# Patient Record
Sex: Male | Born: 1985 | Race: White | Hispanic: No | Smoking: Never smoker
Health system: Southern US, Academic
[De-identification: ages and names within clinical notes are randomized; demographics above are authoritative.]

## PROBLEM LIST (undated history)

## (undated) DIAGNOSIS — E782 Mixed hyperlipidemia: Secondary | ICD-10-CM

## (undated) DIAGNOSIS — F411 Generalized anxiety disorder: Secondary | ICD-10-CM

## (undated) DIAGNOSIS — F209 Schizophrenia, unspecified: Secondary | ICD-10-CM

## (undated) DIAGNOSIS — F319 Bipolar disorder, unspecified: Secondary | ICD-10-CM

## (undated) DIAGNOSIS — R4183 Borderline intellectual functioning: Secondary | ICD-10-CM

## (undated) DIAGNOSIS — K219 Gastro-esophageal reflux disease without esophagitis: Secondary | ICD-10-CM

## (undated) DIAGNOSIS — F259 Schizoaffective disorder, unspecified: Secondary | ICD-10-CM

## (undated) DIAGNOSIS — R569 Unspecified convulsions: Secondary | ICD-10-CM

---
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
## 2021-07-29 ENCOUNTER — Ambulatory Visit (HOSPITAL_COMMUNITY)
Admission: EM | Admit: 2021-07-29 | Discharge: 2021-07-30 | Disposition: A | Discharge status: Other Acute Inpatient Hospital | Payer: No Typology Code available for payment source | Attending: Student | Admitting: Student

## 2021-07-29 DIAGNOSIS — F603 Borderline personality disorder: Secondary | ICD-10-CM | POA: Insufficient documentation

## 2021-07-29 DIAGNOSIS — Z79899 Other long term (current) drug therapy: Secondary | ICD-10-CM | POA: Insufficient documentation

## 2021-07-29 DIAGNOSIS — K5900 Constipation, unspecified: Secondary | ICD-10-CM | POA: Insufficient documentation

## 2021-07-29 DIAGNOSIS — X58XXXA Exposure to other specified factors, initial encounter: Secondary | ICD-10-CM | POA: Insufficient documentation

## 2021-07-29 DIAGNOSIS — F419 Anxiety disorder, unspecified: Secondary | ICD-10-CM | POA: Insufficient documentation

## 2021-07-29 DIAGNOSIS — F323 Major depressive disorder, single episode, severe with psychotic features: Secondary | ICD-10-CM | POA: Insufficient documentation

## 2021-07-29 DIAGNOSIS — R45850 Homicidal ideations: Secondary | ICD-10-CM | POA: Insufficient documentation

## 2021-07-29 DIAGNOSIS — K219 Gastro-esophageal reflux disease without esophagitis: Secondary | ICD-10-CM | POA: Insufficient documentation

## 2021-07-29 DIAGNOSIS — Z560 Unemployment, unspecified: Secondary | ICD-10-CM | POA: Insufficient documentation

## 2021-07-29 DIAGNOSIS — R52 Pain, unspecified: Secondary | ICD-10-CM | POA: Insufficient documentation

## 2021-07-29 DIAGNOSIS — G4700 Insomnia, unspecified: Secondary | ICD-10-CM | POA: Insufficient documentation

## 2021-07-29 DIAGNOSIS — E7800 Pure hypercholesterolemia, unspecified: Secondary | ICD-10-CM | POA: Insufficient documentation

## 2021-07-29 DIAGNOSIS — Z9151 Personal history of suicidal behavior: Secondary | ICD-10-CM | POA: Insufficient documentation

## 2021-07-29 DIAGNOSIS — E559 Vitamin D deficiency, unspecified: Secondary | ICD-10-CM | POA: Insufficient documentation

## 2021-07-29 DIAGNOSIS — R45851 Suicidal ideations: Secondary | ICD-10-CM | POA: Insufficient documentation

## 2021-07-29 DIAGNOSIS — T189XXA Foreign body of alimentary tract, part unspecified, initial encounter: Secondary | ICD-10-CM | POA: Insufficient documentation

## 2021-07-29 DIAGNOSIS — K30 Functional dyspepsia: Secondary | ICD-10-CM | POA: Insufficient documentation

## 2021-07-29 DIAGNOSIS — Z20822 Contact with and (suspected) exposure to covid-19: Secondary | ICD-10-CM | POA: Insufficient documentation

## 2021-07-29 NOTE — Progress Notes (Signed)
°   07/29/21 2251  [facility] Triage Screening (Walk-ins at [facility] only)  How Did You Hear About [place]? Legal System  What Is the Reason for Your Visit/Call Today? Louis Turner is a 36 yo male transported to [facility] by [employer] for evaluation of suicidal ideation, homicidal ideation, and hearing voices telling him to kill others, eat them, then kill himself.  Pt reports that he has been having these thoughts and has plans of cutting his throat, jumping in front of a car.  Pt reports that the symptoms have escalated in the past couple of hours.  Pt denies any current substance use.  How Long Has This Been Causing You Problems? <Week  Have You Recently Had Any Thoughts About Hurting Yourself? Yes  How long ago did you have thoughts about hurting yourself? today--with plans to cut my throat or jump in traffic  Are You Planning to Commit Suicide/Harm Yourself At This time? Yes  Have you Recently Had Thoughts About Hurting Someone Karolee Ohs? Yes  How long ago did you have thoughts of harming others? today--"voices are telling me to kill and eat other people"  Are You Planning To Harm Someone At This Time? Yes  Are you currently experiencing any auditory, visual or other hallucinations? Yes  Please explain the hallucinations you are currently experiencing: auditory: command hallucinations telling patient to kill others, eat them, kill himself  Have You Used Any Alcohol or Drugs in the Past 24 Hours? No  Do you have any current medical co-morbidities that require immediate attention? No  What Do You Feel Would Help You the Most Today? Treatment for Depression or other mood problem  If access to [facility] was not available, would you have sought care in the Emergency Department? Yes  Determination of Need Emergent (2 hours)  Options For Referral Inpatient Hospitalization;BH Urgent Care;Group Home

## 2021-07-29 NOTE — BH Assessment (Signed)
Regarding Kassen Fine:   @ 2245, Received a call from [place] provider Virgel Gess [place]) [phone] (Address: [address], [place], [place]). States that Durrel Ostrom is in route to the [facility], being transported by [employer]. Explains that patient has been living in his AFL, 7 days. Kristopher assumed care of patient, after he was discharged from [place] is concerned that Aditya was not prescribed any "behavior medications" when discharged from [place] (7 days ago). It's unclear if patient has a local provider to manage his medications.   Today, patient called 911 from the [facility], told the dispatcher that he was suicidal with plan, refused to disclose plan when asked, abruptly hung up the phone as the dispatcher tried to ask more questions. Patient then nonchalantly stated, "I'm going to bed". Because patient hung up on the dispatcher, police were dispatched to the home to complete a welfare check, after talking to patient decided that it was best to bring him to the [facility].   The AFL provider states, "I don't think patient is at all suicidal, he is testing me, he is new, still adjusting, possible attention seeking behavior to being in a new environment." The provider has no issues picking patient up and taking him back to the AFL when psych cleared.  States that he sent all of patient's medications to the [facility] with patient/[employer]. The provider welcomes any phone calls for additional questions. Currently implementing a safety plan for patient's return back to the AFL.

## 2021-07-30 ENCOUNTER — Other Ambulatory Visit: Payer: Self-pay

## 2021-07-30 ENCOUNTER — Emergency Department (HOSPITAL_COMMUNITY)
Admission: EM | Admit: 2021-07-30 | Discharge: 2021-07-30 | Payer: Medicaid Other | Attending: Emergency Medicine | Admitting: Emergency Medicine

## 2021-07-30 ENCOUNTER — Emergency Department (HOSPITAL_COMMUNITY): Payer: Medicaid Other

## 2021-07-30 ENCOUNTER — Encounter (HOSPITAL_COMMUNITY): Payer: Self-pay | Admitting: Emergency Medicine

## 2021-07-30 DIAGNOSIS — Z20822 Contact with and (suspected) exposure to covid-19: Secondary | ICD-10-CM | POA: Diagnosis not present

## 2021-07-30 DIAGNOSIS — Z91040 Latex allergy status: Secondary | ICD-10-CM | POA: Insufficient documentation

## 2021-07-30 DIAGNOSIS — R079 Chest pain, unspecified: Secondary | ICD-10-CM | POA: Insufficient documentation

## 2021-07-30 DIAGNOSIS — R45851 Suicidal ideations: Secondary | ICD-10-CM | POA: Diagnosis not present

## 2021-07-30 DIAGNOSIS — T189XXA Foreign body of alimentary tract, part unspecified, initial encounter: Secondary | ICD-10-CM

## 2021-07-30 DIAGNOSIS — Z79899 Other long term (current) drug therapy: Secondary | ICD-10-CM | POA: Insufficient documentation

## 2021-07-30 LAB — CBC
HCT: 45.2 % (ref 39.0–52.0)
Hemoglobin: 14.9 g/dL (ref 13.0–17.0)
MCH: 28.6 pg (ref 26.0–34.0)
MCHC: 33 g/dL (ref 30.0–36.0)
MCV: 86.8 fL (ref 80.0–100.0)
Platelets: 269 10*3/uL (ref 150–400)
RBC: 5.21 MIL/uL (ref 4.22–5.81)
RDW: 13.2 % (ref 11.5–15.5)
WBC: 6.1 10*3/uL (ref 4.0–10.5)
nRBC: 0 % (ref 0.0–0.2)

## 2021-07-30 LAB — COMPREHENSIVE METABOLIC PANEL
ALT: 28 U/L (ref 0–44)
AST: 23 U/L (ref 15–41)
Albumin: 3.6 g/dL (ref 3.5–5.0)
Alkaline Phosphatase: 76 U/L (ref 38–126)
Anion gap: 9 (ref 5–15)
BUN: 13 mg/dL (ref 6–20)
CO2: 26 mmol/L (ref 22–32)
Calcium: 9.3 mg/dL (ref 8.9–10.3)
Chloride: 107 mmol/L (ref 98–111)
Creatinine, Ser: 0.93 mg/dL (ref 0.61–1.24)
GFR, Estimated: >60 mL/min (ref >60)
Glucose, Bld: 132 mg/dL — ABNORMAL HIGH (ref 70–99)
Potassium: 3.3 mmol/L — ABNORMAL LOW (ref 3.5–5.1)
Sodium: 142 mmol/L (ref 135–145)
Total Bilirubin: 0.2 mg/dL — ABNORMAL LOW (ref 0.3–1.2)
Total Protein: 6.4 g/dL — ABNORMAL LOW (ref 6.5–8.1)

## 2021-07-30 LAB — ETHANOL
Alcohol, Ethyl (B): <10 mg/dL (ref <10)
Alcohol, Ethyl (B): <10 mg/dL (ref <10)

## 2021-07-30 LAB — CBC WITH DIFFERENTIAL/PLATELET
Abs Immature Granulocytes: 0.02 10*3/uL (ref 0.00–0.07)
Basophils Absolute: 0 10*3/uL (ref 0.0–0.1)
Basophils Relative: 0 %
Eosinophils Absolute: 0 10*3/uL (ref 0.0–0.5)
Eosinophils Relative: 1 %
HCT: 41.8 % (ref 39.0–52.0)
Hemoglobin: 13.7 g/dL (ref 13.0–17.0)
Immature Granulocytes: 0 %
Lymphocytes Relative: 50 %
Lymphs Abs: 2.9 10*3/uL (ref 0.7–4.0)
MCH: 28.6 pg (ref 26.0–34.0)
MCHC: 32.8 g/dL (ref 30.0–36.0)
MCV: 87.3 fL (ref 80.0–100.0)
Monocytes Absolute: 0.4 10*3/uL (ref 0.1–1.0)
Monocytes Relative: 6 %
Neutro Abs: 2.6 10*3/uL (ref 1.7–7.7)
Neutrophils Relative %: 43 %
Platelets: 228 10*3/uL (ref 150–400)
RBC: 4.79 MIL/uL (ref 4.22–5.81)
RDW: 13.2 % (ref 11.5–15.5)
WBC: 5.9 10*3/uL (ref 4.0–10.5)
nRBC: 0 % (ref 0.0–0.2)

## 2021-07-30 LAB — POCT URINE DRUG SCREEN - MANUAL ENTRY (I-SCREEN)
POC Amphetamine UR: NOT DETECTED
POC Buprenorphine (BUP): NOT DETECTED
POC Cocaine UR: NOT DETECTED
POC Marijuana UR: NOT DETECTED
POC Methadone UR: NOT DETECTED
POC Methamphetamine UR: NOT DETECTED
POC Morphine: NOT DETECTED
POC Oxazepam (BZO): NOT DETECTED
POC Oxycodone UR: NOT DETECTED
POC Secobarbital (BAR): NOT DETECTED

## 2021-07-30 LAB — BASIC METABOLIC PANEL
Anion gap: 10 (ref 5–15)
BUN: 11 mg/dL (ref 6–20)
CO2: 23 mmol/L (ref 22–32)
Calcium: 9.5 mg/dL (ref 8.9–10.3)
Chloride: 106 mmol/L (ref 98–111)
Creatinine, Ser: 0.89 mg/dL (ref 0.61–1.24)
GFR, Estimated: >60 mL/min (ref >60)
Glucose, Bld: 118 mg/dL — ABNORMAL HIGH (ref 70–99)
Potassium: 4.2 mmol/L (ref 3.5–5.1)
Sodium: 139 mmol/L (ref 135–145)

## 2021-07-30 LAB — RESP PANEL BY RT-PCR (FLU A&B, COVID) ARPGX2
Influenza A by PCR: NEGATIVE
Influenza A by PCR: NEGATIVE
Influenza B by PCR: NEGATIVE
Influenza B by PCR: NEGATIVE
SARS Coronavirus 2 by RT PCR: NEGATIVE
SARS Coronavirus 2 by RT PCR: NEGATIVE

## 2021-07-30 LAB — LIPID PANEL
Cholesterol: 144 mg/dL (ref 0–200)
HDL: 33 mg/dL — ABNORMAL LOW (ref >40)
LDL Cholesterol: 38 mg/dL (ref 0–99)
Total CHOL/HDL Ratio: 4.4 RATIO
Triglycerides: 365 mg/dL — ABNORMAL HIGH (ref <150)
VLDL: 73 mg/dL — ABNORMAL HIGH (ref 0–40)

## 2021-07-30 LAB — VALPROIC ACID LEVEL: Valproic Acid Lvl: 72 ug/mL (ref 50.0–100.0)

## 2021-07-30 LAB — HEMOGLOBIN A1C
Hgb A1c MFr Bld: 5.4 % (ref 4.8–5.6)
Mean Plasma Glucose: 108.28 mg/dL

## 2021-07-30 LAB — POC SARS CORONAVIRUS 2 AG: SARSCOV2ONAVIRUS 2 AG: NEGATIVE

## 2021-07-30 LAB — TROPONIN I (HIGH SENSITIVITY): Troponin I (High Sensitivity): 3 ng/L (ref <18)

## 2021-07-30 LAB — TSH: TSH: 2.401 u[IU]/mL (ref 0.350–4.500)

## 2021-07-30 LAB — POC SARS CORONAVIRUS 2 AG -  ED: SARS Coronavirus 2 Ag: NEGATIVE

## 2021-07-30 MED ORDER — VITAMIN D3 50 MCG (2000 UT) PO TABS: 2000.0000 [IU] | ORAL_TABLET | Freq: Every day | ORAL | Status: DC

## 2021-07-30 MED ORDER — POTASSIUM CHLORIDE CRYS ER 20 MEQ PO TBCR
40.0000 meq | EXTENDED_RELEASE_TABLET | Freq: Once | ORAL | Status: AC
  Administered 2021-07-30: 40 meq via ORAL
  Filled 2021-07-30: qty 2

## 2021-07-30 MED ORDER — POTASSIUM CHLORIDE CRYS ER 20 MEQ PO TBCR: 20.0000 meq | EXTENDED_RELEASE_TABLET | Freq: Once | ORAL | Status: DC

## 2021-07-30 MED ORDER — PANTOPRAZOLE SODIUM 40 MG PO TBEC
80.0000 mg | DELAYED_RELEASE_TABLET | Freq: Every day | ORAL | Status: DC
  Administered 2021-07-30: 80 mg via ORAL
  Filled 2021-07-30: qty 2

## 2021-07-30 MED ORDER — TRAZODONE HCL 50 MG PO TABS
50.0000 mg | ORAL_TABLET | Freq: Every day | ORAL | Status: DC
Start: 2021-07-30 — End: 2021-07-30

## 2021-07-30 MED ORDER — ALUM & MAG HYDROXIDE-SIMETH 200-200-20 MG/5ML PO SUSP: 30.0000 mL | ORAL | Status: DC | PRN

## 2021-07-30 MED ORDER — ACETAMINOPHEN 325 MG PO TABS: 650.0000 mg | ORAL_TABLET | Freq: Four times a day (QID) | ORAL | Status: DC | PRN

## 2021-07-30 MED ORDER — DIVALPROEX SODIUM ER 500 MG PO TB24
500.0000 mg | ORAL_TABLET | Freq: Every day | ORAL | Status: DC
Start: 2021-07-30 — End: 2021-07-30
  Administered 2021-07-30: 500 mg via ORAL
  Filled 2021-07-30: qty 1

## 2021-07-30 MED ORDER — ALUM & MAG HYDROXIDE-SIMETH 200-200-20 MG/5ML PO SUSP
30.0000 mL | Freq: Once | ORAL | Status: AC
  Administered 2021-07-30: 30 mL via ORAL
  Filled 2021-07-30: qty 30

## 2021-07-30 MED ORDER — VITAMIN D 25 MCG (1000 UNIT) PO TABS
2000.0000 [IU] | ORAL_TABLET | Freq: Every day | ORAL | Status: DC
  Administered 2021-07-30: 2000 [IU] via ORAL
  Filled 2021-07-30: qty 2

## 2021-07-30 MED ORDER — BENZTROPINE MESYLATE 1 MG PO TABS
1.0000 mg | ORAL_TABLET | Freq: Two times a day (BID) | ORAL | Status: DC
  Administered 2021-07-30: 1 mg via ORAL
  Filled 2021-07-30: qty 1

## 2021-07-30 MED ORDER — CLONAZEPAM 1 MG PO TABS
1.0000 mg | ORAL_TABLET | Freq: Two times a day (BID) | ORAL | Status: DC
  Administered 2021-07-30: 1 mg via ORAL
  Filled 2021-07-30: qty 1

## 2021-07-30 MED ORDER — DIVALPROEX SODIUM ER 500 MG PO TB24: 1000.0000 mg | ORAL_TABLET | Freq: Every day | ORAL | Status: DC

## 2021-07-30 MED ORDER — MAGNESIUM HYDROXIDE 400 MG/5ML PO SUSP: 30.0000 mL | Freq: Every day | ORAL | Status: DC | PRN

## 2021-07-30 MED ORDER — LIDOCAINE VISCOUS HCL 2 % MT SOLN
15.0000 mL | Freq: Once | OROMUCOSAL | Status: AC
  Administered 2021-07-30: 15 mL via ORAL
  Filled 2021-07-30: qty 15

## 2021-07-30 MED ORDER — HYDROXYZINE HCL 25 MG PO TABS
100.0000 mg | ORAL_TABLET | Freq: Every day | ORAL | Status: DC
Start: 2021-07-30 — End: 2021-07-30

## 2021-07-30 MED ORDER — ATORVASTATIN CALCIUM 10 MG PO TABS: 20.0000 mg | ORAL_TABLET | Freq: Every day | ORAL | Status: DC

## 2021-07-30 NOTE — ED Provider Notes (Signed)
Behavioral Health Progress Note  Date and Time: 07/30/2021 6:41 PM Name: Louis Turner MRN:  [id number]  Subjective:   Louis Turner is a 36 year old male with reported past psychiatric history of borderline personality disorder (reported by patient's ALF provider, no past psychiatric diagnoses present in patient's chart at this time) who presents to the [facility] behavioral health urgent care [facility]) from his ALF via [place] enforcement voluntarily for walk-in evaluation.  Patient is currently a resident at an [place] [facility] (Address: [address], [place], [place]) and has a legal guardian Claria Dice ([place]): [phone]). He presented for command auditory hallucinations and suicidal thoughts.  On interview and assessment this morning, the patient has a bizarre appearance with a depressed affect.  He is able to report that he lives in an assisted living facility, being there for the past week.  He reports liking where he lives.  Unfortunately, he reports that he has command auditory hallucinations that make him homicidal and suicidal.  He reports that these take the form of voices of people he knows telling him to "burn down the house, kill yourself, and kill other people".  He reports the medications do not work for him.  He reports a recent hospitalization at [facility], which the patient reports was only marginally helpful.  The patient is fully alert and oriented but he is unable to solve a simple math problem.  Diagnosis:  Final diagnoses:  MDD (major depressive disorder), single episode, severe with psychotic features ([place])  Suicidal ideation  Homicidal ideation    Total Time spent with patient: 20 minutes  Past Psychiatric History: reported past psychiatric history of borderline personality disorder (reported by patient's ALF provider, no past psychiatric diagnoses present in patient's chart at this time).  See HPI for further details regarding patient's past psychiatric  history. Past Medical History: History reviewed. No pertinent past medical history. History reviewed. No pertinent surgical history. Family History: History reviewed. No pertinent family history. Family Psychiatric  History: denies Hx of major mental illness Social History:  Social History   Substance and Sexual Activity  Alcohol Use None     Social History   Substance and Sexual Activity  Drug Use Not on file    Social History   Socioeconomic History   Marital status: Single    Spouse name: Not on file   Number of children: Not on file   Years of education: Not on file   Highest education level: Not on file  Occupational History   Not on file  Tobacco Use   Smoking status: Unknown   Smokeless tobacco: Not on file  Substance and Sexual Activity   Alcohol use: Not on file   Drug use: Not on file   Sexual activity: Not on file  Other Topics Concern   Not on file  Social History Narrative   Not on file   Social Determinants of Health   Financial Resource Strain: Not on file  Food Insecurity: Not on file  Transportation Needs: Not on file  Physical Activity: Not on file  Stress: Not on file  Social Connections: Not on file   SDOH:  SDOH Screenings   Alcohol Screen: Not on file  Depression [id number]): Not on file  Financial Resource Strain: Not on file  Food Insecurity: Not on file  Housing: Not on file  Physical Activity: Not on file  Social Connections: Not on file  Stress: Not on file  Tobacco Use: Not on file  Transportation Needs: Not on file  Additional Social History:    Pain Medications: see MAR Prescriptions: see MAR Over the Counter: see MAR History of alcohol / drug use?: No history of alcohol / drug abuse      Sleep: Fair  Appetite:  Fair  Current Medications:  Current Facility-Administered Medications  Medication Dose Route Frequency Provider Last Rate Last Admin   acetaminophen (TYLENOL) tablet 650 mg  650 mg Oral Q6H PRN Jaclyn Shaggy, PA-C       alum & mag hydroxide-simeth (MAALOX/MYLANTA) 200-200-20 MG/5ML suspension 30 mL  30 mL Oral Q4H PRN Melbourne Abts W, PA-C       atorvastatin (LIPITOR) tablet 20 mg  20 mg Oral QHS Ladona Ridgel, Cody W, PA-C       benztropine (COGENTIN) tablet 1 mg  1 mg Oral BID Melbourne Abts W, PA-C   1 mg at 07/30/21 1105   cholecalciferol (VITAMIN D3) tablet 2,000 Units  2,000 Units Oral Daily Jaclyn Shaggy, PA-C   2,000 Units at 07/30/21 1104   clonazePAM (KLONOPIN) tablet 1 mg  1 mg Oral BID Melbourne Abts W, PA-C   1 mg at 07/30/21 1105   divalproex (DEPAKOTE ER) 24 hr tablet 1,000 mg  1,000 mg Oral QHS Ladona Ridgel, Cody W, PA-C       divalproex (DEPAKOTE ER) 24 hr tablet 500 mg  500 mg Oral Daily Melbourne Abts W, PA-C   500 mg at 07/30/21 1548   hydrOXYzine (ATARAX) tablet 100 mg  100 mg Oral QHS Melbourne Abts W, PA-C       magnesium hydroxide (MILK OF MAGNESIA) suspension 30 mL  30 mL Oral Daily PRN Melbourne Abts W, PA-C       pantoprazole (PROTONIX) EC tablet 80 mg  80 mg Oral Daily Melbourne Abts W, PA-C   80 mg at 07/30/21 1104   traZODone (DESYREL) tablet 50 mg  50 mg Oral QHS Jaclyn Shaggy, PA-C       Current Outpatient Medications  Medication Sig Dispense Refill   Cholecalciferol (VITAMIN D3) 50 MCG (2000 UT) TABS Take 2,000 Int'l Units by mouth daily.     divalproex (DEPAKOTE ER) 500 MG 24 hr tablet Take 1,000 mg by mouth at bedtime.     Melatonin 10 MG TABS Take 10 mg by mouth at bedtime.     atorvastatin (LIPITOR) 20 MG tablet Take 20 mg by mouth at bedtime.     benztropine (COGENTIN) 1 MG tablet Take 1 mg by mouth 2 (two) times daily.     clonazePAM (KLONOPIN) 1 MG tablet Take 1 mg by mouth 2 (two) times daily.     divalproex (DEPAKOTE ER) 500 MG 24 hr tablet Take 500 mg by mouth daily.     hydrOXYzine (ATARAX) 50 MG tablet Take 100 mg by mouth at bedtime.     omeprazole (PRILOSEC) 40 MG capsule Take 40 mg by mouth every morning.     traZODone (DESYREL) 50 MG tablet Take 50 mg by mouth at  bedtime.      Labs  Lab Results:  Admission on 07/29/2021  Component Date Value Ref Range Status   SARS Coronavirus 2 by RT PCR 07/30/2021 NEGATIVE  NEGATIVE Final   Comment: (NOTE) SARS-CoV-2 target nucleic acids are NOT DETECTED.  The SARS-CoV-2 RNA is generally detectable in upper respiratory specimens during the acute phase of infection. The lowest concentration of SARS-CoV-2 viral copies this assay can detect is 138 copies/mL. A negative result does not preclude SARS-Cov-2 infection and should not  be used as the sole basis for treatment or other patient management decisions. A negative result may occur with  improper specimen collection/handling, submission of specimen other than nasopharyngeal swab, presence of viral mutation(s) within the areas targeted by this assay, and inadequate number of viral copies(<138 copies/mL). A negative result must be combined with clinical observations, patient history, and epidemiological information. The expected result is Negative.  Fact Sheet for Patients:  EntrepreneurPulse.com.au  Fact Sheet for Healthcare Providers:  IncredibleEmployment.be  This test is no                          t yet approved or cleared by the [place] FDA and  has been authorized for detection and/or diagnosis of SARS-CoV-2 by FDA under an Emergency Use Authorization (EUA). This EUA will remain  in effect (meaning this test can be used) for the duration of the COVID-19 declaration under Section 564(b)(1) of the Act, 21 U.S.C.section 360bbb-3(b)(1), unless the authorization is terminated  or revoked sooner.       Influenza A by PCR 07/30/2021 NEGATIVE  NEGATIVE Final   Influenza B by PCR 07/30/2021 NEGATIVE  NEGATIVE Final   Comment: (NOTE) The Xpert Xpress SARS-CoV-2/FLU/RSV plus assay is intended as an aid in the diagnosis of influenza from Nasopharyngeal swab specimens and should not be used as a sole basis for  treatment. Nasal washings and aspirates are unacceptable for Xpert Xpress SARS-CoV-2/FLU/RSV testing.  Fact Sheet for Patients: EntrepreneurPulse.com.au  Fact Sheet for Healthcare Providers: IncredibleEmployment.be  This test is not yet approved or cleared by the [place] FDA and has been authorized for detection and/or diagnosis of SARS-CoV-2 by FDA under an Emergency Use Authorization (EUA). This EUA will remain in effect (meaning this test can be used) for the duration of the COVID-19 declaration under Section 564(b)(1) of the Act, 21 U.S.C. section 360bbb-3(b)(1), unless the authorization is terminated or revoked.  Performed at [facility], [place] [address] [place], [place] [address]    SARS Coronavirus 2 Ag 07/30/2021 Negative  Negative Preliminary   WBC 07/30/2021 5.9  4.0 - 10.5 K/uL Final   RBC 07/30/2021 4.79  4.22 - 5.81 MIL/uL Final   Hemoglobin 07/30/2021 13.7  13.0 - 17.0 g/dL Final   HCT 07/30/2021 41.8  39.0 - 52.0 % Final   MCV 07/30/2021 87.3  80.0 - 100.0 fL Final   MCH 07/30/2021 28.6  26.0 - 34.0 pg Final   MCHC 07/30/2021 32.8  30.0 - 36.0 g/dL Final   RDW 07/30/2021 13.2  11.5 - 15.5 % Final   Platelets 07/30/2021 228  150 - 400 K/uL Final   nRBC 07/30/2021 0.0  0.0 - 0.2 % Final   Neutrophils Relative % 07/30/2021 43  % Final   Neutro Abs 07/30/2021 2.6  1.7 - 7.7 K/uL Final   Lymphocytes Relative 07/30/2021 50  % Final   Lymphs Abs 07/30/2021 2.9  0.7 - 4.0 K/uL Final   Monocytes Relative 07/30/2021 6  % Final   Monocytes Absolute 07/30/2021 0.4  0.1 - 1.0 K/uL Final   Eosinophils Relative 07/30/2021 1  % Final   Eosinophils Absolute 07/30/2021 0.0  0.0 - 0.5 K/uL Final   Basophils Relative 07/30/2021 0  % Final   Basophils Absolute 07/30/2021 0.0  0.0 - 0.1 K/uL Final   Immature Granulocytes 07/30/2021 0  % Final   Abs Immature Granulocytes 07/30/2021 0.02  0.00 - 0.07 K/uL Final   Performed  at [facility], [place] [address], [place], [place] [address]   Sodium 07/30/2021 142  135 - 145 mmol/L Final   Potassium 07/30/2021 3.3 (L)  3.5 - 5.1 mmol/L Final   Chloride 07/30/2021 107  98 - 111 mmol/L Final   CO2 07/30/2021 26  22 - 32 mmol/L Final   Glucose, Bld 07/30/2021 132 (H)  70 - 99 mg/dL Final   Glucose reference range applies only to samples taken after fasting for at least 8 hours.   BUN 07/30/2021 13  6 - 20 mg/dL Final   Creatinine, Ser 07/30/2021 0.93  0.61 - 1.24 mg/dL Final   Calcium 07/30/2021 9.3  8.9 - 10.3 mg/dL Final   Total Protein 07/30/2021 6.4 (L)  6.5 - 8.1 g/dL Final   Albumin 07/30/2021 3.6  3.5 - 5.0 g/dL Final   AST 07/30/2021 23  15 - 41 U/L Final   ALT 07/30/2021 28  0 - 44 U/L Final   Alkaline Phosphatase 07/30/2021 76  38 - 126 U/L Final   Total Bilirubin 07/30/2021 0.2 (L)  0.3 - 1.2 mg/dL Final   GFR, Estimated 07/30/2021 >60  >60 mL/min Final   Comment: (NOTE) Calculated using the CKD-EPI Creatinine Equation (2021)    Anion gap 07/30/2021 9  5 - 15 Final   Performed at [facility], [place] [address], [place], [place] [address]   Hgb A1c MFr Bld 07/30/2021 5.4  4.8 - 5.6 % Final   Comment: (NOTE) Pre diabetes:          5.7%-6.4%  Diabetes:              >6.4%  Glycemic control for   <7.0% adults with diabetes    Mean Plasma Glucose 07/30/2021 108.28  mg/dL Final   Performed at [facility], [place] [address], [place], [place] [address]   Alcohol, Ethyl (B) 07/30/2021 <10  <10 mg/dL Final   Comment: (NOTE) Lowest detectable limit for serum alcohol is 10 mg/dL.  For medical purposes only. Performed at [facility], [place] [address] [place], [place] [address]    Cholesterol 07/30/2021 144  0 - 200 mg/dL Final   Triglycerides 07/30/2021 365 (H)  <150 mg/dL Final   HDL 07/30/2021 33 (L)  >40 mg/dL Final   Total CHOL/HDL Ratio 07/30/2021 4.4  RATIO Final   VLDL 07/30/2021 73 (H)  0 - 40 mg/dL Final   LDL Cholesterol 07/30/2021  38  0 - 99 mg/dL Final   Comment:        Total Cholesterol/HDL:CHD Risk Coronary Heart Disease Risk Table                     Men   Women  1/2 Average Risk   3.4   3.3  Average Risk       5.0   4.4  2 X Average Risk   9.6   7.1  3 X Average Risk  23.4   11.0        Use the calculated Patient Ratio above and the CHD Risk Table to determine the patient's CHD Risk.        ATP III CLASSIFICATION (LDL):  <100     mg/dL   Optimal  100-129  mg/dL   Near or Above                    Optimal  130-159  mg/dL   Borderline  160-189  mg/dL   High  >190     mg/dL   Very High Performed at [facility] [address] [place], [place] [address]    TSH 07/30/2021 2.401  0.350 - 4.500 uIU/mL Final   Comment: Performed by a 3rd Generation assay with a functional sensitivity of <=0.01 uIU/mL. Performed at [facility], [place] [address] [place], [place] [address]    POC Amphetamine UR 07/30/2021 None Detected  NONE DETECTED (Cut Off Level 1000 ng/mL) Final   POC Secobarbital (BAR) 07/30/2021 None Detected  NONE DETECTED (Cut Off Level 300 ng/mL) Final   POC Buprenorphine (BUP) 07/30/2021 None Detected  NONE DETECTED (Cut Off Level 10 ng/mL) Final   POC Oxazepam (BZO) 07/30/2021 None Detected  NONE DETECTED (Cut Off Level 300 ng/mL) Final   POC Cocaine UR 07/30/2021 None Detected  NONE DETECTED (Cut Off Level 300 ng/mL) Final   POC Methamphetamine UR 07/30/2021 None Detected  NONE DETECTED (Cut Off Level 1000 ng/mL) Final   POC Morphine 07/30/2021 None Detected  NONE DETECTED (Cut Off Level 300 ng/mL) Final   POC Oxycodone UR 07/30/2021 None Detected  NONE DETECTED (Cut Off Level 100 ng/mL) Final   POC Methadone UR 07/30/2021 None Detected  NONE DETECTED (Cut Off Level 300 ng/mL) Final   POC Marijuana UR 07/30/2021 None Detected  NONE DETECTED (Cut Off Level 50 ng/mL) Final   SARSCOV2ONAVIRUS 2 AG 07/30/2021 NEGATIVE  NEGATIVE Final   Comment: (NOTE) SARS-CoV-2 antigen NOT DETECTED.    Negative results are presumptive.  Negative results do not preclude SARS-CoV-2 infection and should not be used as the sole basis for treatment or other patient management decisions, including infection  control decisions, particularly in the presence of clinical signs and  symptoms consistent with COVID-19, or in those who have been in contact with the virus.  Negative results must be combined with clinical observations, patient history, and epidemiological information. The expected result is Negative.  Fact Sheet for Patients: HandmadeRecipes.com.cy  Fact Sheet for Healthcare Providers: FuneralLife.at  This test is not yet approved or cleared by the [place] FDA and  has been authorized for detection and/or diagnosis of SARS-CoV-2 by FDA under an Emergency Use Authorization (EUA).  This EUA will remain in effect (meaning this test can be used) for the duration of  the COV                          ID-19 declaration under Section 564(b)(1) of the Act, 21 U.S.C. section 360bbb-3(b)(1), unless the authorization is terminated or revoked sooner.     Valproic Acid Lvl 07/30/2021 72  50.0 - 100.0 ug/mL Final   Performed at [facility], [place] [address], [place],  [address]    Blood Alcohol level:  Lab Results  Component Value Date   ETH <10 123XX123    Metabolic Disorder Labs: Lab Results  Component Value Date   HGBA1C 5.4 07/30/2021   MPG 108.28 07/30/2021   No results found for: PROLACTIN Lab Results  Component Value Date   CHOL 144 07/30/2021   TRIG 365 (H) 07/30/2021   HDL 33 (L) 07/30/2021   CHOLHDL 4.4 07/30/2021   VLDL 73 (H) 07/30/2021   LDLCALC 38 07/30/2021    Therapeutic Lab Levels: No results found for: LITHIUM Lab Results  Component Value Date   VALPROATE 72 07/30/2021   No components found for:  CBMZ  Physical Findings  [place] [facility] from 07/29/2021 in [facility]        Musculoskeletal  Strength & Muscle Tone: within normal limits Gait & Station: normal Patient leans: N/A  Psychiatric Specialty Exam  Presentation  General Appearance: Appropriate for Environment; Bizarre  Eye Contact:Fleeting  Speech:Clear and Coherent  Speech Volume:Normal  Handedness:No data recorded  Mood and Affect  Mood:Depressed  Affect:Congruent   Thought Process  Thought Processes:Coherent  Descriptions of Associations:Intact  Orientation:Full (Time, Place and Person)  Thought Content:Logical   Duration of Psychotic Symptoms: Greater than six months   Hallucinations: as described in subjective above Ideas of Reference:None  Suicidal Thoughts:Suicidal Thoughts: Yes, Active SI Active Intent and/or Plan: Without Intent; With Plan; Without Means to Carry Out  Homicidal Thoughts:Homicidal Thoughts: Yes, Active HI Active Intent and/or Plan: Without Intent; With Plan; Without Means to [place]; Recent Fair; Remote Fair  Judgment:Poor  Insight:Poor   Executive Functions  Concentration:Poor  Attention Span:Poor  Recall:Poor  Fund of Knowledge:Poor  Language:Poor   Psychomotor Activity  Psychomotor Activity:Psychomotor Activity: Normal   Assets  Assets:Social Support; Physical Health; Housing   Sleep  Sleep:Sleep: Fair   Nutritional Assessment (For OBS and FBC admissions only) Has the patient had a weight loss or gain of 10 pounds or more in the last 3 months?: No Has the patient had a decrease in food intake/or appetite?: No Does the patient have dental problems?: No Does the patient have eating habits or behaviors that may be indicators of an eating disorder including binging or inducing vomiting?: No Has the patient recently lost weight without trying?: 0 Has the patient been eating poorly because of a decreased appetite?:  0 Malnutrition Screening Tool Score: 0    Physical Exam  Physical Exam Vitals reviewed.  Constitutional:      Appearance: He is not toxic-appearing.  Pulmonary:     Effort: Pulmonary effort is normal.  Neurological:     General: No focal deficit present.     Mental Status: He is alert and oriented to person, place, and time.   Review of Systems  Respiratory:  Negative for shortness of breath.   Cardiovascular:  Negative for chest pain.  Gastrointestinal:  Negative for constipation, diarrhea, nausea and vomiting.  Neurological:  Negative for headaches.  Psychiatric/Behavioral:  Positive for depression and suicidal ideas.   Blood pressure 137/86, pulse 96, temperature 98.4 F (36.9 C), temperature source Oral, resp. rate 18, SpO2 98 %. There is no height or weight on file to calculate BMI.  Treatment Plan Summary: Daily contact with patient to assess and evaluate symptoms and progress in treatment and Medication management  Will continue the following home medications at this time:             -Lipitor 20 mg p.o. daily at bedtime for hypercholesterolemia             -Cogentin 1 mg p.o. twice daily for potential EPS              -Klonopin 1 mg p.o. twice daily for anxiety             -Depakote ER 500 mg p.o. daily/every morning for mood stability             -Depakote ER 1000 mg p.o. daily at bedtime for mood stability   -VPA level of 72  -Patient does not appear to have a home  antipsychotic, medication bottles checked and ALF leader contacted but no AP med found.             -Hydroxyzine 100 mg p.o. daily at bedtime for anxiety             -Trazodone 50 mg p.o. daily at bedtime for insomnia             -Vitamin D3 2000 units p.o. daily for vitamin D deficiency             -Protonix 80 mg p.o. daily for GERD (formulary alternative the patient's home medication of omeprazole 40 mg p.o. daily)   Additional as needed medications ordered:             -Tylenol 650 mg p.o. every 6  hours as needed for mild pain             -Maalox/Mylanta 30 mL p.o. every 4 hours as needed for indigestion             -Milk of Magnesia 30 mL p.o. daily as needed for mild constipation  Medical Management Covid negative CMP: K of 3.3 replaced with 40 meq CBC: unremarkable EtOH: <10 UDS: negative TSH: normal A1C: normal, BMI WNL Lipids: TG of 365, HDL of 33 EKG: NSR, Qtc of 428  Corky Sox, MD 07/30/2021 6:41 PM

## 2021-07-30 NOTE — ED Notes (Signed)
Pt A&O x 4, [facility] transfer, presents with suicidal ideations, plan to cut throat, jump in front of car, kill & eat other people.  Pt sleepy, calm & cooperative.  Comfort measures given.  Monitoring for safety.

## 2021-07-30 NOTE — ED Provider Notes (Signed)
[facility] EMERGENCY DEPARTMENT Provider Note   CSN: [id number] Arrival date & time: 07/30/21  2100     History  Chief Complaint  Patient presents with   Chest Pain    Derk Doubek is a 36 y.o. male history of borderline personality disorder here presenting with thoughts of harming himself.  Patient went to behavioral health urgent care yesterday and had thoughts of harming himself.  He apparently swallowed a piece of plastic spoon and was sent here for further evaluation.  He initially told him that he had some shortness of breath or abdominal pain.  But however he denies that to me right now.  The history is provided by the patient.      Home Medications Prior to Admission medications   Medication Sig Start Date End Date Taking? Authorizing Provider  atorvastatin (LIPITOR) 20 MG tablet Take 20 mg by mouth at bedtime. 07/28/21   [provider]  benztropine (COGENTIN) 1 MG tablet Take 1 mg by mouth 2 (two) times daily. 07/28/21   [provider]  Cholecalciferol (VITAMIN D3) 50 MCG (2000 UT) TABS Take 2,000 Int'l Units by mouth daily.    [provider]  clonazePAM (KLONOPIN) 1 MG tablet Take 1 mg by mouth 2 (two) times daily. 07/28/21   [provider]  divalproex (DEPAKOTE ER) 500 MG 24 hr tablet Take 500 mg by mouth daily. 07/28/21   [provider]  divalproex (DEPAKOTE ER) 500 MG 24 hr tablet Take 1,000 mg by mouth at bedtime.    [provider]  hydrOXYzine (ATARAX) 50 MG tablet Take 100 mg by mouth at bedtime. 07/28/21   [provider]  Melatonin 10 MG TABS Take 10 mg by mouth at bedtime.    [provider]  omeprazole (PRILOSEC) 40 MG capsule Take 40 mg by mouth every morning. 07/28/21   [provider]  traZODone (DESYREL) 50 MG tablet Take 50 mg by mouth at bedtime. 07/28/21   [provider]      Allergies    Latex    Review of Systems   Review of Systems   Cardiovascular:  Positive for chest pain.  All other systems reviewed and are negative.  Physical Exam Updated Vital Signs BP (!) 149/89 (BP Location: Right Arm)    Pulse (!) 110    Temp 97.9 F (36.6 C) (Oral)    Resp 18    SpO2 97%  Physical Exam Vitals and nursing note reviewed.  Constitutional:      Comments: Calm, NAD   HENT:     Head: Normocephalic.  Eyes:     Extraocular Movements: Extraocular movements intact.     Pupils: Pupils are equal, round, and reactive to light.  Cardiovascular:     Rate and Rhythm: Normal rate and regular rhythm.     Heart sounds: Normal heart sounds.  Pulmonary:     Effort: Pulmonary effort is normal.     Breath sounds: Normal breath sounds.  Abdominal:     General: Bowel sounds are normal.     Palpations: Abdomen is soft.  Musculoskeletal:        General: Normal range of motion.     Cervical back: Normal range of motion and neck supple.  Skin:    General: Skin is warm.  Neurological:     General: No focal deficit present.     Mental Status: He is oriented to person, place, and time.    ED Results / Procedures /  Treatments   Labs (all labs ordered are listed, but only abnormal results are displayed) Labs Reviewed  RESP PANEL BY RT-PCR (FLU A&B, COVID) ARPGX2  CBC  BASIC METABOLIC PANEL  ETHANOL  TROPONIN I (HIGH SENSITIVITY)    EKG None  Radiology No results found.  Procedures Procedures    Medications Ordered in ED Medications  alum & mag hydroxide-simeth (MAALOX/MYLANTA) 200-200-20 MG/5ML suspension 30 mL (has no administration in time range)    And  lidocaine (XYLOCAINE) 2 % viscous mouth solution 15 mL (has no administration in time range)    ED Course/ Medical Decision Making/ A&P                           Medical Decision Making Melquisedec Journey is a 36 y.o. male here from behavioral health urgent care after swallowing a piece of plastic spoon.  Patient appears well.  Abdomen is nontender and lungs are clear.   We will get a chest tube.  Abdominal x-ray.  I think even if he swallowed a small piece of plastic spoon, he has low chance of bowel perforation.    10:22 PM X-ray showed no pneumonia or perforation.  Labs unremarkable.  I discussed with behavioral health urgent care.  Patient stable to be transferred back to urgent care.   Amount and/or Complexity of Data Reviewed Labs: ordered. Radiology: ordered.  Risk OTC drugs. Prescription drug management.  Final Clinical Impression(s) / ED Diagnoses Final diagnoses:  None    Rx / DC Orders ED Discharge Orders     None         Charlynne Pander, MD 07/30/21 2231

## 2021-07-30 NOTE — ED Notes (Signed)
Pt sleeping@this time. Breathing even and unlabored. Will continue to monitor for safety 

## 2021-07-30 NOTE — ED Notes (Signed)
Pt was given a hygiene bag and new scrubs. He wanted to take a shower.

## 2021-07-30 NOTE — ED Notes (Signed)
Per [place] note, patient swallowed long end of plastic spoon, through round end in toilet and flushed.

## 2021-07-30 NOTE — ED Provider Triage Note (Addendum)
Emergency Medicine Provider Triage Evaluation Note  Louis Turner , a 36 y.o. male  was evaluated in triage.  Pt complains of chest pain. States that same came on suddenly earlier today after he was agitated. Pain located in the center of his chest and does not radiate. Also endorses some shortness of breath as well. Denies any leg pain or swelling. Presents from [facility], continues to have SI but without plan. Voluntary at [facility]. No hx cardiac problems  Of note, upon chart review it appears that patient swallowed the long end of a plastic spoon earlier today and began having chest pain soon after this. No nausea or vomiting  Review of Systems  Positive: Chest pain, shortness of breath Negative: Fever, chills, nausea, vomiting  Physical Exam  BP (!) 149/89 (BP Location: Right Arm)    Pulse (!) 110    Temp 97.9 F (36.6 C) (Oral)    Resp 18    SpO2 97%  Gen:   Awake, no distress   Resp:  Normal effort  MSK:   Moves extremities without difficulty  Other:    Medical Decision Making  Medically screening exam initiated at 9:15 PM.  Appropriate orders placed.  Louis Turner was informed that the remainder of the evaluation will be completed by another provider, this initial triage assessment does not replace that evaluation, and the importance of remaining in the ED until their evaluation is complete.     Louis Turner 07/30/21 2117    Silva Bandy, PA-C 07/30/21 2127

## 2021-07-30 NOTE — ED Notes (Signed)
Pt is sleeping quietly. Will continue to monitor.

## 2021-07-30 NOTE — ED Notes (Signed)
Pt calm and cooperative. Takes PO medications without issues. Allows blood draw. Given drink and informed on upcoming tests. Reports he wanted to hurt himself earlier, but denies these feelings now.

## 2021-07-30 NOTE — BH Assessment (Signed)
Comprehensive Clinical Assessment (CCA) Note  07/30/2021 Louis Turner [id number]  Disposition: Margorie John, PA, patient meets inpatient criteria. Disposition SW to secure placement.   The patient demonstrates the following risk factors for suicide: Chronic risk factors for suicide include: psychiatric disorder of depression . Acute risk factors for suicide include: N/A. Protective factors for this patient include: positive therapeutic relationship and hope for the future. Considering these factors, the overall suicide risk at this point appears to be high. Patient is not appropriate for outpatient follow up.  [facility] [facility] from 07/29/2021 in [place] is a 36 yo male transported to [facility] by [employer] for evaluation of suicidal ideation, homicidal ideation, and hearing voices telling him to kill others, eat them, then kill himself.  Patient reports that he has been having these thoughts and has plans of cutting his throat, jumping in front of a car.  Patient reports that the symptoms have escalated in the past couple of hours.  Patient denies any current substance use. Patient was lethargic and sleepy. TTS clinician and provider continued to wake patient to answer questions. Patient did not answer all questions due to current mental state. Patient gave permission for collateral contact. See below.   PER COLLATERAL CONTACT NOTE 07/29/21: Regarding Louis Turner:  @ 2245, Received a call from [facility] provider Virgel Gess [place]) [phone] (Address: [address], [place], [place]). States that Louis Turner is in route to the [facility], being transported by [employer]. Explains that patient has been living in his [facility], 7 days. Kristopher assumed care of patient, after he was discharged from [facility] is concerned that Byrne was not prescribed any "behavior medications" when discharged from [facility] (7 days  ago). It's unclear if patient has a local provider to manage his medications.  -Today, patient called 911 from the [facility], told the dispatcher that he was suicidal with plan, refused to disclose plan when asked, abruptly hung up the phone as the dispatcher tried to ask more questions. Patient then nonchalantly stated, "I'm going to bed". Because patient hung up on the dispatcher, police were dispatched to the home to complete a welfare check, after talking to patient decided that it was best to bring him to the [facility] [employer]  -The [facility] provider states, "I don't think patient is at all suicidal, he is testing me, he is new, still adjusting, possible attention seeking behavior to being in a new environment." The provider has no issues picking patient up and taking him back to the AFL when psych cleared.  States that he sent all of patient's medications to the [facility] with patient/[employer]. The provider welcomes any phone calls for additional questions. Currently implementing a safety plan for patient's return back to the [facility].    Chief Complaint:  Chief Complaint  Patient presents with   Suicidal   Homicidal   Hearing voices   Visit Diagnosis: Major depressive disorder Hx of borderline personality disorder per group home staff   CCA Screening, Triage and Referral (STR)  Patient Reported Information How did you hear about [place]? Legal System  What Is the Reason for Your Visit/Call Today? [name] is a 36 yo male transported to [facility] by [employer] for evaluation of suicidal ideation, homicidal ideation, and hearing voices telling him to kill others, eat them, then kill himself.  Pt reports that he has been having these thoughts and has plans of cutting his throat, jumping in front of a  car.  Pt reports that the symptoms have escalated in the past couple of hours.  Pt denies any current substance use.  How Long Has This Been Causing You Problems? <Week  What Do You Feel Would Help You the Most Today? Treatment for Depression or  other mood problem   Have You Recently Had Any Thoughts About Hurting Yourself? Yes  Are You Planning to Commit Suicide/Harm Yourself At This time? Yes   Have you Recently Had Thoughts About Hurting Someone Karolee Ohs? Yes  Are You Planning to Harm Someone at This Time? Yes  Explanation: No data recorded  Have You Used Any Alcohol or Drugs in the Past 24 Hours? No  How Long Ago Did You Use Drugs or Alcohol? No data recorded What Did You Use and How Much? No data recorded  Do You Currently Have a Therapist/Psychiatrist? No data recorded Name of Therapist/Psychiatrist: No data recorded  Have You Been Recently Discharged From Any Office Practice or Programs? No data recorded Explanation of Discharge From Practice/Program: No data recorded    CCA Screening Triage Referral Assessment Type of Contact: No data recorded Telemedicine Service Delivery:   Is this Initial or Reassessment? No data recorded Date Telepsych consult ordered in [facility]:  No data recorded Time Telepsych consult ordered in [facility]:  No data recorded Location of Assessment: No data recorded Provider Location: No data recorded  Collateral Involvement: No data recorded  Does Patient Have a Court Appointed Legal Guardian? No data recorded Name and Contact of Legal Guardian: No data recorded If Minor and Not Living with Parent(s), Who has Custody? No data recorded Is CPS involved or ever been involved? No data recorded Is APS involved or ever been involved? No data recorded  Patient Determined To Be At Risk for Harm To Self or Others Based on Review of Patient Reported Information or Presenting Complaint? No data recorded Method: No data recorded Availability of Means: No data recorded Intent: No data recorded Notification Required: No data recorded Additional Information for Danger to Others Potential: No data recorded Additional Comments for Danger to Others Potential: No data recorded Are There Guns or Other Weapons in  Your Home? No data recorded Types of Guns/Weapons: No data recorded Are These Weapons Safely Secured?                            No data recorded Who Could Verify You Are Able To Have These Secured: No data recorded Do You Have any Outstanding Charges, Pending Court Dates, Parole/Probation? No data recorded Contacted To Inform of Risk of Harm To Self or Others: No data recorded   Does Patient Present under Involuntary Commitment? No data recorded IVC Papers Initial File Date: No data recorded  [place] of Residence: No data recorded  Patient Currently Receiving the Following Services: No data recorded  Determination of Need: Emergent (2 hours)   Options For Referral: Inpatient Hospitalization; BH Urgent Care; Group Home     CCA Biopsychosocial Patient Reported Schizophrenia/Schizoaffective Diagnosis in Past: No data recorded  Strengths: uta   Mental Health Symptoms Depression:   Hopelessness; Sleep (too much or little); Fatigue; Difficulty Concentrating; Change in energy/activity; Worthlessness   Duration of Depressive symptoms:  Duration of Depressive Symptoms: Greater than two weeks   Mania:   None   Anxiety:    Worrying; Tension; Sleep; Restlessness; Fatigue; Difficulty concentrating   Psychosis:   Delusions; Hallucinations   Duration of Psychotic symptoms:  Duration of  Psychotic Symptoms: Less than six months   Trauma:   None   Obsessions:   None   Compulsions:   None   Inattention:   None   Hyperactivity/Impulsivity:   None   Oppositional/Defiant Behaviors:   None   Emotional Irregularity:   None   Other Mood/Personality Symptoms:  No data recorded   Mental Status Exam Appearance and self-care  Stature:   Average   Weight:   Average weight   Clothing:  No data recorded  Grooming:   Normal   Cosmetic use:   None   Posture/gait:   Normal   Motor activity:   Not Remarkable   Sensorium  Attention:   -- (drowsy, falling  asleep)   Concentration:   -- (drowsy, falling asleep)   Orientation:   -- [employer]inc[employer] Badder)   Recall/memory:   -- (uta)   Affect and Mood  Affect:   Flat   Mood:   Depressed   Relating  Eye contact:   Fleeting (falling asleep)   Facial expression:   Sad   Attitude toward examiner:   Cooperative   Thought and Language  Speech flow:  Soft; Slow; Garbled   Thought content:   -- [employer]inc[employer] Badder)   Preoccupation:   -- [employer] B[employer])   Hallucinations:   Auditory; Command (Comment); Visual   Organization:  No data recorded  [employer] of Knowledge:   Average   Intelligence:   Average   Abstraction:  No data recorded  Judgement:   Impaired   Reality Testing:   Distorted   Insight:   Poor   Decision Making:   Impulsive; Confused   Social Functioning  Social Maturity:   Impulsive   Social Judgement:   Heedless   Stress  Stressors:   -- (uta)   Coping Ability:   Overwhelmed; Exhausted   Skill Deficits:   Decision making; Communication; Self-control; Self-care; Responsibility   Supports:   Support needed     Religion: Religion/Spirituality Are You A Religious Person?:  Special educational needs teacher)  Leisure/Recreation: Leisure / Recreation Do You Have Hobbies?: Yes Leisure and Hobbies: walking  Exercise/Diet: Exercise/Diet Do You Exercise?: Yes What Type of Exercise Do You Do?: Run/Walk How Many Times a Week Do You Exercise?: 4-5 times a week Do You Follow a Special Diet?: No Do You Have Any Trouble Sleeping?: Yes Explanation of Sleeping Difficulties: "not enough sleep"   CCA Employment/Education Employment/Work Situation: Employment / Work Situation Employment Situation: On disability Why is Patient on Disability: uta Patient's Job has Been Impacted by Current Illness:  (uta) Has Patient ever Been in the [name]?: No  Education: Education Is Patient Currently Attending School?: No Last Grade Completed:  [employer]inc[employer] Badder) Did You Attend College?:  (uta) Did You  Have An Individualized Education Program (IIEP):  (uta)   CCA Family/Childhood History Family and Relationship History: Family history Marital status: Single Does patient have children?: No  Childhood History:  Childhood History Did patient suffer any verbal/emotional/physical/sexual abuse as a child?:  (uta) Did patient suffer from severe childhood neglect?:  (uta) Has patient ever been sexually abused/assaulted/raped as an adolescent or adult?:  (uta) Was the patient ever a victim of a crime or a disaster?:  (uta) Witnessed domestic violence?:  (uta) Has patient been affected by domestic violence as an adult?:  Special educational needs teacher)  Child/Adolescent Assessment:     CCA Substance Use Alcohol/Drug Use: Alcohol / Drug Use Pain Medications: see MAR Prescriptions: see MAR Over the Counter: see MAR History of alcohol / drug  use?: No history of alcohol / drug abuse                         ASAM's:  Six Dimensions of Multidimensional Assessment  Dimension 1:  Acute Intoxication and/or Withdrawal Potential:      Dimension 2:  Biomedical Conditions and Complications:      Dimension 3:  Emotional, Behavioral, or Cognitive Conditions and Complications:     Dimension 4:  Readiness to Change:     Dimension 5:  Relapse, Continued use, or Continued Problem Potential:     Dimension 6:  Recovery/Living Environment:     ASAM Severity Score:    ASAM Recommended Level of Treatment:     Substance use Disorder (SUD)    Recommendations for Services/Supports/Treatments: Recommendations for Services/Supports/Treatments Recommendations For Services/Supports/Treatments: Individual Therapy, Inpatient Hospitalization, Medication Management  Discharge Disposition:    DSM5 Diagnoses: There are no problems to display for this patient.    Referrals to Alternative Service(s): Referred to Alternative Service(s):   Place:   Date:   Time:    Referred to Alternative Service(s):   Place:   Date:    Time:    Referred to Alternative Service(s):   Place:   Date:   Time:    Referred to Alternative Service(s):   Place:   Date:   Time:     Venora Maples, [facility]

## 2021-07-30 NOTE — ED Notes (Addendum)
Pt came on unit behavior self controlled given meal tray of which he ate 20% and left the rest at bedside/. Pt currently sleeping without distress or signs of night tares covers head with bed sheet but respiration detected WNL

## 2021-07-30 NOTE — ED Notes (Signed)
Pt assessed by RN and physician due to discomfort in abdomen/chest area of 5/10 on pain scale. Pt purposefully swallowed long end of plastic spoon and flushed round end down toilet in bathroom out of sight. Pt stated he "does things like that from time to time due to his condition." Pt assessed lying in bed during assessment.

## 2021-07-30 NOTE — ED Notes (Signed)
Report given to Jessica [email or web address]

## 2021-07-30 NOTE — Progress Notes (Signed)
Patient has been faxed out per the request of Dr. Bronwen Betters. Patient meets [facility] inpatient criteria per Melbourne Abts, PA-C. Patient has been faxed out to the following facilities:    [facility]  [address] [name] [address] [phone]  [facility]  [address] [name] [address] [phone]  [facility]  [address], [name] [address] [phone]  [facility]  [address] [name] [address] [phone]  [facility]  [address] [name] [address] [phone]  [facility]  [address] [place], [name] [address] [phone]  [facility]  [address] [place], [name] [address] [phone]  [facility]  [address] [name] [address] [phone]  [facility]  [address] [name] [address] [phone]  [facility]  [address] [name] [address] [phone]  [facility]  [address] [name] [address] [phone]   Damita Dunnings, MSW, LCSW-A  2:03 PM 07/30/2021

## 2021-07-30 NOTE — Progress Notes (Signed)
Patient received morning medications accept for the Depakote due to waiting on lab to post results before given. Patient is appropriate, calm in demeanor. RN informed patient  the provider will come and assess and make a plan with him. Patient voiced understanding.

## 2021-07-30 NOTE — ED Notes (Signed)
P was given roast beef sub and juice for lunch

## 2021-07-30 NOTE — ED Triage Notes (Signed)
Patient from [facility], complaining of chest pain.  Patient does have some shortness of breath.  He states that it started a couple of hours ago.  He is feeling palpitations.  Patient continues with SI, patient does have a non specific plan to hurt himself.

## 2021-07-30 NOTE — Discharge Instructions (Signed)
Please avoid ingesting any more plastic spoons.  I expect that the end piece may come out in your stool   See further care at behavioral health urgent care  Return to ER if you have severe abdominal pain, trouble breathing, thoughts of harming yourself or others

## 2021-07-30 NOTE — ED Notes (Signed)
Pt was given muffin and rice krispy treat.

## 2021-07-30 NOTE — Progress Notes (Signed)
Called [facility]. Lab will use samples for Depakote level. Informed Provider.

## 2021-07-30 NOTE — ED Provider Notes (Signed)
Patient reports that he ingested "long end of a plastic spoon" from his lunch tray approximately 3 hours ago. He reports that he is having epigastric pain as well as nausea. He rates his pain 7/10; pain doesn't radiate. He denies bloody stools, abdominal swelling, vomiting, fever or chills. He says he has a history of intentional ingestion of non-nutritive objects. Patient will be sent to [facility] for medical clearance. He may return to [facility] pending medical clearance. Report called to EDP at [facility] Dr. Donato Heinz.

## 2021-07-30 NOTE — ED Notes (Signed)
Report given to [facility] with [facility]. Patient assisted in walking to safe transport vehicle without incident.

## 2021-07-30 NOTE — ED Notes (Signed)
Pt was given a snack and milk. °

## 2021-07-30 NOTE — ED Provider Notes (Addendum)
Behavioral Health Admission H&P [facility])  Date: 07/30/21 Patient Name: Louis Turner MRN: [id number] Chief Complaint:  Chief Complaint  Patient presents with   Suicidal   Homicidal   Hearing voices      Diagnoses:  Final diagnoses:  MDD (major depressive disorder), single episode, severe with psychotic features (HCC)  Suicidal ideation  Homicidal ideation    HPI: Louis Turner is a 36 year old male with reported past psychiatric history of borderline personality disorder (reported by patient's AFL provider, no past psychiatric diagnoses present in patient's chart at this time) who presents to the [facility] behavioral health urgent care [facility]) from his AFL via law enforcement voluntarily for walk-in evaluation.  Patient is currently a resident at an AFL (Address: [address] [place], [name]) and has a legal guardian Zettie Cooley [facility]): [phone]).   Patient states that he was brought to the [facility] because "I'm having thoughts of killing myself".  Patient endorses active SI on exam with intent and plan to run in front of a car.  Patient endorses history of previous suicide attempts approximately 2 months ago via trying to hang himself.  Patient denies history of self-injurious behavior via intentionally cutting or burning himself.  Patient also endorses active HI directed towards some of his roommates at the AFL.  Furthermore, patient endorses homicidal intent and plan towards his roommates at the AFL and states that his homicidal plan is to "shoot them and eat them".  Patient endorses experiencing command auditory hallucinations and visual hallucinations every other day for the past 14 days.  Patient states that his command auditory hallucinations consist of a single male voice (patient states that he does not know who the voices belong to) telling him to kill himself.  Patient reports that his visual hallucinations consist of seeing this male that tells him  to kill himself noted above, but does not provide further details regarding his AVH.  He reports that he last experienced AVH earlier this morning on 07/30/2021.  Patient denies paranoia.  Patient describes his sleep as poor, stating that he has not been sleeping enough.  Patient does not provide details regarding number of hours of sleep per night.  Patient endorses anhedonia.  He denies feelings of guilt or worthlessness, but does endorse feelings of hopelessness.  He denies energy changes, concentration changes, appetite changes, or weight changes.  Patient reports that he has only been living at his current AFL for about 7 days and states that prior to this, he had been psychiatrically hospitalized at [facility] for about 2 months.  Patient states that he was hospitalized at [facility] due to attempted suicide about 2 months ago by trying to hang himself.  Per chart review, the only previous psychiatric hospitalization that this provider is able to view is what appears to be a pediatric behavioral health admission at [facility] from 12/08/2001 to 12/12/2001, but there are no specific details documented regarding this hospitalization.  Patient reports that he is prescribed multiple psychotropic medications and states that he takes them as prescribed.  However, patient states that he does not know the names or dosages of his medications.  Patient reports that he has already received his evening dosages of his home medications.  Patient denies having an outpatient psychiatrist or therapist at this time.  Patient denies alcohol, tobacco/nicotine, or illicit substance use.  He denies access to firearms.  He reports he is currently unemployed at this time.  On exam, patient is sitting upright, fairly groomed, in no acute distress.  Eye contact is fair and fleeting.  Speech is clear and coherent with normal rate and volume.  Mood is depressed and  hopeless with mood congruent affect.  Thought processes coherent and goal directed.  Patient is alert and oriented x4, cooperative, answers all questions appropriately during the evaluation.  No indication the patient is responding to internal/external stimuli.  No delusional thought content noted.  With patient's consent, this provider obtained collateral information by speaking with patient's AFL provider Nuala Alpha Haines: [phone]).  Mr. Maurine Minister states that the patient has only been under his care for the past 7 days since he was discharged from [facility] about 1 week ago.  Mr. Maurine Minister also confirms that the patient was hospitalized at [facility] for about 2 months prior to him beginning his stay at his current AFL.  Mr. Maurine Minister states that prior to the patient being hospitalized at [facility], the patient had been living in a group home and was then living with his mother, but Mr. Maurine Minister states that he is not entirely sure why the patient ended up being hospitalized at [facility].  Mr. Maurine Minister states that last night on 07/29/2021, the patient asked him if he could speak to a therapist because he was suicidal with a plan to kill himself, which led to police being contacted and picking the patient up and taking him to the behavior health urgent care for further evaluation.  Mr. Maurine Minister states that the patient refused to disclose what her suicidal plan was at that time.  Mr. Maurine Minister also states that the patient stated yesterday that he wanted to go back to [facility].  Mr. Maurine Minister denies access to firearms or not/sharps at the AFL.  Mr. Maurine Minister denies any history of the patient making any homicidal statements or physically harming anyone in any way since the patient has been living at the AFL over the past week.  Mr. Maurine Minister states that patient's current home psychotropic medication consists of Cogentin 1 mg p.o. twice daily, Klonopin 1 mg p.o. twice daily, Depakote DR 500 mg p.o. daily/every morning,  Depakote ER 1000 mg p.o. daily at bedtime, hydroxyzine 100 mg p.o. daily at bedtime, trazodone 50 mg p.o. daily at bedtime, and melatonin 10 mg p.o. daily at bedtime.  Mr. Maurine Minister states that the patient takes all of his medications as prescribed and states that he already took his evening dosages of his medications around 8:00 PM last night on 07/29/2021.  Mr. Maurine Minister states that the patient was discharged from [facility] with prescriptions for these above psychotropic medications, and states that patient had these prescriptions refilled a few days ago (per chart review, this provider is able to confirm that these above prescriptions were filled on 07/28/2021 PDMP review also shows that patient received 14-day supply of 27 Klonopin 1 mg tablets on 07/28/2021).  Remainder of patient's additional home medications were also reviewed by this provider with Mr. Maurine Minister as well (see a full list of patient's home medications at the bottom of this note).  Mr. Maurine Minister states that the patient does not have a psychiatrist or therapist at this time.  Mr. Maurine Minister states that he is not sure that the patient is currently being prescribed the appropriate psychotropic medications.  PHQ 2-9:   Flowsheet Row ED from 07/29/2021 in [facility]  C-SSRS RISK CATEGORY High Risk        Total Time spent with patient: 30 minutes  Musculoskeletal  Strength & Muscle Tone: within normal limits Gait & Station: normal Patient leans: N/A  Psychiatric Specialty Exam  Presentation General Appearance: Appropriate for Environment; Fairly Groomed  Eye Contact:Fair; Contractor and Coherent; Normal Rate  Speech Volume:Normal  Handedness:No data recorded  Mood and Affect  Mood:Depressed; Hopeless  Affect:Congruent   Thought Process  Thought Processes:Coherent; Goal Directed  Descriptions of Associations:Intact  Orientation:Full (Time, Place and Person)  Thought  Content:Logical; WDL    Hallucinations:Hallucinations: Auditory; Visual; Command Description of Command Hallucinations: See HPI for details. Description of Auditory Hallucinations: See HPI for details. Description of Visual Hallucinations: See HPI for details.  Ideas of Reference:None  Suicidal Thoughts:Suicidal Thoughts: Yes, Active SI Active Intent and/or Plan: With Intent; With Plan; With Means to Carry Out; With Access to Means  Homicidal Thoughts:Homicidal Thoughts: Yes, Active HI Active Intent and/or Plan: With Intent; With Plan; With Means to Carry Out; Without Access to Means   Sensorium  Memory:Immediate Fair; Recent Fair; Remote Fair  Judgment:Poor  Insight:Poor; Lacking   Executive Functions  Concentration:Fair  Attention Span:Fair  Recall:Fair  Fund of Knowledge:Fair  Language:Fair   Psychomotor Activity  Psychomotor Activity:Psychomotor Activity: Normal   Assets  Assets:Communication Skills; Desire for Improvement; Housing; Leisure Time; Physical Health; Social Support; Resilience   Sleep  Sleep:Sleep: Poor   Nutritional Assessment (For OBS and FBC admissions only) Has the patient had a weight loss or gain of 10 pounds or more in the last 3 months?: No Has the patient had a decrease in food intake/or appetite?: No Does the patient have dental problems?: No Does the patient have eating habits or behaviors that may be indicators of an eating disorder including binging or inducing vomiting?: No Has the patient recently lost weight without trying?: 0 Has the patient been eating poorly because of a decreased appetite?: 0 Malnutrition Screening Tool Score: 0    Physical Exam Vitals reviewed.  Constitutional:      General: He is not in acute distress.    Appearance: He is not ill-appearing, toxic-appearing or diaphoretic.  HENT:     Head: Normocephalic and atraumatic.     Right Ear: External ear normal.     Left Ear: External ear normal.      Nose: Nose normal.  Eyes:     General:        Right eye: No discharge.        Left eye: No discharge.     Conjunctiva/sclera: Conjunctivae normal.     Comments: Tattoos noted on patient's bilateral eyelids.  Cardiovascular:     Rate and Rhythm: Tachycardia present.  Pulmonary:     Effort: Pulmonary effort is normal. No respiratory distress.  Musculoskeletal:        General: Normal range of motion.     Cervical back: Normal range of motion.  Neurological:     General: No focal deficit present.     Mental Status: He is alert and oriented to person, place, and time.     Comments: No tremor noted.   Psychiatric:        Attention and Perception: He perceives auditory and visual hallucinations.        Speech: Speech normal.        Behavior: Behavior normal. Behavior is not agitated, slowed, aggressive, withdrawn, hyperactive or combative. Behavior is cooperative.        Thought Content: Thought content is not paranoid or delusional. Thought content includes homicidal and suicidal ideation. Thought content includes homicidal  and suicidal plan.     Comments: Mood is depressed and hopeless with mood congruent affect.     Review of Systems  Constitutional:  Negative for chills, diaphoresis, fever, malaise/fatigue and weight loss.  HENT:  Negative for congestion.   Respiratory:  Negative for cough and shortness of breath.   Cardiovascular:  Negative for chest pain and palpitations.  Gastrointestinal:  Negative for abdominal pain, constipation, diarrhea, nausea and vomiting.  Musculoskeletal:  Negative for joint pain and myalgias.  Neurological:  Negative for dizziness, seizures and headaches.  Psychiatric/Behavioral:  Positive for depression, hallucinations and suicidal ideas. Negative for memory loss and substance abuse. The patient has insomnia.   All other systems reviewed and are negative.  Vitals: Blood pressure (!) 134/93, pulse (!) 113, temperature 98 F (36.7 C), temperature source  Oral, resp. rate 20, SpO2 97 %. There is no height or weight on file to calculate BMI.  Past Psychiatric History: reported past psychiatric history of borderline personality disorder (reported by patient's AFL provider, no past psychiatric diagnoses present in patient's chart at this time).  See HPI for further details regarding patient's past psychiatric history.  Is the patient at risk to self? Yes  Has the patient been a risk to self in the past 6 months? Yes .    Has the patient been a risk to self within the distant past? Yes   Is the patient a risk to others? Yes   Has the patient been a risk to others in the past 6 months? No   Has the patient been a risk to others within the distant past? No   Past Medical History: History reviewed. No pertinent past medical history. History reviewed. No pertinent surgical history.  Family History: History reviewed. No pertinent family history.  Social History:  Social History   Socioeconomic History   Marital status: Single    Spouse name: Not on file   Number of children: Not on file   Years of education: Not on file   Highest education level: Not on file  Occupational History   Not on file  Tobacco Use   Smoking status: Unknown   Smokeless tobacco: Not on file  Substance and Sexual Activity   Alcohol use: Not on file   Drug use: Not on file   Sexual activity: Not on file  Other Topics Concern   Not on file  Social History Narrative   Not on file   Social Determinants of Health   Financial Resource Strain: Not on file  Food Insecurity: Not on file  Transportation Needs: Not on file  Physical Activity: Not on file  Stress: Not on file  Social Connections: Not on file  Intimate Partner Violence: Not on file    SDOH:  SDOH Screenings   Alcohol Screen: Not on file  Depression (PHQ2-9): Not on file  Financial Resource Strain: Not on file  Food Insecurity: Not on file  Housing: Not on file  Physical Activity: Not on file   Social Connections: Not on file  Stress: Not on file  Tobacco Use: Not on file  Transportation Needs: Not on file    Last Labs:  Admission on 07/29/2021  Component Date Value Ref Range Status   SARS Coronavirus 2 by RT PCR 07/30/2021 NEGATIVE  NEGATIVE Final   Comment: (NOTE) SARS-CoV-2 target nucleic acids are NOT DETECTED.  The SARS-CoV-2 RNA is generally detectable in upper respiratory specimens during the acute phase of infection. The lowest concentration of SARS-CoV-2  viral copies this assay can detect is 138 copies/mL. A negative result does not preclude SARS-Cov-2 infection and should not be used as the sole basis for treatment or other patient management decisions. A negative result may occur with  improper specimen collection/handling, submission of specimen other than nasopharyngeal swab, presence of viral mutation(s) within the areas targeted by this assay, and inadequate number of viral copies(<138 copies/mL). A negative result must be combined with clinical observations, patient history, and epidemiological information. The expected result is Negative.  Fact Sheet for Patients:  BloggerCourse.com  Fact Sheet for Healthcare Providers:  SeriousBroker.it  This test is no                          t yet approved or cleared by the [place] FDA and  has been authorized for detection and/or diagnosis of SARS-CoV-2 by FDA under an Emergency Use Authorization (EUA). This EUA will remain  in effect (meaning this test can be used) for the duration of the COVID-19 declaration under Section 564(b)(1) of the Act, 21 U.S.C.section 360bbb-3(b)(1), unless the authorization is terminated  or revoked sooner.       Influenza A by PCR 07/30/2021 NEGATIVE  NEGATIVE Final   Influenza B by PCR 07/30/2021 NEGATIVE  NEGATIVE Final   Comment: (NOTE) The Xpert Xpress SARS-CoV-2/FLU/RSV plus assay is intended as an aid in the  diagnosis of influenza from Nasopharyngeal swab specimens and should not be used as a sole basis for treatment. Nasal washings and aspirates are unacceptable for Xpert Xpress SARS-CoV-2/FLU/RSV testing.  Fact Sheet for Patients: BloggerCourse.com  Fact Sheet for Healthcare Providers: SeriousBroker.it  This test is not yet approved or cleared by the [place] FDA and has been authorized for detection and/or diagnosis of SARS-CoV-2 by FDA under an Emergency Use Authorization (EUA). This EUA will remain in effect (meaning this test can be used) for the duration of the COVID-19 declaration under Section 564(b)(1) of the Act, 21 U.S.C. section 360bbb-3(b)(1), unless the authorization is terminated or revoked.  Performed at [facility], [address] [place], [name] [address]    SARS Coronavirus 2 Ag 07/30/2021 Negative  Negative Preliminary   WBC 07/30/2021 5.9  4.0 - 10.5 K/uL Final   RBC 07/30/2021 4.79  4.22 - 5.81 MIL/uL Final   Hemoglobin 07/30/2021 13.7  13.0 - 17.0 g/dL Final   HCT 61/95/0932 41.8  39.0 - 52.0 % Final   MCV 07/30/2021 87.3  80.0 - 100.0 fL Final   MCH 07/30/2021 28.6  26.0 - 34.0 pg Final   MCHC 07/30/2021 32.8  30.0 - 36.0 g/dL Final   RDW 67/05/4579 13.2  11.5 - 15.5 % Final   Platelets 07/30/2021 228  150 - 400 K/uL Final   nRBC 07/30/2021 0.0  0.0 - 0.2 % Final   Neutrophils Relative % 07/30/2021 43  % Final   Neutro Abs 07/30/2021 2.6  1.7 - 7.7 K/uL Final   Lymphocytes Relative 07/30/2021 50  % Final   Lymphs Abs 07/30/2021 2.9  0.7 - 4.0 K/uL Final   Monocytes Relative 07/30/2021 6  % Final   Monocytes Absolute 07/30/2021 0.4  0.1 - 1.0 K/uL Final   Eosinophils Relative 07/30/2021 1  % Final   Eosinophils Absolute 07/30/2021 0.0  0.0 - 0.5 K/uL Final   Basophils Relative 07/30/2021 0  % Final   Basophils Absolute 07/30/2021 0.0  0.0 - 0.1 K/uL Final   Immature Granulocytes 07/30/2021  0   % Final   Abs Immature Granulocytes 07/30/2021 0.02  0.00 - 0.07 K/uL Final   Performed at [facility], [address] [place], [place] [address]   Sodium 07/30/2021 142  135 - 145 mmol/L Final   Potassium 07/30/2021 3.3 (L)  3.5 - 5.1 mmol/L Final   Chloride 07/30/2021 107  98 - 111 mmol/L Final   CO2 07/30/2021 26  22 - 32 mmol/L Final   Glucose, Bld 07/30/2021 132 (H)  70 - 99 mg/dL Final   Glucose reference range applies only to samples taken after fasting for at least 8 hours.   BUN 07/30/2021 13  6 - 20 mg/dL Final   Creatinine, Ser 07/30/2021 0.93  0.61 - 1.24 mg/dL Final   Calcium 13/08/657802/17/2023 9.3  8.9 - 10.3 mg/dL Final   Total Protein 46/96/295202/17/2023 6.4 (L)  6.5 - 8.1 g/dL Final   Albumin 84/13/244002/17/2023 3.6  3.5 - 5.0 g/dL Final   AST 10/27/253602/17/2023 23  15 - 41 U/L Final   ALT 07/30/2021 28  0 - 44 U/L Final   Alkaline Phosphatase 07/30/2021 76  38 - 126 U/L Final   Total Bilirubin 07/30/2021 0.2 (L)  0.3 - 1.2 mg/dL Final   GFR, Estimated 07/30/2021 >60  >60 mL/min Final   Comment: (NOTE) Calculated using the CKD-EPI Creatinine Equation (2021)    Anion gap 07/30/2021 9  5 - 15 Final   Performed at [facility], [address] [place], [place] [address]   Hgb A1c MFr Bld 07/30/2021 5.4  4.8 - 5.6 % Final   Comment: (NOTE) Pre diabetes:          5.7%-6.4%  Diabetes:              >6.4%  Glycemic control for   <7.0% adults with diabetes    Mean Plasma Glucose 07/30/2021 108.28  mg/dL Final   Performed at [facility], [address] [place], [place] [address]   Alcohol, Ethyl (B) 07/30/2021 <10  <10 mg/dL Final   Comment: (NOTE) Lowest detectable limit for serum alcohol is 10 mg/dL.  For medical purposes only. Performed at [facility], [address] [place], [place] [address]    Cholesterol 07/30/2021 144  0 - 200 mg/dL Final   Triglycerides 75/64/332902/17/2023 365 (H)  <150 mg/dL Final   HDL 51/88/416602/17/2023 33 (L)  >40 mg/dL Final   Total CHOL/HDL Ratio  07/30/2021 4.4  RATIO Final   VLDL 07/30/2021 73 (H)  0 - 40 mg/dL Final   LDL Cholesterol 07/30/2021 38  0 - 99 mg/dL Final   Comment:        Total Cholesterol/HDL:CHD Risk Coronary Heart Disease Risk Table                     Men   Women  1/2 Average Risk   3.4   3.3  Average Risk       5.0   4.4  2 X Average Risk   9.6   7.1  3 X Average Risk  23.4   11.0        Use the calculated Patient Ratio above and the CHD Risk Table to determine the patient's CHD Risk.        ATP III CLASSIFICATION (LDL):  <100     mg/dL   Optimal  [id number]  mg/dL   Near or Above  Optimal  130-159  mg/dL   Borderline  [id number]  mg/dL   High  >086>190     mg/dL   Very High Performed at [facility], [address] [place], [place] [address]    TSH 07/30/2021 2.401  0.350 - 4.500 uIU/mL Final   Comment: Performed by a 3rd Generation assay with a functional sensitivity of <=0.01 uIU/mL. Performed at [facility], [address] [place], [place] [address]    POC Amphetamine UR 07/30/2021 None Detected  NONE DETECTED (Cut Off Level 1000 ng/mL) Final   POC Secobarbital (BAR) 07/30/2021 None Detected  NONE DETECTED (Cut Off Level 300 ng/mL) Final   POC Buprenorphine (BUP) 07/30/2021 None Detected  NONE DETECTED (Cut Off Level 10 ng/mL) Final   POC Oxazepam (BZO) 07/30/2021 None Detected  NONE DETECTED (Cut Off Level 300 ng/mL) Final   POC Cocaine UR 07/30/2021 None Detected  NONE DETECTED (Cut Off Level 300 ng/mL) Final   POC Methamphetamine UR 07/30/2021 None Detected  NONE DETECTED (Cut Off Level 1000 ng/mL) Final   POC Morphine 07/30/2021 None Detected  NONE DETECTED (Cut Off Level 300 ng/mL) Final   POC Oxycodone UR 07/30/2021 None Detected  NONE DETECTED (Cut Off Level 100 ng/mL) Final   POC Methadone UR 07/30/2021 None Detected  NONE DETECTED (Cut Off Level 300 ng/mL) Final   POC Marijuana UR 07/30/2021 None Detected  NONE DETECTED (Cut Off Level 50 ng/mL) Final    SARSCOV2ONAVIRUS 2 AG 07/30/2021 NEGATIVE  NEGATIVE Final   Comment: (NOTE) SARS-CoV-2 antigen NOT DETECTED.   Negative results are presumptive.  Negative results do not preclude SARS-CoV-2 infection and should not be used as the sole basis for treatment or other patient management decisions, including infection  control decisions, particularly in the presence of clinical signs and  symptoms consistent with COVID-19, or in those who have been in contact with the virus.  Negative results must be combined with clinical observations, patient history, and epidemiological information. The expected result is Negative.  Fact Sheet for Patients: https://www.jennings-kim.com/https://www.fda.gov/media/141569/download  Fact Sheet for Healthcare Providers: https://alexander-rogers.biz/https://www.fda.gov/media/141568/download  This test is not yet approved or cleared by the [place] FDA and  has been authorized for detection and/or diagnosis of SARS-CoV-2 by FDA under an Emergency Use Authorization (EUA).  This EUA will remain in effect (meaning this test can be used) for the duration of  the COV                          ID-19 declaration under Section 564(b)(1) of the Act, 21 U.S.C. section 360bbb-3(b)(1), unless the authorization is terminated or revoked sooner.      Allergies: Latex  PTA Medications: (Not in a hospital admission)   Medical Decision Making  Patient is a 36 year old male with past psychiatric and medical history as stated above who [facility] [employer] from AFL for SI with plan, HI with plan, and AVH (see HPI for details).  Based on patient's current presentation and collateral information obtained from patient's AFL provider, the patient's psychiatric condition appears to be severely decompensated to the point that the patient appears to be a danger to himself and others at this time.  Thus, patient meets inpatient psychiatric treatment criteria at this time.    Recommendations   Based on my evaluation the patient does not appear to have an emergency medical condition.  Recommend inpatient psychiatric treatment for the patient.  Patient is agreeable to inpatient psychiatric treatment.  Patient will be admitted to [facility] continuous assessment for further crisis stabilization and treatment while waiting for placement for inpatient psychiatric treatment.  Patient is agreeable to this plan.  Social work to assist with placement for inpatient psychiatric treatment.  With patient's consent, patient's [facility] provider (Kristopher Maurine Minister: [phone]) was updated on patient's above treatment plan/disposition.  Mr. Lyndel Pleasure understanding of this plan/disposition and states that he will notify patient's legal guardian of the treatment plan/disposition Zettie Cooley [facility]): [phone]).   Labs/tests ordered and reviewed:  -PCR Flu A&B, COVID: Negative  -UDS: Negative  -CBC with differential: Within normal limits  -CMP: Mild hypokalemia noted with serum potassium slightly reduced at 3.3 mmol/L.  Serum glucose elevated at 132 mg/dL.  Total protein slightly reduced at 6.4 g/dL-essentially normal.  Total bilirubin slightly reduced at 0.2 mg/dL-essentially normal.  Based on patient's presentation, these lab values do not appear to be indicative of an emergent medical condition at this time.  CMP otherwise unremarkable.  -Ethanol: Less than 10 mg/dL/within normal limits  -TSH, lipid panel, and hemoglobin A1c ordered to have on file for potential future initiation of antipsychotic medication.  Hemoglobin A1c within normal limits at 5.4%.  Lipid panel shows triglycerides elevated at 365 mg/dL, HDL reduced at 33 mg/dL, and VLDL elevated at 73 mg/dL.  Patient may follow up with outpatient PCP regarding these lipid panel values upon future discharge.  Lipid panel otherwise unremarkable.  -TSH: Within normal limits at 2.401 uIU/mL  -EKG ordered to check patient's QT/QTC to have on  file for potential future initiation of antipsychotic medication.  Patient's EKG shows normal sinus rhythm with automatic computer read of "Nonspecific T wave abnormality".  Patient denies any chest pain, shortness of breath, or any additional physical symptoms at this time.  Patient's EKG reviewed by this provider with Dr. Eudelia Bunch [facility] ED), who confirmed that patient's EKG shows no acute/concerning findings or signs of ischemia.  EKG also shows QT/QTC of 376/428 ms.  -Due to patient reportedly taking his bedtime dose of Depakote around 8:00 PM on 07/29/2021, valproic acid level ordered to be drawn later this morning at 0800 on 07/30/2021 prior to patient receiving his morning Depakote dosage.  Patient's Depakote dosing may need to be adjusted accordingly depending on patient's a.m. Depakote level results.  Will continue the following home medications at this time:  -Lipitor 20 mg p.o. daily at bedtime for hypercholesterolemia  -Cogentin 1 mg p.o. twice daily for potential EPS symptoms caused by medications  -Klonopin 1 mg p.o. twice daily for anxiety  -Depakote ER 500 mg p.o. daily/every morning for mood stability  -Depakote ER 1000 mg p.o. daily at bedtime for mood stability  -Hydroxyzine 100 mg p.o. daily at bedtime for anxiety  -Trazodone 50 mg p.o. daily at bedtime for insomnia  -Vitamin D3 2000 units p.o. daily for vitamin D deficiency  -Protonix 80 mg p.o. daily for GERD (formulary alternative the patient's home medication of omeprazole 40 mg p.o. daily)  Additional as needed medications ordered:  -Tylenol 650 mg p.o. every 6 hours as needed for mild pain  -Maalox/Mylanta 30 mL p.o. every 4 hours as needed for indigestion  -Milk of Magnesia 30 mL p.o. daily as needed for mild constipation  Jaclyn Shaggy, PA-C 07/30/21  5:39 AM

## 2021-07-31 ENCOUNTER — Ambulatory Visit (HOSPITAL_COMMUNITY)
Admission: EM | Admit: 2021-07-31 | Discharge: 2021-07-31 | Disposition: A | Discharge status: Other Acute Inpatient Hospital | Payer: No Typology Code available for payment source | Attending: Urology | Admitting: Urology

## 2021-07-31 DIAGNOSIS — F333 Major depressive disorder, recurrent, severe with psychotic symptoms: Secondary | ICD-10-CM | POA: Insufficient documentation

## 2021-07-31 DIAGNOSIS — R45851 Suicidal ideations: Secondary | ICD-10-CM | POA: Insufficient documentation

## 2021-07-31 DIAGNOSIS — F603 Borderline personality disorder: Secondary | ICD-10-CM | POA: Insufficient documentation

## 2021-07-31 MED ORDER — PANTOPRAZOLE SODIUM 40 MG PO TBEC
80.0000 mg | DELAYED_RELEASE_TABLET | Freq: Every day | ORAL | Status: DC
  Administered 2021-07-31: 80 mg via ORAL
  Filled 2021-07-31: qty 2

## 2021-07-31 MED ORDER — ATORVASTATIN CALCIUM 10 MG PO TABS
20.0000 mg | ORAL_TABLET | Freq: Every day | ORAL | Status: DC
  Administered 2021-07-31: 20 mg via ORAL
  Filled 2021-07-31: qty 2

## 2021-07-31 MED ORDER — LORAZEPAM 1 MG PO TABS
1.0000 mg | ORAL_TABLET | Freq: Four times a day (QID) | ORAL | Status: DC | PRN
  Administered 2021-07-31 (×2): 1 mg via ORAL
  Filled 2021-07-31 (×2): qty 1

## 2021-07-31 MED ORDER — HYDROXYZINE HCL 25 MG PO TABS
100.0000 mg | ORAL_TABLET | Freq: Every day | ORAL | Status: DC
  Administered 2021-07-31: 100 mg via ORAL
  Filled 2021-07-31: qty 4

## 2021-07-31 MED ORDER — CLONAZEPAM 1 MG PO TABS
1.0000 mg | ORAL_TABLET | Freq: Two times a day (BID) | ORAL | Status: DC
  Administered 2021-07-31 (×2): 1 mg via ORAL
  Filled 2021-07-31 (×2): qty 1

## 2021-07-31 MED ORDER — DIVALPROEX SODIUM ER 500 MG PO TB24
1000.0000 mg | ORAL_TABLET | Freq: Every day | ORAL | Status: DC
  Administered 2021-07-31: 1000 mg via ORAL
  Filled 2021-07-31: qty 2

## 2021-07-31 MED ORDER — DIPHENHYDRAMINE HCL 50 MG/ML IJ SOLN
25.0000 mg | Freq: Once | INTRAMUSCULAR | Status: AC
  Administered 2021-07-31: 25 mg via INTRAVENOUS
  Filled 2021-07-31: qty 1

## 2021-07-31 MED ORDER — LORAZEPAM 1 MG PO TABS: 2.0000 mg | ORAL_TABLET | Freq: Once | ORAL | Status: AC | PRN

## 2021-07-31 MED ORDER — LOPERAMIDE HCL 2 MG PO CAPS
2.0000 mg | ORAL_CAPSULE | ORAL | Status: DC | PRN
  Administered 2021-07-31: 2 mg via ORAL
  Filled 2021-07-31: qty 1

## 2021-07-31 MED ORDER — BENZTROPINE MESYLATE 1 MG PO TABS
1.0000 mg | ORAL_TABLET | Freq: Two times a day (BID) | ORAL | Status: DC
  Administered 2021-07-31 (×2): 1 mg via ORAL
  Filled 2021-07-31 (×2): qty 1

## 2021-07-31 MED ORDER — TRAZODONE HCL 50 MG PO TABS
50.0000 mg | ORAL_TABLET | Freq: Every day | ORAL | Status: DC
  Administered 2021-07-31: 50 mg via ORAL
  Filled 2021-07-31: qty 1

## 2021-07-31 MED ORDER — DIVALPROEX SODIUM ER 500 MG PO TB24
500.0000 mg | ORAL_TABLET | Freq: Every day | ORAL | Status: DC
  Filled 2021-07-31: qty 1

## 2021-07-31 MED ORDER — LORAZEPAM 2 MG/ML IJ SOLN
2.0000 mg | Freq: Once | INTRAMUSCULAR | Status: AC | PRN
  Administered 2021-07-31: 2 mg via INTRAMUSCULAR
  Filled 2021-07-31: qty 1

## 2021-07-31 MED ORDER — ZIPRASIDONE MESYLATE 20 MG IM SOLR
20.0000 mg | Freq: Two times a day (BID) | INTRAMUSCULAR | Status: DC | PRN
  Administered 2021-07-31: 20 mg via INTRAMUSCULAR
  Filled 2021-07-31: qty 20

## 2021-07-31 MED ORDER — QUETIAPINE FUMARATE 300 MG PO TABS
300.0000 mg | ORAL_TABLET | Freq: Every day | ORAL | Status: DC
  Administered 2021-07-31: 300 mg via ORAL
  Filled 2021-07-31: qty 1

## 2021-07-31 MED ORDER — LORAZEPAM 2 MG/ML IJ SOLN: 1.0000 mg | Freq: Four times a day (QID) | INTRAMUSCULAR | Status: DC | PRN

## 2021-07-31 NOTE — Progress Notes (Signed)
CSW followed-up with Suzanne from [facility] in reference to a referral sent for this patient. It was reported that this patient is pending review.  ° °Rafia Shedden, MSW, LCSW-A, LCAS-A °Phone: [phone] °Disposition/TOC °

## 2021-07-31 NOTE — ED Notes (Signed)
Pt assessed in [facility] continuous assessment after arriving from [facility] ED via safe transport due to purposeful ingestion of partial plastic spoon. Pt extremely agitated and making threats to staff. Multiple RN's and security in immediate area attempting to deescalate. Pt upset guardian would not consent to him leaving AMA, thus requiring stay at [facility]. Pt stated he would hurt himself if kept here and bit hand to cause self harm during agitation. Pt taken to assessment room 136 to continue deescalation and administer medications. Pt stated eh wanted to "murder all the people from [facility]." Deescalation successful and pt agreed to take medications. Pt L hand observed and no injury noted along with pt stating he was not in any pain. Pt taken back to continuous assessment, given snack as requested, and resting. Will continue to monitor for safety.

## 2021-07-31 NOTE — ED Notes (Signed)
Pt waiting to be d/c to [place] due to rectal bleeding and pain. Pt calm at this time and cooperative. Will continue to monitor for safey

## 2021-07-31 NOTE — ED Notes (Signed)
Pt in adult side of continuous assessment sleeping with no acute signs of distress noted. Respirations even and unlabored. Will continue to monitor for safety.

## 2021-07-31 NOTE — ED Provider Notes (Signed)
Behavioral Health Admission H&P [facility] & OBS)  Date: 07/31/21 Patient Name: Louis Turner MRN: [id number] Chief Complaint: No chief complaint on file.     Diagnoses:  Final diagnoses:  None    HPI: Louis Turner is a 36 year old male with psychiatric history of Depression and borderline personality disorder. [facility] received from [facility] post medical clearance for intentional ingestion of a plastic spoon.  Patient was seen face to face upon his arrival back to [facility].  On evaluation, patient is alert and oriented x4.  Patient is dressed in scrubs, he is able to maintain fair eye contact. He is speaking in a clear voice. Patient's mood is irritable with congruent affect, his thought process is linear.   Patient is agitated and restless, patient is requesting to be discharged. Patient states that if he remains at [facility] he will engage in self harming behavior such as "biting fingers off." Informed patient that permission from legal guardian is needed before patient may leave AMA. Patient become more agitated when informed that legal guardian is unwilling to sign him out AMA. Multiple attempts to deescalate patient were unsuccessful. Medication order for agitation. Patient denies suicidal, homicidal ideation, and hallucination.       PHQ 2-9:   [place] ED from 07/31/2021 in [facility] ED from 07/30/2021 in [place] ED from 07/29/2021 in [place] CATEGORY High Risk High Risk High Risk        Total Time spent with patient: 20 minutes  Musculoskeletal  Strength & Muscle Tone: within normal limits Gait & Station: normal Patient leans: Right  Psychiatric Specialty Exam  Presentation General Appearance: Casual  Eye Contact:Good  Speech:Clear and Coherent  Speech Volume:Normal  Handedness:Right   Mood and Affect  Mood:Irritable;  Angry  Affect:Congruent   Thought Process  Thought Processes:Coherent  Descriptions of Associations:Intact  Orientation:Full (Time, Place and Person)  Thought Content:WDL    Hallucinations:Hallucinations: None Description of Command Hallucinations: See H&P Description of Auditory Hallucinations: See H&P Description of Visual Hallucinations: See H&P  Ideas of Reference:None  Suicidal Thoughts:Suicidal Thoughts: No SI Active Intent and/or Plan: Without Intent; With Plan; Without Means to Carry Out  Homicidal Thoughts:Homicidal Thoughts: No HI Active Intent and/or Plan: Without Intent; With Plan; Without Means to [place]; Remote Fair; Recent Fair  Judgment:Poor  Insight:Poor   Executive Functions  Concentration:Poor  Attention Span:Poor  [place]   Psychomotor Activity  Psychomotor Activity:Psychomotor Activity: Normal   Assets  Assets:Communication Skills; Housing   Sleep  Sleep:Sleep: Fair   Nutritional Assessment (For OBS and FBC admissions only) Has the patient had a weight loss or gain of 10 pounds or more in the last 3 months?: No Has the patient had a decrease in food intake/or appetite?: No Does the patient have dental problems?: No Does the patient have eating habits or behaviors that may be indicators of an eating disorder including binging or inducing vomiting?: No Has the patient recently lost weight without trying?: 0 Has the patient been eating poorly because of a decreased appetite?: 0 Malnutrition Screening Tool Score: 0    Physical Exam Vitals and nursing note reviewed.  Constitutional:      General: He is not in acute distress.    Appearance: He is well-developed.  HENT:     Head: Normocephalic and atraumatic.  Eyes:     General:  Right eye: No discharge.        Left eye: No discharge.     Conjunctiva/sclera: Conjunctivae normal.   Cardiovascular:     Rate and Rhythm: Normal rate and regular rhythm.     Heart sounds: No murmur heard.    Comments: Refused  Pulmonary:     Effort: Pulmonary effort is normal. No respiratory distress.     Breath sounds: Normal breath sounds.  Abdominal:     Palpations: Abdomen is soft.     Tenderness: There is no abdominal tenderness.     Comments: Refused   Musculoskeletal:        General: No swelling. Normal range of motion.     Cervical back: Normal range of motion and neck supple.  Skin:    General: Skin is warm and dry.     Capillary Refill: Capillary refill takes less than 2 seconds.  Neurological:     Mental Status: He is alert and oriented to person, place, and time.  Psychiatric:        Mood and Affect: Affect is angry.        Speech: Speech normal.        Behavior: Behavior is agitated and aggressive.        Thought Content: Thought content normal.        Cognition and Memory: Cognition normal.   Review of Systems  Constitutional: Negative.   HENT: Negative.    Eyes: Negative.   Respiratory: Negative.    Cardiovascular: Negative.   Gastrointestinal: Negative.   Genitourinary: Negative.   Musculoskeletal: Negative.   Skin: Negative.   Neurological: Negative.   Endo/Heme/Allergies: Negative.   Psychiatric/Behavioral:  Positive for depression. The patient is nervous/anxious.    There were no vitals taken for this visit. There is no height or weight on file to calculate BMI.  Past Psychiatric History:    Is the patient at risk to self? Yes  Has the patient been a risk to self in the past 6 months? Yes .    Has the patient been a risk to self within the distant past? No   Is the patient a risk to others? No   Has the patient been a risk to others in the past 6 months? No   Has the patient been a risk to others within the distant past? No   Past Medical History: No past medical history on file. No past surgical history on file.  Family History: No family  history on file.  Social History:  Social History   Socioeconomic History   Marital status: Single    Spouse name: Not on file   Number of children: Not on file   Years of education: Not on file   Highest education level: Not on file  Occupational History   Not on file  Tobacco Use   Smoking status: Unknown   Smokeless tobacco: Not on file  Substance and Sexual Activity   Alcohol use: Not on file   Drug use: Not on file   Sexual activity: Not on file  Other Topics Concern   Not on file  Social History Narrative   Not on file   Social Determinants of Health   Financial Resource Strain: Not on file  Food Insecurity: Not on file  Transportation Needs: Not on file  Physical Activity: Not on file  Stress: Not on file  Social Connections: Not on file  Intimate Partner Violence: Not on file    SDOH:  SDOH Screenings   Alcohol Screen: Not on file  Depression (PHQ2-9): Not on file  Financial Resource Strain: Not on file  Food Insecurity: Not on file  Housing: Not on file  Physical Activity: Not on file  Social Connections: Not on file  Stress: Not on file  Tobacco Use: Not on file  Transportation Needs: Not on file    Last Labs:  Admission on 07/30/2021, Discharged on 07/30/2021  Component Date Value Ref Range Status   Sodium 07/30/2021 139  135 - 145 mmol/L Final   Potassium 07/30/2021 4.2  3.5 - 5.1 mmol/L Final   DELTA CHECK NOTED   Chloride 07/30/2021 106  98 - 111 mmol/L Final   CO2 07/30/2021 23  22 - 32 mmol/L Final   Glucose, Bld 07/30/2021 118 (H)  70 - 99 mg/dL Final   Glucose reference range applies only to samples taken after fasting for at least 8 hours.   BUN 07/30/2021 11  6 - 20 mg/dL Final   Creatinine, Ser 07/30/2021 0.89  0.61 - 1.24 mg/dL Final   Calcium 07/30/2021 9.5  8.9 - 10.3 mg/dL Final   GFR, Estimated 07/30/2021 >60  >60 mL/min Final   Comment: (NOTE) Calculated using the CKD-EPI Creatinine Equation (2021)    Anion gap 07/30/2021  10  5 - 15 Final   Performed at [facility], [place] [address] [place], [place] [address]   WBC 07/30/2021 6.1  4.0 - 10.5 K/uL Final   RBC 07/30/2021 5.21  4.22 - 5.81 MIL/uL Final   Hemoglobin 07/30/2021 14.9  13.0 - 17.0 g/dL Final   HCT 07/30/2021 45.2  39.0 - 52.0 % Final   MCV 07/30/2021 86.8  80.0 - 100.0 fL Final   MCH 07/30/2021 28.6  26.0 - 34.0 pg Final   MCHC 07/30/2021 33.0  30.0 - 36.0 g/dL Final   RDW 07/30/2021 13.2  11.5 - 15.5 % Final   Platelets 07/30/2021 269  150 - 400 K/uL Final   nRBC 07/30/2021 0.0  0.0 - 0.2 % Final   Performed at [facility], [place] [address] [place], [place] [address]   Troponin I (High Sensitivity) 07/30/2021 3  <18 ng/L Final   Comment: (NOTE) Elevated high sensitivity troponin I (hsTnI) values and significant  changes across serial measurements may suggest ACS but many other  chronic and acute conditions are known to elevate hsTnI results.  Refer to the Links section for chest pain algorithms and additional  guidance. Performed at [facility], [place] [address], [place], [place] [address]    Alcohol, Ethyl (B) 07/30/2021 <10  <10 mg/dL Final   Comment: (NOTE) Lowest detectable limit for serum alcohol is 10 mg/dL.  For medical purposes only. Performed at [facility], [place] [address] [place],  [address]    SARS Coronavirus 2 by RT PCR 07/30/2021 NEGATIVE  NEGATIVE Final   Comment: (NOTE) SARS-CoV-2 target nucleic acids are NOT DETECTED.  The SARS-CoV-2 RNA is generally detectable in upper respiratory specimens during the acute phase of infection. The lowest concentration of SARS-CoV-2 viral copies this assay can detect is 138 copies/mL. A negative result does not preclude SARS-Cov-2 infection and should not be used as the sole basis for treatment or other patient management decisions. A negative result may occur with  improper specimen collection/handling, submission of specimen other than  nasopharyngeal swab, presence of viral mutation(s) within the areas targeted by this assay, and inadequate number of viral copies(<138  copies/mL). A negative result must be combined with clinical observations, patient history, and epidemiological information. The expected result is Negative.  Fact Sheet for Patients:  EntrepreneurPulse.com.au  Fact Sheet for Healthcare Providers:  IncredibleEmployment.be  This test is no                          t yet approved or cleared by the [place] FDA and  has been authorized for detection and/or diagnosis of SARS-CoV-2 by FDA under an Emergency Use Authorization (EUA). This EUA will remain  in effect (meaning this test can be used) for the duration of the COVID-19 declaration under Section 564(b)(1) of the Act, 21 U.S.C.section 360bbb-3(b)(1), unless the authorization is terminated  or revoked sooner.       Influenza A by PCR 07/30/2021 NEGATIVE  NEGATIVE Final   Influenza B by PCR 07/30/2021 NEGATIVE  NEGATIVE Final   Comment: (NOTE) The Xpert Xpress SARS-CoV-2/FLU/RSV plus assay is intended as an aid in the diagnosis of influenza from Nasopharyngeal swab specimens and should not be used as a sole basis for treatment. Nasal washings and aspirates are unacceptable for Xpert Xpress SARS-CoV-2/FLU/RSV testing.  Fact Sheet for Patients: EntrepreneurPulse.com.au  Fact Sheet for Healthcare Providers: IncredibleEmployment.be  This test is not yet approved or cleared by the [place] FDA and has been authorized for detection and/or diagnosis of SARS-CoV-2 by FDA under an Emergency Use Authorization (EUA). This EUA will remain in effect (meaning this test can be used) for the duration of the COVID-19 declaration under Section 564(b)(1) of the Act, 21 U.S.C. section 360bbb-3(b)(1), unless the authorization is terminated or revoked.  Performed at [facility], [place] [address], [place], [place] [address]   Admission on 07/29/2021, Discharged on 07/30/2021  Component Date Value Ref Range Status   SARS Coronavirus 2 by RT PCR 07/30/2021 NEGATIVE  NEGATIVE Final   Comment: (NOTE) SARS-CoV-2 target nucleic acids are NOT DETECTED.  The SARS-CoV-2 RNA is generally detectable in upper respiratory specimens during the acute phase of infection. The lowest concentration of SARS-CoV-2 viral copies this assay can detect is 138 copies/mL. A negative result does not preclude SARS-Cov-2 infection and should not be used as the sole basis for treatment or other patient management decisions. A negative result may occur with  improper specimen collection/handling, submission of specimen other than nasopharyngeal swab, presence of viral mutation(s) within the areas targeted by this assay, and inadequate number of viral copies(<138 copies/mL). A negative result must be combined with clinical observations, patient history, and epidemiological information. The expected result is Negative.  Fact Sheet for Patients:  EntrepreneurPulse.com.au  Fact Sheet for Healthcare Providers:  IncredibleEmployment.be  This test is no                          t yet approved or cleared by the [place] FDA and  has been authorized for detection and/or diagnosis of SARS-CoV-2 by FDA under an Emergency Use Authorization (EUA). This EUA will remain  in effect (meaning this test can be used) for the duration of the COVID-19 declaration under Section 564(b)(1) of the Act, 21 U.S.C.section 360bbb-3(b)(1), unless the authorization is terminated  or revoked sooner.       Influenza A by PCR 07/30/2021 NEGATIVE  NEGATIVE Final   Influenza B by PCR 07/30/2021 NEGATIVE  NEGATIVE Final   Comment: (NOTE) The Xpert Xpress SARS-CoV-2/FLU/RSV plus assay is intended as an  aid in the diagnosis of influenza from Nasopharyngeal swab specimens  and should not be used as a sole basis for treatment. Nasal washings and aspirates are unacceptable for Xpert Xpress SARS-CoV-2/FLU/RSV testing.  Fact Sheet for Patients: https://www.fda.gov/media/152166/download  Fact Sheet for Healthcare Providers: SeriousBroker.ithttps://www.fda.gov/media/152162/download  This test is not yet approved or cleared by the [place] FDA and has been autBloggerCourse.comhorized for detection and/or diagnosis of SARS-CoV-2 by FDA under an Emergency Use Authorization (EUA). This EUA will remain in effect (meaning this test can be used) for the duration of the COVID-19 declaration under Section 564(b)(1) of the Act, 21 U.S.C. section 360bbb-3(b)(1), unless the authorization is terminated or revoked.  Performed at [facility], [address] [place], [place] [address]    SARS Coronavirus 2 Ag 07/30/2021 Negative  Negative Preliminary   WBC 07/30/2021 5.9  4.0 - 10.5 K/uL Final   RBC 07/30/2021 4.79  4.22 - 5.81 MIL/uL Final   Hemoglobin 07/30/2021 13.7  13.0 - 17.0 g/dL Final   HCT 60/45/409802/17/2023 41.8  39.0 - 52.0 % Final   MCV 07/30/2021 87.3  80.0 - 100.0 fL Final   MCH 07/30/2021 28.6  26.0 - 34.0 pg Final   MCHC 07/30/2021 32.8  30.0 - 36.0 g/dL Final   RDW 11/91/478202/17/2023 13.2  11.5 - 15.5 % Final   Platelets 07/30/2021 228  150 - 400 K/uL Final   nRBC 07/30/2021 0.0  0.0 - 0.2 % Final   Neutrophils Relative % 07/30/2021 43  % Final   Neutro Abs 07/30/2021 2.6  1.7 - 7.7 K/uL Final   Lymphocytes Relative 07/30/2021 50  % Final   Lymphs Abs 07/30/2021 2.9  0.7 - 4.0 K/uL Final   Monocytes Relative 07/30/2021 6  % Final   Monocytes Absolute 07/30/2021 0.4  0.1 - 1.0 K/uL Final   Eosinophils Relative 07/30/2021 1  % Final   Eosinophils Absolute 07/30/2021 0.0  0.0 - 0.5 K/uL Final   Basophils Relative 07/30/2021 0  % Final   Basophils Absolute 07/30/2021 0.0  0.0 - 0.1 K/uL Final   Immature Granulocytes 07/30/2021 0  % Final   Abs Immature Granulocytes 07/30/2021 0.02   0.00 - 0.07 K/uL Final   Performed at [facility], [address] [place], [place] [address]   Sodium 07/30/2021 142  135 - 145 mmol/L Final   Potassium 07/30/2021 3.3 (L)  3.5 - 5.1 mmol/L Final   Chloride 07/30/2021 107  98 - 111 mmol/L Final   CO2 07/30/2021 26  22 - 32 mmol/L Final   Glucose, Bld 07/30/2021 132 (H)  70 - 99 mg/dL Final   Glucose reference range applies only to samples taken after fasting for at least 8 hours.   BUN 07/30/2021 13  6 - 20 mg/dL Final   Creatinine, Ser 07/30/2021 0.93  0.61 - 1.24 mg/dL Final   Calcium 30/86/578402/17/2023 9.3  8.9 - 10.3 mg/dL Final   Total Protein 69/62/952802/17/2023 6.4 (L)  6.5 - 8.1 g/dL Final   Albumin 41/32/440102/17/2023 3.6  3.5 - 5.0 g/dL Final   AST 02/72/536602/17/2023 23  15 - 41 U/L Final   ALT 07/30/2021 28  0 - 44 U/L Final   Alkaline Phosphatase 07/30/2021 76  38 - 126 U/L Final   Total Bilirubin 07/30/2021 0.2 (L)  0.3 - 1.2 mg/dL Final   GFR, Estimated 07/30/2021 >60  >60 mL/min Final   Comment: (NOTE) Calculated using the CKD-EPI Creatinine Equation (2021)    Anion gap  07/30/2021 9  5 - 15 Final   Performed at [facility], [place] [address] [place], [place] [address]   Hgb A1c MFr Bld 07/30/2021 5.4  4.8 - 5.6 % Final   Comment: (NOTE) Pre diabetes:          5.7%-6.4%  Diabetes:              >6.4%  Glycemic control for   <7.0% adults with diabetes    Mean Plasma Glucose 07/30/2021 108.28  mg/dL Final   Performed at [facility], [place] [address] [place], [place] [address]   Alcohol, Ethyl (B) 07/30/2021 <10  <10 mg/dL Final   Comment: (NOTE) Lowest detectable limit for serum alcohol is 10 mg/dL.  For medical purposes only. Performed at [facility], [place] [address], [place], [place] [address]    Cholesterol 07/30/2021 144  0 - 200 mg/dL Final   Triglycerides 07/30/2021 365 (H)  <150 mg/dL Final   HDL 07/30/2021 33 (L)  >40 mg/dL Final   Total CHOL/HDL Ratio 07/30/2021 4.4  RATIO Final   VLDL 07/30/2021 73 (H)  0 -  40 mg/dL Final   LDL Cholesterol 07/30/2021 38  0 - 99 mg/dL Final   Comment:        Total Cholesterol/HDL:CHD Risk Coronary Heart Disease Risk Table                     Men   Women  1/2 Average Risk   3.4   3.3  Average Risk       5.0   4.4  2 X Average Risk   9.6   7.1  3 X Average Risk  23.4   11.0        Use the calculated Patient Ratio above and the CHD Risk Table to determine the patient's CHD Risk.        ATP III CLASSIFICATION (LDL):  <100     mg/dL   Optimal  100-129  mg/dL   Near or Above                    Optimal  130-159  mg/dL   Borderline  160-189  mg/dL   High  >190     mg/dL   Very High Performed at [place] [address], [place], [place] [address]    TSH 07/30/2021 2.401  0.350 - 4.500 uIU/mL Final   Comment: Performed by a 3rd Generation assay with a functional sensitivity of <=0.01 uIU/mL. Performed at [facility], [place] [address] [place], [place] [address]    POC Amphetamine UR 07/30/2021 None Detected  NONE DETECTED (Cut Off Level 1000 ng/mL) Final   POC Secobarbital (BAR) 07/30/2021 None Detected  NONE DETECTED (Cut Off Level 300 ng/mL) Final   POC Buprenorphine (BUP) 07/30/2021 None Detected  NONE DETECTED (Cut Off Level 10 ng/mL) Final   POC Oxazepam (BZO) 07/30/2021 None Detected  NONE DETECTED (Cut Off Level 300 ng/mL) Final   POC Cocaine UR 07/30/2021 None Detected  NONE DETECTED (Cut Off Level 300 ng/mL) Final   POC Methamphetamine UR 07/30/2021 None Detected  NONE DETECTED (Cut Off Level 1000 ng/mL) Final   POC Morphine 07/30/2021 None Detected  NONE DETECTED (Cut Off Level 300 ng/mL) Final   POC Oxycodone UR 07/30/2021 None Detected  NONE DETECTED (Cut Off Level 100 ng/mL) Final   POC Methadone UR 07/30/2021 None Detected  NONE DETECTED (Cut  Off Level 300 ng/mL) Final   POC Marijuana UR 07/30/2021 None Detected  NONE DETECTED (Cut Off Level 50 ng/mL) Final   SARSCOV2ONAVIRUS 2 AG 07/30/2021 NEGATIVE  NEGATIVE Final   Comment:  (NOTE) SARS-CoV-2 antigen NOT DETECTED.   Negative results are presumptive.  Negative results do not preclude SARS-CoV-2 infection and should not be used as the sole basis for treatment or other patient management decisions, including infection  control decisions, particularly in the presence of clinical signs and  symptoms consistent with COVID-19, or in those who have been in contact with the virus.  Negative results must be combined with clinical observations, patient history, and epidemiological information. The expected result is Negative.  Fact Sheet for Patients: HandmadeRecipes.com.cy  Fact Sheet for Healthcare Providers: FuneralLife.at  This test is not yet approved or cleared by the [place] FDA and  has been authorized for detection and/or diagnosis of SARS-CoV-2 by FDA under an Emergency Use Authorization (EUA).  This EUA will remain in effect (meaning this test can be used) for the duration of  the COV                          ID-19 declaration under Section 564(b)(1) of the Act, 21 U.S.C. section 360bbb-3(b)(1), unless the authorization is terminated or revoked sooner.     Valproic Acid Lvl 07/30/2021 72  50.0 - 100.0 ug/mL Final   Performed at [facility], [place] [address] [place], [place] [address]    Allergies: Latex  PTA Medications: (Not in a hospital admission)   Medical Decision Making  Patient continues to meet inpatient criteria; he is readmitted to [facility] for continuous assessment while awaiting inpatient psychiatric placement.     Recommendations  Based on my evaluation the patient does not appear to have an emergency medical condition.  Ophelia Shoulder, NP 07/31/21  1:53 AM

## 2021-07-31 NOTE — ED Notes (Signed)
Pt refused his scheduled Depakote ER 500 mg. MD made aware.

## 2021-07-31 NOTE — ED Notes (Signed)
Pt pending IVC.

## 2021-07-31 NOTE — ED Notes (Signed)
Patient refused to take 6pm vitals.

## 2021-07-31 NOTE — Progress Notes (Signed)
Inpatient Behavioral Health Placement  Pt meets inpatient criteria per Corky Sox, MD and Serafina Mitchell, MD. There are no appropriate beds at [facility] per [place], RN. Referral was sent to the following facilities;   Destination Service Provider Address Phone Fax  [address] [place] [address] [phone]  [facility]  [place], [place] [address] [place]  [facility]  [address] [place] [address] [place]  [facility]  [address], [place] [address] [phone]  [facility]  [place], [place] [address] [phone] [facility]  [address] [place], [place] [address] [phone] [facility]  [address] [place] [address] [phone] [facility]  [address] [place] [address] [phone]  [facility]  [address], [place] [address] [phone] [place], [place] [employer] [address] [id number] [facility]  [address] [place] [address] [phone] [place]  [address] [place] [address] [phone] [facility]  [place], [place] [address] [phone]  [facility]  [address] [place], [place] [address] [place]  [address] [place] [address] [phone]  [facility]  [address], [place] [address] [phone]    Situation ongoing,   CSW will follow up.   Benjaman Kindler, MSW, LCSWA 07/31/2021  @ 12:11 AM

## 2021-07-31 NOTE — ED Notes (Signed)
Pt c/o of diarrhea and requests medication. Imodium 2 mg, PRN given.

## 2021-07-31 NOTE — ED Notes (Signed)
Pt found with metal washer in his hand, he willingly turned it over.

## 2021-07-31 NOTE — ED Notes (Signed)
Pt asleep with even and unlabored respirations. No distress or discomfort noted. Pt remains safe on the unit. Will continue to monitor. 

## 2021-07-31 NOTE — ED Notes (Signed)
[facility] Transport requested to [facility], pt is IVC.

## 2021-07-31 NOTE — ED Notes (Signed)
Pt requesting his meds. Staff explained that Pt does not have any prescribed meds. Provider informed per the other nurse. Safety maintained and will continue to monitor.

## 2021-07-31 NOTE — ED Provider Notes (Signed)
Behavioral Health Progress Note  Date and Time: 07/31/2021 12:58 PM Name: Louis Turner MRN:  [id number]  Subjective:   Louis Turner is a 36 year old male with reported past psychiatric history of borderline personality disorder (reported by patient's ALF provider, no past psychiatric diagnoses present in patient's chart at this time) who presents to the [facility] behavioral health urgent care [facility]) from his ALF via law enforcement voluntarily for walk-in evaluation.  Patient is currently a resident at an AFL (Address: [address] [place], [name]) and has a legal guardian Zettie Cooley [facility]): [phone]). He presented for command auditory hallucinations and suicidal thoughts.  Earlier this morning, the patient reportedly swallowed a part of a spoon.  He was transported to the emergency department for medical evaluation.  After negative abdominal x-ray and chest x-ray, he was medically cleared and transported back to the behavioral health urgent care.  On interview and assessment this morning, the patient has a bizarre appearance with a depressed affect.  He reports some mild abdominal pain, which she states that he is experienced for many days (previous to the ingestion of the part of a spoon).  Review of systems is otherwise negative as below. He reports suicidal thoughts with no particular plan.  He continues to report command auditory hallucinations telling him to kill himself and others.  He denies any intention of harming other people.  He reports that his appetite and sleep have been adequate.  Legal guardian Zettie Cooley [facility]): [phone]) unable to be reached. Called crisis line at [phone] and spoke with Rueben Bash, who reports the patient was recently discharged from [facility].  He states that he feels the patient could potentially be trying to sabotage the new living arrangement that he has.  He reports that he does not have the  patient's medication list and encourages [place] to contact the owner of the group home where he is living. Group ALF called and updated on plan for inpatient placement, reports that the patient's meds are as previously listed in [facility] Rosanne Ashing Moeringer called back and he states consent is given to start an antipsychotic medication for the patient and for this medication to be increased upwards as we see fit.  He is updated on placement attempts.   Called and spoke with ALF leader again. Yevonne Pax: [phone]).  He reports that the patient is welcome back to the ALF as soon as possible even with his command auditory hallucinations and current psychiatric state (which is poor).  Discussed with him the recent ingestion of a spoon.  After hearing about this, the ALF leader is amenable to at least another 24 hours of admission to determine if the patient is stable for discharge.    Diagnosis:  Final diagnoses:  None    Total Time spent with patient: 20 minutes  Past Psychiatric History: reported past psychiatric history of borderline personality disorder (reported by patient's ALF provider, no past psychiatric diagnoses present in patient's chart at this time).  See HPI for further details regarding patient's past psychiatric history. Past Medical History: No past medical history on file. No past surgical history on file. Family History: No family history on file. Family Psychiatric  History: denies Hx of major mental illness Social History:  Social History   Substance and Sexual Activity  Alcohol Use None     Social History   Substance and Sexual Activity  Drug Use Not on file    Social History   Socioeconomic  History   Marital status: Single    Spouse name: Not on file   Number of children: Not on file   Years of education: Not on file   Highest education level: Not on file  Occupational History   Not on file  Tobacco Use   Smoking status: Unknown   Smokeless  tobacco: Not on file  Substance and Sexual Activity   Alcohol use: Not on file   Drug use: Not on file   Sexual activity: Not on file  Other Topics Concern   Not on file  Social History Narrative   Not on file   Social Determinants of Health   Financial Resource Strain: Not on file  Food Insecurity: Not on file  Transportation Needs: Not on file  Physical Activity: Not on file  Stress: Not on file  Social Connections: Not on file   SDOH:  SDOH Screenings   Alcohol Screen: Not on file  Depression (PHQ2-9): Not on file  Financial Resource Strain: Not on file  Food Insecurity: Not on file  Housing: Not on file  Physical Activity: Not on file  Social Connections: Not on file  Stress: Not on file  Tobacco Use: Not on file  Transportation Needs: Not on file   Additional Social History:           Sleep: Fair  Appetite:  Fair  Current Medications:  Current Facility-Administered Medications  Medication Dose Route Frequency Provider Last Rate Last Admin   atorvastatin (LIPITOR) tablet 20 mg  20 mg Oral QHS Carlyn Reichert, MD       benztropine (COGENTIN) tablet 1 mg  1 mg Oral BID Carlyn Reichert, MD   1 mg at 07/31/21 1147   clonazePAM (KLONOPIN) tablet 1 mg  1 mg Oral BID Carlyn Reichert, MD   1 mg at 07/31/21 1146   divalproex (DEPAKOTE ER) 24 hr tablet 1,000 mg  1,000 mg Oral QHS Carlyn Reichert, MD       divalproex (DEPAKOTE ER) 24 hr tablet 500 mg  500 mg Oral Daily Carlyn Reichert, MD       hydrOXYzine (ATARAX) tablet 100 mg  100 mg Oral QHS Carlyn Reichert, MD       LORazepam (ATIVAN) tablet 1 mg  1 mg Oral Q6H PRN Carlyn Reichert, MD       Or   LORazepam (ATIVAN) injection 1 mg  1 mg Intramuscular Q6H PRN Carlyn Reichert, MD       pantoprazole (PROTONIX) EC tablet 80 mg  80 mg Oral Daily Carlyn Reichert, MD   80 mg at 07/31/21 1147   traZODone (DESYREL) tablet 50 mg  50 mg Oral QHS Carlyn Reichert, MD       ziprasidone (GEODON) injection 20 mg  20 mg  Intramuscular Q12H PRN Ajibola, Ene A, NP   20 mg at 07/31/21 0100   Current Outpatient Medications  Medication Sig Dispense Refill   atorvastatin (LIPITOR) 20 MG tablet Take 20 mg by mouth at bedtime.     benztropine (COGENTIN) 1 MG tablet Take 1 mg by mouth 2 (two) times daily.     clonazePAM (KLONOPIN) 1 MG tablet Take 1 mg by mouth 2 (two) times daily.     divalproex (DEPAKOTE ER) 500 MG 24 hr tablet Take 500 mg by mouth daily.     hydrOXYzine (ATARAX) 50 MG tablet Take 100 mg by mouth at bedtime.     omeprazole (PRILOSEC) 40 MG capsule Take 40 mg by mouth every morning.  QUEtiapine (SEROQUEL) 300 MG tablet Take 1 tablet by mouth at bedtime.     traZODone (DESYREL) 50 MG tablet Take 50 mg by mouth at bedtime.      Labs  Lab Results:  Admission on 07/30/2021, Discharged on 07/30/2021  Component Date Value Ref Range Status   Sodium 07/30/2021 139  135 - 145 mmol/L Final   Potassium 07/30/2021 4.2  3.5 - 5.1 mmol/L Final   DELTA CHECK NOTED   Chloride 07/30/2021 106  98 - 111 mmol/L Final   CO2 07/30/2021 23  22 - 32 mmol/L Final   Glucose, Bld 07/30/2021 118 (H)  70 - 99 mg/dL Final   Glucose reference range applies only to samples taken after fasting for at least 8 hours.   BUN 07/30/2021 11  6 - 20 mg/dL Final   Creatinine, Ser 07/30/2021 0.89  0.61 - 1.24 mg/dL Final   Calcium 16/03/9603 9.5  8.9 - 10.3 mg/dL Final   GFR, Estimated 07/30/2021 >60  >60 mL/min Final   Comment: (NOTE) Calculated using the CKD-EPI Creatinine Equation (2021)    Anion gap 07/30/2021 10  5 - 15 Final   Performed at [facility], [address], [place], [name] [address]   WBC 07/30/2021 6.1  4.0 - 10.5 K/uL Final   RBC 07/30/2021 5.21  4.22 - 5.81 MIL/uL Final   Hemoglobin 07/30/2021 14.9  13.0 - 17.0 g/dL Final   HCT 11/91/4782 45.2  39.0 - 52.0 % Final   MCV 07/30/2021 86.8  80.0 - 100.0 fL Final   MCH 07/30/2021 28.6  26.0 - 34.0 pg Final   MCHC 07/30/2021 33.0  30.0 - 36.0 g/dL  Final   RDW 95/62/1308 13.2  11.5 - 15.5 % Final   Platelets 07/30/2021 269  150 - 400 K/uL Final   nRBC 07/30/2021 0.0  0.0 - 0.2 % Final   Performed at [facility], [address] [place], [name] [address]   Troponin I (High Sensitivity) 07/30/2021 3  <18 ng/L Final   Comment: (NOTE) Elevated high sensitivity troponin I (hsTnI) values and significant  changes across serial measurements may suggest ACS but many other  chronic and acute conditions are known to elevate hsTnI results.  Refer to the Links section for chest pain algorithms and additional  guidance. Performed at [facility], [address] [place], [name] [address]    Alcohol, Ethyl (B) 07/30/2021 <10  <10 mg/dL Final   Comment: (NOTE) Lowest detectable limit for serum alcohol is 10 mg/dL.  For medical purposes only. Performed at [facility], [address], [place], [name] [address]    SARS Coronavirus 2 by RT PCR 07/30/2021 NEGATIVE  NEGATIVE Final   Comment: (NOTE) SARS-CoV-2 target nucleic acids are NOT DETECTED.  The SARS-CoV-2 RNA is generally detectable in upper respiratory specimens during the acute phase of infection. The lowest concentration of SARS-CoV-2 viral copies this assay can detect is 138 copies/mL. A negative result does not preclude SARS-Cov-2 infection and should not be used as the sole basis for treatment or other patient management decisions. A negative result may occur with  improper specimen collection/handling, submission of specimen other than nasopharyngeal swab, presence of viral mutation(s) within the areas targeted by this assay, and inadequate number of viral copies(<138 copies/mL). A negative result must be combined with clinical observations, patient history, and epidemiological information. The expected result is Negative.  Fact Sheet for Patients:  BloggerCourse.com  Fact Sheet for  Healthcare Providers:   SeriousBroker.it  This test is no                          t yet approved or cleared by the [place] and  has been authorized for detection and/or diagnosis of SARS-CoV-2 by FDA under an Emergency Use Authorization (EUA). This EUA will remain  in effect (meaning this test can be used) for the duration of the COVID-19 declaration under Section 564(b)(1) of the Act, 21 U.S.C.section 360bbb-3(b)(1), unless the authorization is terminated  or revoked sooner.       Influenza A by PCR 07/30/2021 NEGATIVE  NEGATIVE Final   Influenza B by PCR 07/30/2021 NEGATIVE  NEGATIVE Final   Comment: (NOTE) The Xpert Xpress SARS-CoV-2/FLU/RSV plus assay is intended as an aid in the diagnosis of influenza from Nasopharyngeal swab specimens and should not be used as a sole basis for treatment. Nasal washings and aspirates are unacceptable for Xpert Xpress SARS-CoV-2/FLU/RSV testing.  Fact Sheet for Patients: BloggerCourse.com  Fact Sheet for Healthcare Providers: SeriousBroker.it  This test is not yet approved or cleared by the [place] FDA and has been authorized for detection and/or diagnosis of SARS-CoV-2 by FDA under an Emergency Use Authorization (EUA). This EUA will remain in effect (meaning this test can be used) for the duration of the COVID-19 declaration under Section 564(b)(1) of the Act, 21 U.S.C. section 360bbb-3(b)(1), unless the authorization is terminated or revoked.  Performed at [facility], [address], [place], [name] [address]   Admission on 07/29/2021, Discharged on 07/30/2021  Component Date Value Ref Range Status   SARS Coronavirus 2 by RT PCR 07/30/2021 NEGATIVE  NEGATIVE Final   Comment: (NOTE) SARS-CoV-2 target nucleic acids are NOT DETECTED.  The SARS-CoV-2 RNA is generally detectable in upper respiratory specimens during the acute phase of infection. The  lowest concentration of SARS-CoV-2 viral copies this assay can detect is 138 copies/mL. A negative result does not preclude SARS-Cov-2 infection and should not be used as the sole basis for treatment or other patient management decisions. A negative result may occur with  improper specimen collection/handling, submission of specimen other than nasopharyngeal swab, presence of viral mutation(s) within the areas targeted by this assay, and inadequate number of viral copies(<138 copies/mL). A negative result must be combined with clinical observations, patient history, and epidemiological information. The expected result is Negative.  Fact Sheet for Patients:  BloggerCourse.com  Fact Sheet for Healthcare Providers:  SeriousBroker.it  This test is no                          t yet approved or cleared by the [place] FDA and  has been authorized for detection and/or diagnosis of SARS-CoV-2 by FDA under an Emergency Use Authorization (EUA). This EUA will remain  in effect (meaning this test can be used) for the duration of the COVID-19 declaration under Section 564(b)(1) of the Act, 21 U.S.C.section 360bbb-3(b)(1), unless the authorization is terminated  or revoked sooner.       Influenza A by PCR 07/30/2021 NEGATIVE  NEGATIVE Final   Influenza B by PCR 07/30/2021 NEGATIVE  NEGATIVE Final   Comment: (NOTE) The Xpert Xpress SARS-CoV-2/FLU/RSV plus assay is intended as an aid in the diagnosis of influenza from Nasopharyngeal swab specimens and should not be used as a sole basis for treatment. Nasal washings and aspirates are unacceptable for Xpert Xpress  SARS-CoV-2/FLU/RSV testing.  Fact Sheet for Patients: BloggerCourse.comhttps://www.fda.gov/media/152166/download  Fact Sheet for Healthcare Providers: SeriousBroker.ithttps://www.fda.gov/media/152162/download  This test is not yet approved or cleared by the [place] FDA and has been authorized for  detection and/or diagnosis of SARS-CoV-2 by FDA under an Emergency Use Authorization (EUA). This EUA will remain in effect (meaning this test can be used) for the duration of the COVID-19 declaration under Section 564(b)(1) of the Act, 21 U.S.C. section 360bbb-3(b)(1), unless the authorization is terminated or revoked.  Performed at [facility], [address] [place], [place] [address]    SARS Coronavirus 2 Ag 07/30/2021 Negative  Negative Preliminary   WBC 07/30/2021 5.9  4.0 - 10.5 K/uL Final   RBC 07/30/2021 4.79  4.22 - 5.81 MIL/uL Final   Hemoglobin 07/30/2021 13.7  13.0 - 17.0 g/dL Final   HCT 60/45/409802/17/2023 41.8  39.0 - 52.0 % Final   MCV 07/30/2021 87.3  80.0 - 100.0 fL Final   MCH 07/30/2021 28.6  26.0 - 34.0 pg Final   MCHC 07/30/2021 32.8  30.0 - 36.0 g/dL Final   RDW 11/91/478202/17/2023 13.2  11.5 - 15.5 % Final   Platelets 07/30/2021 228  150 - 400 K/uL Final   nRBC 07/30/2021 0.0  0.0 - 0.2 % Final   Neutrophils Relative % 07/30/2021 43  % Final   Neutro Abs 07/30/2021 2.6  1.7 - 7.7 K/uL Final   Lymphocytes Relative 07/30/2021 50  % Final   Lymphs Abs 07/30/2021 2.9  0.7 - 4.0 K/uL Final   Monocytes Relative 07/30/2021 6  % Final   Monocytes Absolute 07/30/2021 0.4  0.1 - 1.0 K/uL Final   Eosinophils Relative 07/30/2021 1  % Final   Eosinophils Absolute 07/30/2021 0.0  0.0 - 0.5 K/uL Final   Basophils Relative 07/30/2021 0  % Final   Basophils Absolute 07/30/2021 0.0  0.0 - 0.1 K/uL Final   Immature Granulocytes 07/30/2021 0  % Final   Abs Immature Granulocytes 07/30/2021 0.02  0.00 - 0.07 K/uL Final   Performed at [facility], [address] [place], [place] [address]   Sodium 07/30/2021 142  135 - 145 mmol/L Final   Potassium 07/30/2021 3.3 (L)  3.5 - 5.1 mmol/L Final   Chloride 07/30/2021 107  98 - 111 mmol/L Final   CO2 07/30/2021 26  22 - 32 mmol/L Final   Glucose, Bld 07/30/2021 132 (H)  70 - 99 mg/dL Final   Glucose reference range applies only to  samples taken after fasting for at least 8 hours.   BUN 07/30/2021 13  6 - 20 mg/dL Final   Creatinine, Ser 07/30/2021 0.93  0.61 - 1.24 mg/dL Final   Calcium 30/86/578402/17/2023 9.3  8.9 - 10.3 mg/dL Final   Total Protein 69/62/952802/17/2023 6.4 (L)  6.5 - 8.1 g/dL Final   Albumin 41/32/440102/17/2023 3.6  3.5 - 5.0 g/dL Final   AST 02/72/536602/17/2023 23  15 - 41 U/L Final   ALT 07/30/2021 28  0 - 44 U/L Final   Alkaline Phosphatase 07/30/2021 76  38 - 126 U/L Final   Total Bilirubin 07/30/2021 0.2 (L)  0.3 - 1.2 mg/dL Final   GFR, Estimated 07/30/2021 >60  >60 mL/min Final   Comment: (NOTE) Calculated using the CKD-EPI Creatinine Equation (2021)    Anion gap 07/30/2021 9  5 - 15 Final   Performed at [facility], [address] [place], [place] [address]   Hgb A1c MFr Bld 07/30/2021 5.4  4.8 - 5.6 % Final   Comment: (NOTE) Pre diabetes:          5.7%-6.4%  Diabetes:              >6.4%  Glycemic control for   <7.0% adults with diabetes    Mean Plasma Glucose 07/30/2021 108.28  mg/dL Final   Performed at [facility], [address], [place], [name] [address]   Alcohol, Ethyl (B) 07/30/2021 <10  <10 mg/dL Final   Comment: (NOTE) Lowest detectable limit for serum alcohol is 10 mg/dL.  For medical purposes only. Performed at [facility], [address], [place], [name] [address]    Cholesterol 07/30/2021 144  0 - 200 mg/dL Final   Triglycerides 09/81/1914 365 (H)  <150 mg/dL Final   HDL 78/29/5621 33 (L)  >40 mg/dL Final   Total CHOL/HDL Ratio 07/30/2021 4.4  RATIO Final   VLDL 07/30/2021 73 (H)  0 - 40 mg/dL Final   LDL Cholesterol 07/30/2021 38  0 - 99 mg/dL Final   Comment:        Total Cholesterol/HDL:CHD Risk Coronary Heart Disease Risk Table                     Men   Women  1/2 Average Risk   3.4   3.3  Average Risk       5.0   4.4  2 X Average Risk   9.6   7.1  3 X Average Risk  23.4   11.0        Use the calculated Patient Ratio above and the CHD Risk Table to determine  the patient's CHD Risk.        ATP III CLASSIFICATION (LDL):  <100     mg/dL   Optimal  308-657  mg/dL   Near or Above                    Optimal  130-159  mg/dL   Borderline  846-962  mg/dL   High  >952     mg/dL   Very High Performed at [facility], [address] [place], [name] [address]    TSH 07/30/2021 2.401  0.350 - 4.500 uIU/mL Final   Comment: Performed by a 3rd Generation assay with a functional sensitivity of <=0.01 uIU/mL. Performed at [facility], [address] [place], [name] [address]    POC Amphetamine UR 07/30/2021 None Detected  NONE DETECTED (Cut Off Level 1000 ng/mL) Final   POC Secobarbital (BAR) 07/30/2021 None Detected  NONE DETECTED (Cut Off Level 300 ng/mL) Final   POC Buprenorphine (BUP) 07/30/2021 None Detected  NONE DETECTED (Cut Off Level 10 ng/mL) Final   POC Oxazepam (BZO) 07/30/2021 None Detected  NONE DETECTED (Cut Off Level 300 ng/mL) Final   POC Cocaine UR 07/30/2021 None Detected  NONE DETECTED (Cut Off Level 300 ng/mL) Final   POC Methamphetamine UR 07/30/2021 None Detected  NONE DETECTED (Cut Off Level 1000 ng/mL) Final   POC Morphine 07/30/2021 None Detected  NONE DETECTED (Cut Off Level 300 ng/mL) Final   POC Oxycodone UR 07/30/2021 None Detected  NONE DETECTED (Cut Off Level 100 ng/mL) Final   POC Methadone UR 07/30/2021 None Detected  NONE DETECTED (Cut Off Level 300 ng/mL) Final   POC Marijuana UR 07/30/2021 None Detected  NONE DETECTED (Cut Off Level 50 ng/mL) Final   SARSCOV2ONAVIRUS 2 AG 07/30/2021 NEGATIVE  NEGATIVE  Final   Comment: (NOTE) SARS-CoV-2 antigen NOT DETECTED.   Negative results are presumptive.  Negative results do not preclude SARS-CoV-2 infection and should not be used as the sole basis for treatment or other patient management decisions, including infection  control decisions, particularly in the presence of clinical signs and  symptoms consistent with COVID-19, or in those who have been in contact  with the virus.  Negative results must be combined with clinical observations, patient history, and epidemiological information. The expected result is Negative.  Fact Sheet for Patients: https://www.jennings-kim.com/  Fact Sheet for Healthcare Providers: https://alexander-rogers.biz/  This test is not yet approved or cleared by the [place] FDA and  has been authorized for detection and/or diagnosis of SARS-CoV-2 by FDA under an Emergency Use Authorization (EUA).  This EUA will remain in effect (meaning this test can be used) for the duration of  the COV                          ID-19 declaration under Section 564(b)(1) of the Act, 21 U.S.C. section 360bbb-3(b)(1), unless the authorization is terminated or revoked sooner.     Valproic Acid Lvl 07/30/2021 72  50.0 - 100.0 ug/mL Final   Performed at [facility], [address] [place], [name] [address]    Blood Alcohol level:  Lab Results  Component Value Date   ETH <10 07/30/2021   ETH <10 07/30/2021    Metabolic Disorder Labs: Lab Results  Component Value Date   HGBA1C 5.4 07/30/2021   MPG 108.28 07/30/2021   No results found for: PROLACTIN Lab Results  Component Value Date   CHOL 144 07/30/2021   TRIG 365 (H) 07/30/2021   HDL 33 (L) 07/30/2021   CHOLHDL 4.4 07/30/2021   VLDL 73 (H) 07/30/2021   LDLCALC 38 07/30/2021    Therapeutic Lab Levels: No results found for: LITHIUM Lab Results  Component Value Date   VALPROATE 72 07/30/2021   No components found for:  CBMZ  Physical Findings   Flowsheet Row ED from 07/31/2021 in [facility] ED from 07/30/2021 in [facility] ED from 07/29/2021 in [facility]  C-SSRS RISK CATEGORY High Risk High Risk High Risk        Musculoskeletal  Strength & Muscle Tone: within normal limits Gait & Station: normal Patient leans:  N/A  Psychiatric Specialty Exam  Presentation  General Appearance: Bizarre  Eye Contact:Good  Speech:Clear and Coherent  Speech Volume:Normal  Handedness:Right  Mood and Affect  Mood:Anxious; Angry  Affect:Congruent   Thought Process  Thought Processes:Coherent  Descriptions of Associations:Intact  Orientation:Full (Time, Place and Person)  Thought Content:Logical   Duration of Psychotic Symptoms: Greater than six months   Hallucinations: as described in subjective above Ideas of Reference:None  Suicidal Thoughts: yes, without plan or intention Homicidal Thoughts: no  Sensorium  Memory:Immediate Fair; Recent Fair; Remote Fair  Judgment:Poor  Insight:Poor   Executive Functions  Concentration:Poor  Attention Span:Poor  Recall:Poor  Fund of Knowledge:Poor  Language:Poor   Psychomotor Activity  Psychomotor Activity:Psychomotor Activity: Normal   Assets  Assets:Housing; Communication Skills   Sleep  Sleep:Sleep: Fair   Nutritional Assessment (For OBS and FBC admissions only) Has the patient had a weight loss or gain of 10 pounds or more in the last 3 months?: No Has the patient had a decrease in food intake/or appetite?: No Does the patient have dental  problems?: No Does the patient have eating habits or behaviors that may be indicators of an eating disorder including binging or inducing vomiting?: No Has the patient recently lost weight without trying?: 0 Has the patient been eating poorly because of a decreased appetite?: 0 Malnutrition Screening Tool Score: 0    Physical Exam  Physical Exam Vitals reviewed.  Constitutional:      Appearance: He is not toxic-appearing.  Pulmonary:     Effort: Pulmonary effort is normal.  Neurological:     General: No focal deficit present.     Mental Status: He is alert and oriented to person, place, and time.   Review of Systems  Respiratory:  Negative for shortness of breath.   Cardiovascular:   Negative for chest pain.  Gastrointestinal:  Negative for constipation, diarrhea, nausea and vomiting.  Neurological:  Negative for headaches.  Psychiatric/Behavioral:  Positive for depression and suicidal ideas.   Positive for mild ABD pain Blood pressure (!) 118/91, pulse 80, temperature 97.7 F (36.5 C), temperature source Oral, resp. rate 18, SpO2 98 %. There is no height or weight on file to calculate BMI.  Treatment Plan Summary: Daily contact with patient to assess and evaluate symptoms and progress in treatment and Medication management  Will continue the following home medications at this time:             -Lipitor 20 mg p.o. daily at bedtime for hypercholesterolemia             -Cogentin 1 mg p.o. twice daily for potential EPS              -Klonopin 1 mg p.o. twice daily for anxiety             -Depakote ER 500 mg p.o. daily/every morning for mood stability             -Depakote ER 1000 mg p.o. daily at bedtime for mood stability   -VPA level of 72  -Start home Seroquel at 300 mg QHS             -Hydroxyzine 100 mg p.o. daily at bedtime for anxiety             -Trazodone 50 mg p.o. daily at bedtime for insomnia             -Vitamin D3 2000 units p.o. daily for vitamin D deficiency             -Protonix 80 mg p.o. daily for GERD (formulary alternative the patient's home medication of omeprazole 40 mg p.o. daily)   Additional as needed medications ordered:             -Tylenol 650 mg p.o. every 6 hours as needed for mild pain             -Maalox/Mylanta 30 mL p.o. every 4 hours as needed for indigestion             -Milk of Magnesia 30 mL p.o. daily as needed for mild constipation  Medical Management Covid negative CMP: K of 3.3 replaced with 40 meq CBC: unremarkable EtOH: <10 UDS: negative TSH: normal A1C: normal, BMI WNL Lipids: TG of 365, HDL of 33 EKG: NSR, Qtc of 428  Dispo: potential discharge to ALF if patient is truly at baseline and they are amenable to safety  plan. Must check with legal guardian and get approval.    Carlyn ReichertNick Tanairi Cypert, MD 07/31/2021 12:58 PM

## 2021-07-31 NOTE — ED Provider Notes (Signed)
Called and discussed case with Dr. Renaye Rakers, EDP on psych.  Louis Turner ingested a piece of a plastic spoon yesterday requiring transport to the emergency department.  An abdominal film and chest x-ray were both negative.  He was transported back to the behavioral health urgent care.  Unfortunately, today the patient reported bloody stools later in the afternoon and also reported that he has been eating paperclips.  The patient reports some abdominal pain.  He denies other physical symptoms, namely shortness of breath, lightheadedness, chest pain.   The need for a 1:1 sitter was discussed given the patient's command auditory hallucinations and history of suicide attempts.   Carlyn Reichert, MD PGY-1

## 2021-07-31 NOTE — ED Notes (Signed)
Report given to Charge Nurse Pattricia Boss, [facility].  [facility] Transport requested. Pt is IVC.

## 2021-07-31 NOTE — ED Notes (Signed)
Pt stated to this nurse when admin meds to another Pt, "I am ready to leave. I am voluntary". Informed the provider via secure chat. Pt became agitated when he was told he did not have any prescribed meds to be admin this morning. Explained to the Pt that this nurse would look to see if anything has been ordered to admin. Safety maintained and will continue to monitor.

## 2021-08-01 ENCOUNTER — Other Ambulatory Visit: Payer: Self-pay

## 2021-08-01 ENCOUNTER — Ambulatory Visit (HOSPITAL_COMMUNITY)
Admission: EM | Admit: 2021-08-01 | Discharge: 2021-08-03 | Disposition: A | Discharge status: Home / Self Care | Payer: No Typology Code available for payment source | Attending: Nurse Practitioner | Admitting: Nurse Practitioner

## 2021-08-01 ENCOUNTER — Emergency Department (HOSPITAL_COMMUNITY): Payer: Medicaid Other

## 2021-08-01 ENCOUNTER — Emergency Department (HOSPITAL_COMMUNITY)
Admission: EM | Admit: 2021-08-01 | Discharge: 2021-08-01 | Disposition: A | Discharge status: Other Acute Inpatient Hospital | Payer: Medicaid Other | Attending: Emergency Medicine | Admitting: Emergency Medicine

## 2021-08-01 ENCOUNTER — Encounter (HOSPITAL_COMMUNITY): Payer: Self-pay

## 2021-08-01 DIAGNOSIS — Z79899 Other long term (current) drug therapy: Secondary | ICD-10-CM | POA: Insufficient documentation

## 2021-08-01 DIAGNOSIS — T185XXA Foreign body in anus and rectum, initial encounter: Secondary | ICD-10-CM | POA: Diagnosis present

## 2021-08-01 DIAGNOSIS — R451 Restlessness and agitation: Secondary | ICD-10-CM | POA: Insufficient documentation

## 2021-08-01 DIAGNOSIS — R109 Unspecified abdominal pain: Secondary | ICD-10-CM | POA: Insufficient documentation

## 2021-08-01 DIAGNOSIS — F603 Borderline personality disorder: Secondary | ICD-10-CM | POA: Insufficient documentation

## 2021-08-01 DIAGNOSIS — K921 Melena: Secondary | ICD-10-CM | POA: Insufficient documentation

## 2021-08-01 DIAGNOSIS — E559 Vitamin D deficiency, unspecified: Secondary | ICD-10-CM | POA: Insufficient documentation

## 2021-08-01 DIAGNOSIS — F419 Anxiety disorder, unspecified: Secondary | ICD-10-CM | POA: Insufficient documentation

## 2021-08-01 DIAGNOSIS — X58XXXA Exposure to other specified factors, initial encounter: Secondary | ICD-10-CM | POA: Insufficient documentation

## 2021-08-01 DIAGNOSIS — R441 Visual hallucinations: Secondary | ICD-10-CM | POA: Insufficient documentation

## 2021-08-01 DIAGNOSIS — R454 Irritability and anger: Secondary | ICD-10-CM | POA: Insufficient documentation

## 2021-08-01 DIAGNOSIS — G4700 Insomnia, unspecified: Secondary | ICD-10-CM | POA: Insufficient documentation

## 2021-08-01 DIAGNOSIS — E7800 Pure hypercholesterolemia, unspecified: Secondary | ICD-10-CM | POA: Insufficient documentation

## 2021-08-01 DIAGNOSIS — R45850 Homicidal ideations: Secondary | ICD-10-CM | POA: Insufficient documentation

## 2021-08-01 DIAGNOSIS — K219 Gastro-esophageal reflux disease without esophagitis: Secondary | ICD-10-CM | POA: Insufficient documentation

## 2021-08-01 DIAGNOSIS — R45851 Suicidal ideations: Secondary | ICD-10-CM | POA: Insufficient documentation

## 2021-08-01 DIAGNOSIS — Z91040 Latex allergy status: Secondary | ICD-10-CM | POA: Diagnosis not present

## 2021-08-01 DIAGNOSIS — R440 Auditory hallucinations: Secondary | ICD-10-CM | POA: Insufficient documentation

## 2021-08-01 DIAGNOSIS — Z20822 Contact with and (suspected) exposure to covid-19: Secondary | ICD-10-CM | POA: Insufficient documentation

## 2021-08-01 DIAGNOSIS — F32A Depression, unspecified: Secondary | ICD-10-CM | POA: Insufficient documentation

## 2021-08-01 DIAGNOSIS — Z560 Unemployment, unspecified: Secondary | ICD-10-CM | POA: Insufficient documentation

## 2021-08-01 MED ORDER — ATORVASTATIN CALCIUM 10 MG PO TABS
20.0000 mg | ORAL_TABLET | Freq: Every day | ORAL | Status: DC
  Administered 2021-08-02: 20 mg via ORAL
  Filled 2021-08-01 (×2): qty 2

## 2021-08-01 MED ORDER — LORAZEPAM 2 MG/ML IJ SOLN
2.0000 mg | Freq: Four times a day (QID) | INTRAMUSCULAR | Status: DC | PRN
  Administered 2021-08-02: 2 mg via INTRAMUSCULAR
  Filled 2021-08-01: qty 1

## 2021-08-01 MED ORDER — PANTOPRAZOLE SODIUM 40 MG PO TBEC
80.0000 mg | DELAYED_RELEASE_TABLET | Freq: Every day | ORAL | Status: DC
  Filled 2021-08-01 (×2): qty 2

## 2021-08-01 MED ORDER — HYDROXYZINE HCL 25 MG PO TABS
100.0000 mg | ORAL_TABLET | Freq: Every day | ORAL | Status: DC
  Administered 2021-08-02: 100 mg via ORAL
  Filled 2021-08-01 (×2): qty 4

## 2021-08-01 MED ORDER — ZIPRASIDONE MESYLATE 20 MG IM SOLR
INTRAMUSCULAR | Status: AC
  Administered 2021-08-01: 20 mg via INTRAMUSCULAR
  Filled 2021-08-01: qty 20

## 2021-08-01 MED ORDER — ACETAMINOPHEN 325 MG PO TABS
650.0000 mg | ORAL_TABLET | Freq: Four times a day (QID) | ORAL | Status: DC | PRN
Start: 2021-08-01 — End: 2021-08-03
  Administered 2021-08-02: 650 mg via ORAL
  Filled 2021-08-01: qty 2

## 2021-08-01 MED ORDER — MAGNESIUM HYDROXIDE 400 MG/5ML PO SUSP: 30.0000 mL | Freq: Every day | ORAL | Status: DC | PRN

## 2021-08-01 MED ORDER — DIVALPROEX SODIUM ER 500 MG PO TB24
500.0000 mg | ORAL_TABLET | Freq: Every day | ORAL | Status: DC
  Filled 2021-08-01 (×2): qty 1

## 2021-08-01 MED ORDER — ZIPRASIDONE MESYLATE 20 MG IM SOLR
20.0000 mg | Freq: Two times a day (BID) | INTRAMUSCULAR | Status: DC | PRN
Start: 2021-08-01 — End: 2021-08-03
  Administered 2021-08-02: 20 mg via INTRAMUSCULAR
  Filled 2021-08-01: qty 20

## 2021-08-01 MED ORDER — ALUM & MAG HYDROXIDE-SIMETH 200-200-20 MG/5ML PO SUSP: 30.0000 mL | ORAL | Status: DC | PRN

## 2021-08-01 MED ORDER — QUETIAPINE FUMARATE 300 MG PO TABS
300.0000 mg | ORAL_TABLET | Freq: Every day | ORAL | Status: DC
  Administered 2021-08-02: 300 mg via ORAL
  Filled 2021-08-01 (×2): qty 1

## 2021-08-01 MED ORDER — LORAZEPAM 2 MG/ML IJ SOLN
INTRAMUSCULAR | Status: AC
  Administered 2021-08-01: 2 mg via INTRAMUSCULAR
  Filled 2021-08-01: qty 1

## 2021-08-01 MED ORDER — TRAZODONE HCL 50 MG PO TABS
50.0000 mg | ORAL_TABLET | Freq: Every evening | ORAL | Status: DC | PRN
  Filled 2021-08-01: qty 1

## 2021-08-01 MED ORDER — BENZTROPINE MESYLATE 1 MG PO TABS
1.0000 mg | ORAL_TABLET | Freq: Two times a day (BID) | ORAL | Status: DC
  Administered 2021-08-02: 1 mg via ORAL
  Filled 2021-08-01 (×4): qty 1

## 2021-08-01 MED ORDER — CLONAZEPAM 1 MG PO TABS
1.0000 mg | ORAL_TABLET | Freq: Two times a day (BID) | ORAL | Status: DC
  Administered 2021-08-02 (×2): 1 mg via ORAL
  Filled 2021-08-01 (×6): qty 1

## 2021-08-01 MED ORDER — HYDROXYZINE HCL 25 MG PO TABS: 25.0000 mg | ORAL_TABLET | Freq: Three times a day (TID) | ORAL | Status: DC | PRN

## 2021-08-01 NOTE — Discharge Instructions (Signed)
Come back to ER if you have any worsening blood in stool, abdominal pain or other new concerns.

## 2021-08-01 NOTE — ED Provider Notes (Signed)
[facility] EMERGENCY DEPARTMENT Provider Note   CSN: [id number] Arrival date & time: 08/01/21  0011     History  Chief Complaint  Patient presents with   Foreign Body in Rectum    Louis Turner is a 36 y.o. male.  Presenting to the emergency department with concern for paperclip in rectum.  History obtained from patient, EMS report, review of chart, review of behavioral health urgent care notes.  There was a note in chart that states patient swallowed a paperclip however after obtaining history patient states that he never swallowed a paperclip but did place a paperclip in his rectum.  He did endorse swallowing a plastic spoon.  Seen by Dr. Silverio Lay on 2/17.  Plain film of abdomen negative and sent back to behavioral health urgent care.  Patient states that he noted small streak of blood in stool earlier today.  He denies any ongoing abdominal pain, no nausea or vomiting.  Has had multiple bowel movements.  Did not closely inspected stool for foreign body.  HPI     Home Medications Prior to Admission medications   Medication Sig Start Date End Date Taking? Authorizing Provider  atorvastatin (LIPITOR) 20 MG tablet Take 20 mg by mouth at bedtime. 07/28/21   [provider]  benztropine (COGENTIN) 1 MG tablet Take 1 mg by mouth 2 (two) times daily. 07/28/21   [provider]  clonazePAM (KLONOPIN) 1 MG tablet Take 1 mg by mouth 2 (two) times daily. 07/28/21   [provider]  divalproex (DEPAKOTE ER) 500 MG 24 hr tablet Take 500 mg by mouth daily. 07/28/21   [provider]  hydrOXYzine (ATARAX) 50 MG tablet Take 100 mg by mouth at bedtime. 07/28/21   [provider]  omeprazole (PRILOSEC) 40 MG capsule Take 40 mg by mouth every morning. 07/28/21   [provider]  QUEtiapine (SEROQUEL) 300 MG tablet Take 1 tablet by mouth at bedtime. 07/29/21   [provider]  traZODone (DESYREL) 50 MG tablet Take 50 mg by mouth  at bedtime. 07/28/21   [provider]      Allergies    Latex    Review of Systems   Review of Systems  Gastrointestinal:  Positive for blood in stool.  All other systems reviewed and are negative.  Physical Exam Updated Vital Signs BP 108/68 (BP Location: Right Arm)    Pulse 82    Temp 98.3 F (36.8 C) (Oral)    Resp 18    SpO2 96%  Physical Exam Vitals and nursing note reviewed. Exam conducted with a chaperone present.  Constitutional:      General: He is not in acute distress.    Appearance: He is well-developed.  HENT:     Head: Normocephalic and atraumatic.  Eyes:     Conjunctiva/sclera: Conjunctivae normal.  Cardiovascular:     Rate and Rhythm: Normal rate.     Pulses: Normal pulses.  Pulmonary:     Effort: Pulmonary effort is normal. No respiratory distress.  Abdominal:     Palpations: Abdomen is soft.     Tenderness: There is no abdominal tenderness.  Genitourinary:    Comments: RN chaperone Rectum externally appears normal, on digital rectal exam, stool is brown, there is no blood, no fissures, no palpable foreign body Musculoskeletal:        General: No swelling.     Cervical back: Neck supple.  Skin:    General: Skin is warm and dry.  Capillary Refill: Capillary refill takes less than 2 seconds.  Neurological:     Mental Status: He is alert.  Psychiatric:        Mood and Affect: Mood normal.    ED Results / Procedures / Treatments   Labs (all labs ordered are listed, but only abnormal results are displayed) Labs Reviewed - No data to display  EKG None  Radiology DG Chest [facility] 1 View  Result Date: 07/30/2021 CLINICAL DATA:  Swallowed foreign body. EXAM: PORTABLE CHEST 1 VIEW COMPARISON:  None. FINDINGS: The heart size and mediastinal contours are within normal limits. Both lungs are clear. The visualized skeletal structures are unremarkable. IMPRESSION: No active disease. Electronically Signed   By: Aram Candela M.D.   On:  07/30/2021 22:04   DG Abd 2 Views  Result Date: 08/01/2021 CLINICAL DATA:  Possible metallic foreign body within the rectum EXAM: ABDOMEN - 2 VIEW COMPARISON:  Film from 2 days previous FINDINGS: Scattered large and small bowel gas is noted. No obstructive changes are noted. No patellar foreign body is seen. No bony abnormality is noted. IMPRESSION: No evidence of metallic foreign body. Electronically Signed   By: Alcide Clever M.D.   On: 08/01/2021 03:14   DG Abd Portable 1 View  Result Date: 07/30/2021 CLINICAL DATA:  Swallowed foreign body. EXAM: PORTABLE ABDOMEN - 1 VIEW COMPARISON:  None. FINDINGS: The bowel gas pattern is normal. No radio-opaque calculi or other significant radiographic abnormality are seen. No radiopaque foreign bodies are identified. IMPRESSION: Negative. Electronically Signed   By: Aram Candela M.D.   On: 07/30/2021 22:04    Procedures Procedures    Medications Ordered in ED Medications - No data to display  ED Course/ Medical Decision Making/ A&P                           Medical Decision Making Amount and/or Complexity of Data Reviewed Radiology: ordered.   36 year old male presented to ER with concern for possible foreign body in rectum.  Confirmed history with patient, claimed that he had placed a paperclip in rectum and later noted small blood in stool.  Clarified that this was only small streak of blood.  On rectal exam today stool brown, no blood noted, no foreign body noted.  Plain films obtained, no evidence for foreign body.  If this was in fact a paperclip I would strongly suspect this would be visualized on x-ray.  His abdomen is soft and nontender.  He has had no additional episodes of any blood in stool.  Reviewed laboratory work from 2/17, no anemia or electrolyte derangement.  Given patient has no ongoing symptoms at present, normal vital signs and reassuring exam, do not feel he needs repeat laboratory work completed at this time.  Feel he is  stable to go back to [facility] for further psychiatric care. Discussed with Roselyn Bering NP at [facility]. She will accept patient back.         Final Clinical Impression(s) / ED Diagnoses Final diagnoses:  Foreign body anus/rectum    Rx / DC Orders ED Discharge Orders     None         Milagros Loll, MD 08/01/21 [phone]

## 2021-08-01 NOTE — ED Notes (Signed)
Pt refused all of his ordered morning meds. Informed provider via secure chat. Safety maintained and will continue to monitor.
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
## 2021-08-01 NOTE — ED Notes (Signed)
Patient up to nurses station asking to speak with doctor. Patient also asking for [place] phone number.

## 2021-08-01 NOTE — ED Triage Notes (Signed)
BIB [facility] from [facility]. Per EMS pt has paperclip in his rectum according to [facility] staff with bloody stools. Pt also had a screw washer however they were not able to locate the screw.

## 2021-08-01 NOTE — ED Notes (Signed)
Pt irritable and agitated on the unit. Pt was offered medication and agreed to take them IM. Ativan 2 mg, PRN and Geodon 20 mg, PRN given.

## 2021-08-01 NOTE — ED Notes (Signed)
Pt awake & resting at present, no distress noted.  Respirations even & unlabored.  Monitoring for safety.

## 2021-08-01 NOTE — ED Notes (Signed)
Pt stated to this nurse, "Tell the doctor that I feel like hurting myself". Will inform provider via secure chat. Pt stated this while resting in the bed after this nurse completed admin meds to the Pt beside him.

## 2021-08-01 NOTE — ED Notes (Signed)
Pt refused all night time meds. 

## 2021-08-01 NOTE — ED Notes (Signed)
Pt under IVC, returns from [facility] after complaining of abd. Pain and rectal bleeding after sticking a paperclip up his rectum. [facility] reports rectal exam and x-rays obtained. REsults negative as per ED.  Pt sleeping at present, no distress noted.  Monitoring for safety.
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
## 2021-08-01 NOTE — ED Notes (Signed)
Pt sitting up in bed at present.  No distress noted.  Monitoring for safety.

## 2021-08-01 NOTE — ED Notes (Signed)
Pt becoming agitated and kicking on door on the unit.

## 2021-08-01 NOTE — ED Notes (Signed)
Pt resting on pull out bed in observation area. No s&s of distress observed. Safety maintained and will continue to monitor.  °

## 2021-08-01 NOTE — ED Provider Notes (Signed)
Behavioral Health Progress Note  Date and Time: 08/01/2021 4:16 PM Name: Louis Turner MRN:  [id number]  Subjective: Louis Turner is a 36 y.o. male is under involuntary commitment due to self-harm.  It was reported that patient was expressing thoughts of self harming earlier today.  Patient received agitation protocol at 1530-patient is sedated unable to be assessed at this time.  Patient observed resting quietly. staff to continue to monitor for safety. Support, encouragement and reassurance was provided.   Diagnosis:  Final diagnoses:  None    Total Time spent with patient: 15 minutes  Past Psychiatric History:  Past Medical History: No past medical history on file. No past surgical history on file. Family History: No family history on file. Family Psychiatric  History:  Social History:  Social History   Substance and Sexual Activity  Alcohol Use None     Social History   Substance and Sexual Activity  Drug Use Not on file    Social History   Socioeconomic History   Marital status: Single    Spouse name: Not on file   Number of children: Not on file   Years of education: Not on file   Highest education level: Not on file  Occupational History   Not on file  Tobacco Use   Smoking status: Unknown   Smokeless tobacco: Not on file  Substance and Sexual Activity   Alcohol use: Not on file   Drug use: Not on file   Sexual activity: Not on file  Other Topics Concern   Not on file  Social History Narrative   Not on file   Social Determinants of Health   Financial Resource Strain: Not on file  Food Insecurity: Not on file  Transportation Needs: Not on file  Physical Activity: Not on file  Stress: Not on file  Social Connections: Not on file   SDOH:  SDOH Screenings   Alcohol Screen: Not on file  Depression (PHQ2-9): Not on file  Financial Resource Strain: Not on file  Food Insecurity: Not on file  Housing: Not on file  Physical Activity: Not on file   Social Connections: Not on file  Stress: Not on file  Tobacco Use: Not on file  Transportation Needs: Not on file   Additional Social History:                         Sleep: Good  Appetite:  Good  Current Medications:  Current Facility-Administered Medications  Medication Dose Route Frequency Provider Last Rate Last Admin   acetaminophen (TYLENOL) tablet 650 mg  650 mg Oral Q6H PRN Bobbitt, Shalon E, NP       alum & mag hydroxide-simeth (MAALOX/MYLANTA) 200-200-20 MG/5ML suspension 30 mL  30 mL Oral Q4H PRN Bobbitt, Shalon E, NP       atorvastatin (LIPITOR) tablet 20 mg  20 mg Oral QHS Bobbitt, Shalon E, NP       benztropine (COGENTIN) tablet 1 mg  1 mg Oral BID Bobbitt, Shalon E, NP       clonazePAM (KLONOPIN) tablet 1 mg  1 mg Oral BID Bobbitt, Shalon E, NP       divalproex (DEPAKOTE ER) 24 hr tablet 500 mg  500 mg Oral Daily Bobbitt, Shalon E, NP       hydrOXYzine (ATARAX) tablet 100 mg  100 mg Oral QHS Bobbitt, Shalon E, NP       hydrOXYzine (ATARAX) tablet 25 mg  25 mg Oral  TID PRN Bobbitt, Shalon E, NP       LORazepam (ATIVAN) injection 2 mg  2 mg Intramuscular Q6H PRN White, Patrice L, NP   2 mg at 08/01/21 1328   magnesium hydroxide (MILK OF MAGNESIA) suspension 30 mL  30 mL Oral Daily PRN Bobbitt, Shalon E, NP       pantoprazole (PROTONIX) EC tablet 80 mg  80 mg Oral Daily Bobbitt, Shalon E, NP       QUEtiapine (SEROQUEL) tablet 300 mg  300 mg Oral QHS Bobbitt, Shalon E, NP       traZODone (DESYREL) tablet 50 mg  50 mg Oral QHS PRN Bobbitt, Shalon E, NP       ziprasidone (GEODON) injection 20 mg  20 mg Intramuscular Q12H PRN White, Patrice L, NP   20 mg at 08/01/21 1328   Current Outpatient Medications  Medication Sig Dispense Refill   atorvastatin (LIPITOR) 20 MG tablet Take 20 mg by mouth at bedtime.     benztropine (COGENTIN) 1 MG tablet Take 1 mg by mouth 2 (two) times daily.     clonazePAM (KLONOPIN) 1 MG tablet Take 1 mg by mouth 2 (two) times daily.      divalproex (DEPAKOTE ER) 500 MG 24 hr tablet Take 500 mg by mouth daily.     hydrOXYzine (ATARAX) 50 MG tablet Take 100 mg by mouth at bedtime.     omeprazole (PRILOSEC) 40 MG capsule Take 40 mg by mouth every morning.     QUEtiapine (SEROQUEL) 300 MG tablet Take 1 tablet by mouth at bedtime.     traZODone (DESYREL) 50 MG tablet Take 50 mg by mouth at bedtime.      Labs  Lab Results:  Admission on 07/30/2021, Discharged on 07/30/2021  Component Date Value Ref Range Status   Sodium 07/30/2021 139  135 - 145 mmol/L Final   Potassium 07/30/2021 4.2  3.5 - 5.1 mmol/L Final   DELTA CHECK NOTED   Chloride 07/30/2021 106  98 - 111 mmol/L Final   CO2 07/30/2021 23  22 - 32 mmol/L Final   Glucose, Bld 07/30/2021 118 (H)  70 - 99 mg/dL Final   Glucose reference range applies only to samples taken after fasting for at least 8 hours.   BUN 07/30/2021 11  6 - 20 mg/dL Final   Creatinine, Ser 07/30/2021 0.89  0.61 - 1.24 mg/dL Final   Calcium 07/30/2021 9.5  8.9 - 10.3 mg/dL Final   GFR, Estimated 07/30/2021 >60  >60 mL/min Final   Comment: (NOTE) Calculated using the CKD-EPI Creatinine Equation (2021)    Anion gap 07/30/2021 10  5 - 15 Final   Performed at [facility], [place] [address] [place], [place] [address]   WBC 07/30/2021 6.1  4.0 - 10.5 K/uL Final   RBC 07/30/2021 5.21  4.22 - 5.81 MIL/uL Final   Hemoglobin 07/30/2021 14.9  13.0 - 17.0 g/dL Final   HCT 07/30/2021 45.2  39.0 - 52.0 % Final   MCV 07/30/2021 86.8  80.0 - 100.0 fL Final   MCH 07/30/2021 28.6  26.0 - 34.0 pg Final   MCHC 07/30/2021 33.0  30.0 - 36.0 g/dL Final   RDW 07/30/2021 13.2  11.5 - 15.5 % Final   Platelets 07/30/2021 269  150 - 400 K/uL Final   nRBC 07/30/2021 0.0  0.0 - 0.2 % Final   Performed at [facility], [place] [address] [place], [place] [address]   Troponin I (  High Sensitivity) 07/30/2021 3  <18 ng/L Final   Comment: (NOTE) Elevated high sensitivity troponin I (hsTnI) values and significant   changes across serial measurements may suggest ACS but many other  chronic and acute conditions are known to elevate hsTnI results.  Refer to the Links section for chest pain algorithms and additional  guidance. Performed at [facility], [place] [address] [place], [place] [address]    Alcohol, Ethyl (B) 07/30/2021 <10  <10 mg/dL Final   Comment: (NOTE) Lowest detectable limit for serum alcohol is 10 mg/dL.  For medical purposes only. Performed at [facility], [place] [address] [place], [place] [address]    SARS Coronavirus 2 by RT PCR 07/30/2021 NEGATIVE  NEGATIVE Final   Comment: (NOTE) SARS-CoV-2 target nucleic acids are NOT DETECTED.  The SARS-CoV-2 RNA is generally detectable in upper respiratory specimens during the acute phase of infection. The lowest concentration of SARS-CoV-2 viral copies this assay can detect is 138 copies/mL. A negative result does not preclude SARS-Cov-2 infection and should not be used as the sole basis for treatment or other patient management decisions. A negative result may occur with  improper specimen collection/handling, submission of specimen other than nasopharyngeal swab, presence of viral mutation(s) within the areas targeted by this assay, and inadequate number of viral copies(<138 copies/mL). A negative result must be combined with clinical observations, patient history, and epidemiological information. The expected result is Negative.  Fact Sheet for Patients:  EntrepreneurPulse.com.au  Fact Sheet for Healthcare Providers:  IncredibleEmployment.be  This test is no                          t yet approved or cleared by the [place] FDA and  has been authorized for detection and/or diagnosis of SARS-CoV-2 by FDA under an Emergency Use Authorization (EUA). This EUA will remain  in effect (meaning this test can be used) for the duration of the COVID-19 declaration under Section  564(b)(1) of the Act, 21 U.S.C.section 360bbb-3(b)(1), unless the authorization is terminated  or revoked sooner.       Influenza A by PCR 07/30/2021 NEGATIVE  NEGATIVE Final   Influenza B by PCR 07/30/2021 NEGATIVE  NEGATIVE Final   Comment: (NOTE) The Xpert Xpress SARS-CoV-2/FLU/RSV plus assay is intended as an aid in the diagnosis of influenza from Nasopharyngeal swab specimens and should not be used as a sole basis for treatment. Nasal washings and aspirates are unacceptable for Xpert Xpress SARS-CoV-2/FLU/RSV testing.  Fact Sheet for Patients: EntrepreneurPulse.com.au  Fact Sheet for Healthcare Providers: IncredibleEmployment.be  This test is not yet approved or cleared by the [place] FDA and has been authorized for detection and/or diagnosis of SARS-CoV-2 by FDA under an Emergency Use Authorization (EUA). This EUA will remain in effect (meaning this test can be used) for the duration of the COVID-19 declaration under Section 564(b)(1) of the Act, 21 U.S.C. section 360bbb-3(b)(1), unless the authorization is terminated or revoked.  Performed at [facility], [place] [address], [place], [place] [address]   Admission on 07/29/2021, Discharged on 07/30/2021  Component Date Value Ref Range Status   SARS Coronavirus 2 by RT PCR 07/30/2021 NEGATIVE  NEGATIVE Final   Comment: (NOTE) SARS-CoV-2 target nucleic acids are NOT DETECTED.  The SARS-CoV-2 RNA is generally detectable in upper respiratory specimens during the acute phase of infection. The lowest concentration of SARS-CoV-2 viral copies this assay can detect is 138 copies/mL. A negative  result does not preclude SARS-Cov-2 infection and should not be used as the sole basis for treatment or other patient management decisions. A negative result may occur with  improper specimen collection/handling, submission of specimen other than nasopharyngeal swab, presence of viral  mutation(s) within the areas targeted by this assay, and inadequate number of viral copies(<138 copies/mL). A negative result must be combined with clinical observations, patient history, and epidemiological information. The expected result is Negative.  Fact Sheet for Patients:  EntrepreneurPulse.com.au  Fact Sheet for Healthcare Providers:  IncredibleEmployment.be  This test is no                          t yet approved or cleared by the [place] FDA and  has been authorized for detection and/or diagnosis of SARS-CoV-2 by FDA under an Emergency Use Authorization (EUA). This EUA will remain  in effect (meaning this test can be used) for the duration of the COVID-19 declaration under Section 564(b)(1) of the Act, 21 U.S.C.section 360bbb-3(b)(1), unless the authorization is terminated  or revoked sooner.       Influenza A by PCR 07/30/2021 NEGATIVE  NEGATIVE Final   Influenza B by PCR 07/30/2021 NEGATIVE  NEGATIVE Final   Comment: (NOTE) The Xpert Xpress SARS-CoV-2/FLU/RSV plus assay is intended as an aid in the diagnosis of influenza from Nasopharyngeal swab specimens and should not be used as a sole basis for treatment. Nasal washings and aspirates are unacceptable for Xpert Xpress SARS-CoV-2/FLU/RSV testing.  Fact Sheet for Patients: EntrepreneurPulse.com.au  Fact Sheet for Healthcare Providers: IncredibleEmployment.be  This test is not yet approved or cleared by the [place] FDA and has been authorized for detection and/or diagnosis of SARS-CoV-2 by FDA under an Emergency Use Authorization (EUA). This EUA will remain in effect (meaning this test can be used) for the duration of the COVID-19 declaration under Section 564(b)(1) of the Act, 21 U.S.C. section 360bbb-3(b)(1), unless the authorization is terminated or revoked.  Performed at [facility], [place] [address] [place] [address]    SARS Coronavirus 2 Ag 07/30/2021 Negative  Negative Preliminary   WBC 07/30/2021 5.9  4.0 - 10.5 K/uL Final   RBC 07/30/2021 4.79  4.22 - 5.81 MIL/uL Final   Hemoglobin 07/30/2021 13.7  13.0 - 17.0 g/dL Final   HCT 07/30/2021 41.8  39.0 - 52.0 % Final   MCV 07/30/2021 87.3  80.0 - 100.0 fL Final   MCH 07/30/2021 28.6  26.0 - 34.0 pg Final   MCHC 07/30/2021 32.8  30.0 - 36.0 g/dL Final   RDW 07/30/2021 13.2  11.5 - 15.5 % Final   Platelets 07/30/2021 228  150 - 400 K/uL Final   nRBC 07/30/2021 0.0  0.0 - 0.2 % Final   Neutrophils Relative % 07/30/2021 43  % Final   Neutro Abs 07/30/2021 2.6  1.7 - 7.7 K/uL Final   Lymphocytes Relative 07/30/2021 50  % Final   Lymphs Abs 07/30/2021 2.9  0.7 - 4.0 K/uL Final   Monocytes Relative 07/30/2021 6  % Final   Monocytes Absolute 07/30/2021 0.4  0.1 - 1.0 K/uL Final   Eosinophils Relative 07/30/2021 1  % Final   Eosinophils Absolute 07/30/2021 0.0  0.0 - 0.5 K/uL Final   Basophils Relative 07/30/2021 0  % Final   Basophils Absolute 07/30/2021 0.0  0.0 - 0.1 K/uL Final   Immature Granulocytes 07/30/2021 0  % Final   Abs Immature Granulocytes 07/30/2021  0.02  0.00 - 0.07 K/uL Final   Performed at [facility], [place] [address], [place], [place] [address]   Sodium 07/30/2021 142  135 - 145 mmol/L Final   Potassium 07/30/2021 3.3 (L)  3.5 - 5.1 mmol/L Final   Chloride 07/30/2021 107  98 - 111 mmol/L Final   CO2 07/30/2021 26  22 - 32 mmol/L Final   Glucose, Bld 07/30/2021 132 (H)  70 - 99 mg/dL Final   Glucose reference range applies only to samples taken after fasting for at least 8 hours.   BUN 07/30/2021 13  6 - 20 mg/dL Final   Creatinine, Ser 07/30/2021 0.93  0.61 - 1.24 mg/dL Final   Calcium 07/30/2021 9.3  8.9 - 10.3 mg/dL Final   Total Protein 07/30/2021 6.4 (L)  6.5 - 8.1 g/dL Final   Albumin 07/30/2021 3.6  3.5 - 5.0 g/dL Final   AST 07/30/2021 23  15 - 41 U/L Final   ALT 07/30/2021 28  0 - 44 U/L Final   Alkaline  Phosphatase 07/30/2021 76  38 - 126 U/L Final   Total Bilirubin 07/30/2021 0.2 (L)  0.3 - 1.2 mg/dL Final   GFR, Estimated 07/30/2021 >60  >60 mL/min Final   Comment: (NOTE) Calculated using the CKD-EPI Creatinine Equation (2021)    Anion gap 07/30/2021 9  5 - 15 Final   Performed at [facility], [place] [address], [place], [place] [address]   Hgb A1c MFr Bld 07/30/2021 5.4  4.8 - 5.6 % Final   Comment: (NOTE) Pre diabetes:          5.7%-6.4%  Diabetes:              >6.4%  Glycemic control for   <7.0% adults with diabetes    Mean Plasma Glucose 07/30/2021 108.28  mg/dL Final   Performed at [facility], [place] [address] [place], [place] [address]   Alcohol, Ethyl (B) 07/30/2021 <10  <10 mg/dL Final   Comment: (NOTE) Lowest detectable limit for serum alcohol is 10 mg/dL.  For medical purposes only. Performed at [facility], [place] [address] [place], [place] [address]    Cholesterol 07/30/2021 144  0 - 200 mg/dL Final   Triglycerides 07/30/2021 365 (H)  <150 mg/dL Final   HDL 07/30/2021 33 (L)  >40 mg/dL Final   Total CHOL/HDL Ratio 07/30/2021 4.4  RATIO Final   VLDL 07/30/2021 73 (H)  0 - 40 mg/dL Final   LDL Cholesterol 07/30/2021 38  0 - 99 mg/dL Final   Comment:        Total Cholesterol/HDL:CHD Risk Coronary Heart Disease Risk Table                     Men   Women  1/2 Average Risk   3.4   3.3  Average Risk       5.0   4.4  2 X Average Risk   9.6   7.1  3 X Average Risk  23.4   11.0        Use the calculated Patient Ratio above and the CHD Risk Table to determine the patient's CHD Risk.        ATP III CLASSIFICATION (LDL):  <100     mg/dL   Optimal  100-129  mg/dL   Near or Above  Optimal  130-159  mg/dL   Borderline  160-189  mg/dL   High  >190     mg/dL   Very High Performed at [place] [address] [place], [place] [address]    TSH 07/30/2021 2.401  0.350 - 4.500 uIU/mL Final   Comment: Performed by a 3rd  Generation assay with a functional sensitivity of <=0.01 uIU/mL. Performed at [facility], [place] [address], [place], [place] [address]    POC Amphetamine UR 07/30/2021 None Detected  NONE DETECTED (Cut Off Level 1000 ng/mL) Final   POC Secobarbital (BAR) 07/30/2021 None Detected  NONE DETECTED (Cut Off Level 300 ng/mL) Final   POC Buprenorphine (BUP) 07/30/2021 None Detected  NONE DETECTED (Cut Off Level 10 ng/mL) Final   POC Oxazepam (BZO) 07/30/2021 None Detected  NONE DETECTED (Cut Off Level 300 ng/mL) Final   POC Cocaine UR 07/30/2021 None Detected  NONE DETECTED (Cut Off Level 300 ng/mL) Final   POC Methamphetamine UR 07/30/2021 None Detected  NONE DETECTED (Cut Off Level 1000 ng/mL) Final   POC Morphine 07/30/2021 None Detected  NONE DETECTED (Cut Off Level 300 ng/mL) Final   POC Oxycodone UR 07/30/2021 None Detected  NONE DETECTED (Cut Off Level 100 ng/mL) Final   POC Methadone UR 07/30/2021 None Detected  NONE DETECTED (Cut Off Level 300 ng/mL) Final   POC Marijuana UR 07/30/2021 None Detected  NONE DETECTED (Cut Off Level 50 ng/mL) Final   SARSCOV2ONAVIRUS 2 AG 07/30/2021 NEGATIVE  NEGATIVE Final   Comment: (NOTE) SARS-CoV-2 antigen NOT DETECTED.   Negative results are presumptive.  Negative results do not preclude SARS-CoV-2 infection and should not be used as the sole basis for treatment or other patient management decisions, including infection  control decisions, particularly in the presence of clinical signs and  symptoms consistent with COVID-19, or in those who have been in contact with the virus.  Negative results must be combined with clinical observations, patient history, and epidemiological information. The expected result is Negative.  Fact Sheet for Patients: HandmadeRecipes.com.cy  Fact Sheet for Healthcare Providers: FuneralLife.at  This test is not yet approved or cleared by the [place] FDA and  has  been authorized for detection and/or diagnosis of SARS-CoV-2 by FDA under an Emergency Use Authorization (EUA).  This EUA will remain in effect (meaning this test can be used) for the duration of  the COV                          ID-19 declaration under Section 564(b)(1) of the Act, 21 U.S.C. section 360bbb-3(b)(1), unless the authorization is terminated or revoked sooner.     Valproic Acid Lvl 07/30/2021 72  50.0 - 100.0 ug/mL Final   Performed at [facility], [place] [address] [place], [place] [address]    Blood Alcohol level:  Lab Results  Component Value Date   ETH <10 07/30/2021   ETH <10 123XX123    Metabolic Disorder Labs: Lab Results  Component Value Date   HGBA1C 5.4 07/30/2021   MPG 108.28 07/30/2021   No results found for: PROLACTIN Lab Results  Component Value Date   CHOL 144 07/30/2021   TRIG 365 (H) 07/30/2021   HDL 33 (L) 07/30/2021   CHOLHDL 4.4 07/30/2021   VLDL 73 (H) 07/30/2021   LDLCALC 38 07/30/2021    Therapeutic Lab Levels: No results found for: LITHIUM Lab Results  Component Value Date   VALPROATE  72 07/30/2021   No components found for:  CBMZ  Physical Findings   Flowsheet Row ED from 08/01/2021 in [facility] Most recent reading at 08/01/2021  5:43 AM ED from 08/01/2021 in [facility] Most recent reading at 08/01/2021 12:39 AM ED from 07/31/2021 in [facility] Most recent reading at 07/31/2021  1:39 AM  C-SSRS RISK CATEGORY High Risk High Risk High Risk        Musculoskeletal  Strength & Muscle Tone: within normal limits Gait & Station: normal Patient leans: N/A  Psychiatric Specialty Exam  Presentation  General Appearance: Bizarre  Eye Contact:Good  Speech:Clear and Coherent  Speech Volume:Normal  Handedness:Right   Mood and Affect  Mood:Anxious; Angry  Affect:Congruent   Thought Process  Thought  Processes:Coherent  Descriptions of Associations:Intact  Orientation:Full (Time, Place and Person)  Thought Content:Logical   Duration of Psychotic Symptoms: Greater than six months   Hallucinations:Hallucinations: Command; Auditory Description of Command Hallucinations: voices telling him to committ suicide Description of Auditory Hallucinations: as above Description of Visual Hallucinations: noen today  Ideas of Reference:None  Suicidal Thoughts:Suicidal Thoughts: Yes, Passive SI Passive Intent and/or Plan: Without Intent; Without Plan; Without Means to Carry Out  Homicidal Thoughts:Homicidal Thoughts: No   Sensorium  Memory:Immediate Fair; Recent Fair; Remote Fair  Judgment:Poor  Insight:Poor   Executive Functions  Concentration:Poor  Attention Span:Poor  Recall:Poor  Fund of Knowledge:Poor  Language:Poor   Psychomotor Activity  Psychomotor Activity:Psychomotor Activity: Normal   Assets  Assets:Housing; Communication Skills   Sleep  Sleep:Sleep: Fair   Nutritional Assessment (For OBS and FBC admissions only) Has the patient had a weight loss or gain of 10 pounds or more in the last 3 months?: No Has the patient had a decrease in food intake/or appetite?: No Does the patient have dental problems?: No Does the patient have eating habits or behaviors that may be indicators of an eating disorder including binging or inducing vomiting?: No Has the patient recently lost weight without trying?: 0 Has the patient been eating poorly because of a decreased appetite?: 0 Malnutrition Screening Tool Score: 0    Physical Exam  Physical Exam Vitals and nursing note reviewed.  Cardiovascular:     Rate and Rhythm: Normal rate and regular rhythm.  Pulmonary:     Effort: Pulmonary effort is normal.  Neurological:     Mental Status: He is oriented to person, place, and time.  Psychiatric:        Mood and Affect: Mood normal.        Behavior: Behavior normal.         Thought Content: Thought content normal.        Judgment: Judgment normal.   ROS Blood pressure 112/88, pulse 86, temperature 98 F (36.7 C), temperature source Oral, resp. rate 18, SpO2 97 %. There is no height or weight on file to calculate BMI.  Treatment Plan Summary: Daily contact with patient to assess and evaluate symptoms and progress in treatment and Medication management  Suicidal ideations Hallucinations  Continue Depakote 500 mg p.o. daily Continue Cogentin 1 mg p.o. twice daily Continue Seroquel 300 mg p.o. nightly Continue Klonopin 1 mg p.o. twice daily  See chart of agitation protocol    Derrill [place], NP 08/01/2021 4:16 PM

## 2021-08-02 NOTE — ED Notes (Signed)
Pt currently resting on pull out bed. No s&s of distress observed. Safety maintained and will continue to monitor.

## 2021-08-02 NOTE — ED Notes (Signed)
Went to check on patient in restroom. Patient was laying in the floor, pants pulled down to his knees. Patient was observed holding the back of his head.  Provider and [facility] notified. Staff assisted patient with scrub bottoms and helped him onto a wheelchair. Patient was then wheeled to his chair bed and staff helped patient get back into bed.

## 2021-08-02 NOTE — ED Notes (Signed)
Pt is trying to hurt himself by biting his finger.

## 2021-08-02 NOTE — ED Notes (Signed)
Pt asking to use the phone to call his guardian. Explained to Pt that he is not allowed to use the phone to make calls. Pt made the statement that he  "was supposed to be in [place] today". Asking staff members "what happened in [place] today?". And that there is "a green light over his head now". Then pt requested to see a provider regarding the "rule that he can't use the phone". Informed provider via secure chat.

## 2021-08-02 NOTE — ED Notes (Signed)
Patient says he has has pain in his stomach but is not able to to describe it. He states that he has not "gone to the bathroom in a few days"I will see if he will take something for his bowels. At this time he is calm and relatively cooperative. Will continue to monitor for safety.

## 2021-08-02 NOTE — ED Notes (Signed)
Pt returned to adult unit after speaking with [employer] Officer.

## 2021-08-02 NOTE — ED Notes (Signed)
Pt received PRN Ativan IM and PRN Geodon IM to deltoids. Security present. Pt accepted IM's without force. Pt requesting, "Where is my other shot? I want to get fucked up and never wake up". This nurse explained to Pt that he received two shots and that was all he had ordered at this time and that we did not want him to not wake up just to relax.   Pt placed his hands on top of the glass windows and acted as if he was going to climb the window. Security present.

## 2021-08-02 NOTE — ED Notes (Signed)
Pt is sleeping quietly.

## 2021-08-02 NOTE — ED Notes (Addendum)
Pt attempting to hurt himself by biting his finger. Standing up in front of his pull out bed at the nurses desk. Staff redirected.

## 2021-08-02 NOTE — ED Notes (Signed)
Pt sitting up at present, no distress noted.  Monitoring for safety.

## 2021-08-02 NOTE — ED Notes (Signed)
Pt was given a muffin 

## 2021-08-02 NOTE — ED Notes (Signed)
Patient asking how long can one go without food and water, then asked if it was possible to overdose on water. When asked patient why he was asking, he stated that "i'm just trying to figure some things out."

## 2021-08-02 NOTE — ED Notes (Signed)
Pt was given hot dinner.

## 2021-08-02 NOTE — ED Notes (Signed)
Pt reached his hand through the nurses desk and grabbed a Pt's folder and began to rip the folder up along with the paperwork. Security called to the observation unit. Folder removed from the Pt. Pt continues to ask to use the phone. Staff has explained to Pt that he is not allowed to use the phone d/t his call to the [facility] and threats made. Pt continues to stand at the nurses desk and make statements to staff, "Y'all told me to eat the spoon". Safety maintained and will continue to monitor.

## 2021-08-02 NOTE — ED Notes (Signed)
Pt refused vitals staff asked pt several times and pt continued to refuse pt stated he was good and did not want vitals done.

## 2021-08-02 NOTE — ED Provider Notes (Signed)
Behavioral Health Progress Note  Date and Time: 08/02/2021 3:01 PM Name: Louis Turner MRN:  [id number]  Subjective:   Louis Turner is a 36 year old male with reported past psychiatric history of borderline personality disorder (reported by patient's AFL provider, no past psychiatric diagnoses present in patient's chart at this time) who presents to the [facility] behavioral health urgent care [facility]) from his AFL via law enforcement voluntarily for walk-in evaluation.  He presented for command auditory hallucinations and suicidal thoughts. Patient is currently a resident at an [facility] (Address: [address], [place], [place]) and has a legal guardian Louis Turner [place], [phone]), previously Louis Turner at [phone].  Earlier this morning the patient detailed plans to kill a federal judge and stated "I am supposed to call in a bomb threat as a diversion". (See 01:57 note from [facility]).  Additionally, the patient experienced agitation this morning an wa given as needed medication for this.  On my interview shortly thereafter, the patient appears lethargic but is resting comfortably in bed.  He continues to report passive suicidal thoughts with no plan and reports continued command AH telling him to commit suicide.  He reports vague homicidal thoughts against the owner of his AFL.  He reports that his mood is "sad".  But denies any physical problems and states that his sleep and appetite are appropriate. He continues to state he will not return to his AFL.    Diagnosis:  Borderline Personality disorder Likely Schizoaffective disorder  Total Time Spent in Direct Patient Care: I personally spent 20 minutes on the unit in direct patient care. The direct patient care time included face-to-face time with the patient, reviewing the patient's chart, communicating with other professionals, and coordinating care. Greater than 50% of this time was spent in counseling or coordinating care  with the patient regarding goals of hospitalization, psycho-education, and discharge planning needs.  Past Psychiatric History: reported past psychiatric history of borderline personality disorder (reported by patient's ALF provider, no past psychiatric diagnoses present in patient's chart at this time).  See HPI for further details regarding patient's past psychiatric history. Past Medical History: No past medical history on file. No past surgical history on file. Family History: No family history on file. Family Psychiatric  History: denies Hx of major mental illness Social History:  Social History   Substance and Sexual Activity  Alcohol Use None     Social History   Substance and Sexual Activity  Drug Use Not on file    Social History   Socioeconomic History   Marital status: Single    Spouse name: Not on file   Number of children: Not on file   Years of education: Not on file   Highest education level: Not on file  Occupational History   Not on file  Tobacco Use   Smoking status: Unknown   Smokeless tobacco: Not on file  Substance and Sexual Activity   Alcohol use: Not on file   Drug use: Not on file   Sexual activity: Not on file  Other Topics Concern   Not on file  Social History Narrative   Not on file   Social Determinants of Health   Financial Resource Strain: Not on file  Food Insecurity: Not on file  Transportation Needs: Not on file  Physical Activity: Not on file  Stress: Not on file  Social Connections: Not on file   SDOH:  SDOH Screenings   Alcohol Screen: Not on file  Depression [id number]): Not on file  Financial Resource Strain: Not on file  Food Insecurity: Not on file  Housing: Not on file  Physical Activity: Not on file  Social Connections: Not on file  Stress: Not on file  Tobacco Use: Not on file  Transportation Needs: Not on file   Additional Social History:     Sleep: Fair  Appetite:  Fair  Current Medications:  Current  Facility-Administered Medications  Medication Dose Route Frequency Provider Last Rate Last Admin   acetaminophen (TYLENOL) tablet 650 mg  650 mg Oral Q6H PRN Bobbitt, Shalon E, NP   650 mg at 08/02/21 0142   alum & mag hydroxide-simeth (MAALOX/MYLANTA) 200-200-20 MG/5ML suspension 30 mL  30 mL Oral Q4H PRN Bobbitt, Shalon E, NP       atorvastatin (LIPITOR) tablet 20 mg  20 mg Oral QHS Bobbitt, Shalon E, NP       benztropine (COGENTIN) tablet 1 mg  1 mg Oral BID Bobbitt, Shalon E, NP       clonazePAM (KLONOPIN) tablet 1 mg  1 mg Oral BID Bobbitt, Shalon E, NP   1 mg at 08/02/21 0158   divalproex (DEPAKOTE ER) 24 hr tablet 500 mg  500 mg Oral Daily Bobbitt, Shalon E, NP       hydrOXYzine (ATARAX) tablet 100 mg  100 mg Oral QHS Bobbitt, Shalon E, NP       hydrOXYzine (ATARAX) tablet 25 mg  25 mg Oral TID PRN Bobbitt, Shalon E, NP       LORazepam (ATIVAN) injection 2 mg  2 mg Intramuscular Q6H PRN White, Patrice L, NP   2 mg at 08/02/21 0809   magnesium hydroxide (MILK OF MAGNESIA) suspension 30 mL  30 mL Oral Daily PRN Bobbitt, Shalon E, NP       pantoprazole (PROTONIX) EC tablet 80 mg  80 mg Oral Daily Bobbitt, Shalon E, NP       QUEtiapine (SEROQUEL) tablet 300 mg  300 mg Oral QHS Bobbitt, Shalon E, NP       traZODone (DESYREL) tablet 50 mg  50 mg Oral QHS PRN Bobbitt, Shalon E, NP       ziprasidone (GEODON) injection 20 mg  20 mg Intramuscular Q12H PRN White, Patrice L, NP   20 mg at 08/02/21 0810   Current Outpatient Medications  Medication Sig Dispense Refill   atorvastatin (LIPITOR) 20 MG tablet Take 20 mg by mouth at bedtime.     benztropine (COGENTIN) 1 MG tablet Take 1 mg by mouth 2 (two) times daily.     clonazePAM (KLONOPIN) 1 MG tablet Take 1 mg by mouth 2 (two) times daily.     divalproex (DEPAKOTE ER) 500 MG 24 hr tablet Take 500 mg by mouth daily.     hydrOXYzine (ATARAX) 50 MG tablet Take 100 mg by mouth at bedtime.     omeprazole (PRILOSEC) 40 MG capsule Take 40 mg by mouth  every morning.     QUEtiapine (SEROQUEL) 300 MG tablet Take 1 tablet by mouth at bedtime.     traZODone (DESYREL) 50 MG tablet Take 50 mg by mouth at bedtime.      Labs  Lab Results:  Admission on 07/30/2021, Discharged on 07/30/2021  Component Date Value Ref Range Status   Sodium 07/30/2021 139  135 - 145 mmol/L Final   Potassium 07/30/2021 4.2  3.5 - 5.1 mmol/L Final   DELTA CHECK NOTED   Chloride 07/30/2021 106  98 - 111 mmol/L Final   CO2 07/30/2021 23  22 - 32 mmol/L Final   Glucose, Bld 07/30/2021 118 (H)  70 - 99 mg/dL Final   Glucose reference range applies only to samples taken after fasting for at least 8 hours.   BUN 07/30/2021 11  6 - 20 mg/dL Final   Creatinine, Ser 07/30/2021 0.89  0.61 - 1.24 mg/dL Final   Calcium 07/30/2021 9.5  8.9 - 10.3 mg/dL Final   GFR, Estimated 07/30/2021 >60  >60 mL/min Final   Comment: (NOTE) Calculated using the CKD-EPI Creatinine Equation (2021)    Anion gap 07/30/2021 10  5 - 15 Final   Performed at [facility], [place] [address] [place], [place] [address]   WBC 07/30/2021 6.1  4.0 - 10.5 K/uL Final   RBC 07/30/2021 5.21  4.22 - 5.81 MIL/uL Final   Hemoglobin 07/30/2021 14.9  13.0 - 17.0 g/dL Final   HCT 07/30/2021 45.2  39.0 - 52.0 % Final   MCV 07/30/2021 86.8  80.0 - 100.0 fL Final   MCH 07/30/2021 28.6  26.0 - 34.0 pg Final   MCHC 07/30/2021 33.0  30.0 - 36.0 g/dL Final   RDW 07/30/2021 13.2  11.5 - 15.5 % Final   Platelets 07/30/2021 269  150 - 400 K/uL Final   nRBC 07/30/2021 0.0  0.0 - 0.2 % Final   Performed at [facility], [place] [address], [place], [place] [address]   Troponin I (High Sensitivity) 07/30/2021 3  <18 ng/L Final   Comment: (NOTE) Elevated high sensitivity troponin I (hsTnI) values and significant  changes across serial measurements may suggest ACS but many other  chronic and acute conditions are known to elevate hsTnI results.  Refer to the Links section for chest pain algorithms and additional   guidance. Performed at [facility], [place] [address], [place], [place] [address]    Alcohol, Ethyl (B) 07/30/2021 <10  <10 mg/dL Final   Comment: (NOTE) Lowest detectable limit for serum alcohol is 10 mg/dL.  For medical purposes only. Performed at [facility], [place] [address], [place],  [address]    SARS Coronavirus 2 by RT PCR 07/30/2021 NEGATIVE  NEGATIVE Final   Comment: (NOTE) SARS-CoV-2 target nucleic acids are NOT DETECTED.  The SARS-CoV-2 RNA is generally detectable in upper respiratory specimens during the acute phase of infection. The lowest concentration of SARS-CoV-2 viral copies this assay can detect is 138 copies/mL. A negative result does not preclude SARS-Cov-2 infection and should not be used as the sole basis for treatment or other patient management decisions. A negative result may occur with  improper specimen collection/handling, submission of specimen other than nasopharyngeal swab, presence of viral mutation(s) within the areas targeted by this assay, and inadequate number of viral copies(<138 copies/mL). A negative result must be combined with clinical observations, patient history, and epidemiological information. The expected result is Negative.  Fact Sheet for Patients:  EntrepreneurPulse.com.au  Fact Sheet for Healthcare Providers:  IncredibleEmployment.be  This test is no                          t yet approved or cleared by the [place] FDA and  has been authorized for detection and/or diagnosis of SARS-CoV-2 by FDA under an Emergency Use Authorization (EUA). This EUA will remain  in effect (meaning this test can be used) for the duration of the COVID-19 declaration under Section 564(b)(1) of the Act, 21 U.S.C.section 360bbb-3(b)(1), unless the authorization  is terminated  or revoked sooner.       Influenza A by PCR 07/30/2021 NEGATIVE  NEGATIVE Final   Influenza B by PCR  07/30/2021 NEGATIVE  NEGATIVE Final   Comment: (NOTE) The Xpert Xpress SARS-CoV-2/FLU/RSV plus assay is intended as an aid in the diagnosis of influenza from Nasopharyngeal swab specimens and should not be used as a sole basis for treatment. Nasal washings and aspirates are unacceptable for Xpert Xpress SARS-CoV-2/FLU/RSV testing.  Fact Sheet for Patients: EntrepreneurPulse.com.au  Fact Sheet for Healthcare Providers: IncredibleEmployment.be  This test is not yet approved or cleared by the [place] FDA and has been authorized for detection and/or diagnosis of SARS-CoV-2 by FDA under an Emergency Use Authorization (EUA). This EUA will remain in effect (meaning this test can be used) for the duration of the COVID-19 declaration under Section 564(b)(1) of the Act, 21 U.S.C. section 360bbb-3(b)(1), unless the authorization is terminated or revoked.  Performed at [facility], [place] [address] [place], [place] [address]   Admission on 07/29/2021, Discharged on 07/30/2021  Component Date Value Ref Range Status   SARS Coronavirus 2 by RT PCR 07/30/2021 NEGATIVE  NEGATIVE Final   Comment: (NOTE) SARS-CoV-2 target nucleic acids are NOT DETECTED.  The SARS-CoV-2 RNA is generally detectable in upper respiratory specimens during the acute phase of infection. The lowest concentration of SARS-CoV-2 viral copies this assay can detect is 138 copies/mL. A negative result does not preclude SARS-Cov-2 infection and should not be used as the sole basis for treatment or other patient management decisions. A negative result may occur with  improper specimen collection/handling, submission of specimen other than nasopharyngeal swab, presence of viral mutation(s) within the areas targeted by this assay, and inadequate number of viral copies(<138 copies/mL). A negative result must be combined with clinical observations, patient history, and  epidemiological information. The expected result is Negative.  Fact Sheet for Patients:  EntrepreneurPulse.com.au  Fact Sheet for Healthcare Providers:  IncredibleEmployment.be  This test is no                          t yet approved or cleared by the [place] FDA and  has been authorized for detection and/or diagnosis of SARS-CoV-2 by FDA under an Emergency Use Authorization (EUA). This EUA will remain  in effect (meaning this test can be used) for the duration of the COVID-19 declaration under Section 564(b)(1) of the Act, 21 U.S.C.section 360bbb-3(b)(1), unless the authorization is terminated  or revoked sooner.       Influenza A by PCR 07/30/2021 NEGATIVE  NEGATIVE Final   Influenza B by PCR 07/30/2021 NEGATIVE  NEGATIVE Final   Comment: (NOTE) The Xpert Xpress SARS-CoV-2/FLU/RSV plus assay is intended as an aid in the diagnosis of influenza from Nasopharyngeal swab specimens and should not be used as a sole basis for treatment. Nasal washings and aspirates are unacceptable for Xpert Xpress SARS-CoV-2/FLU/RSV testing.  Fact Sheet for Patients: EntrepreneurPulse.com.au  Fact Sheet for Healthcare Providers: IncredibleEmployment.be  This test is not yet approved or cleared by the [place] FDA and has been authorized for detection and/or diagnosis of SARS-CoV-2 by FDA under an Emergency Use Authorization (EUA). This EUA will remain in effect (meaning this test can be used) for the duration of the COVID-19 declaration under Section 564(b)(1) of the Act, 21 U.S.C. section 360bbb-3(b)(1), unless the authorization is terminated or revoked.  Performed at [facility], [place],  [place] [address]    SARS Coronavirus 2 Ag 07/30/2021 Negative  Negative Preliminary   WBC 07/30/2021 5.9  4.0 - 10.5 K/uL Final   RBC 07/30/2021 4.79  4.22 - 5.81 MIL/uL Final   Hemoglobin  07/30/2021 13.7  13.0 - 17.0 g/dL Final   HCT 07/30/2021 41.8  39.0 - 52.0 % Final   MCV 07/30/2021 87.3  80.0 - 100.0 fL Final   MCH 07/30/2021 28.6  26.0 - 34.0 pg Final   MCHC 07/30/2021 32.8  30.0 - 36.0 g/dL Final   RDW 07/30/2021 13.2  11.5 - 15.5 % Final   Platelets 07/30/2021 228  150 - 400 K/uL Final   nRBC 07/30/2021 0.0  0.0 - 0.2 % Final   Neutrophils Relative % 07/30/2021 43  % Final   Neutro Abs 07/30/2021 2.6  1.7 - 7.7 K/uL Final   Lymphocytes Relative 07/30/2021 50  % Final   Lymphs Abs 07/30/2021 2.9  0.7 - 4.0 K/uL Final   Monocytes Relative 07/30/2021 6  % Final   Monocytes Absolute 07/30/2021 0.4  0.1 - 1.0 K/uL Final   Eosinophils Relative 07/30/2021 1  % Final   Eosinophils Absolute 07/30/2021 0.0  0.0 - 0.5 K/uL Final   Basophils Relative 07/30/2021 0  % Final   Basophils Absolute 07/30/2021 0.0  0.0 - 0.1 K/uL Final   Immature Granulocytes 07/30/2021 0  % Final   Abs Immature Granulocytes 07/30/2021 0.02  0.00 - 0.07 K/uL Final   Performed at [facility], [place] [address], [place], [place] [address]   Sodium 07/30/2021 142  135 - 145 mmol/L Final   Potassium 07/30/2021 3.3 (L)  3.5 - 5.1 mmol/L Final   Chloride 07/30/2021 107  98 - 111 mmol/L Final   CO2 07/30/2021 26  22 - 32 mmol/L Final   Glucose, Bld 07/30/2021 132 (H)  70 - 99 mg/dL Final   Glucose reference range applies only to samples taken after fasting for at least 8 hours.   BUN 07/30/2021 13  6 - 20 mg/dL Final   Creatinine, Ser 07/30/2021 0.93  0.61 - 1.24 mg/dL Final   Calcium 07/30/2021 9.3  8.9 - 10.3 mg/dL Final   Total Protein 07/30/2021 6.4 (L)  6.5 - 8.1 g/dL Final   Albumin 07/30/2021 3.6  3.5 - 5.0 g/dL Final   AST 07/30/2021 23  15 - 41 U/L Final   ALT 07/30/2021 28  0 - 44 U/L Final   Alkaline Phosphatase 07/30/2021 76  38 - 126 U/L Final   Total Bilirubin 07/30/2021 0.2 (L)  0.3 - 1.2 mg/dL Final   GFR, Estimated 07/30/2021 >60  >60 mL/min Final   Comment: (NOTE) Calculated  using the CKD-EPI Creatinine Equation (2021)    Anion gap 07/30/2021 9  5 - 15 Final   Performed at [facility], [place] [address], [place], [place] [address]   Hgb A1c MFr Bld 07/30/2021 5.4  4.8 - 5.6 % Final   Comment: (NOTE) Pre diabetes:          5.7%-6.4%  Diabetes:              >6.4%  Glycemic control for   <7.0% adults with diabetes    Mean Plasma Glucose 07/30/2021 108.28  mg/dL Final   Performed at [facility], [place] [address] [place], [place] [address]   Alcohol, Ethyl (B) 07/30/2021 <10  <10 mg/dL Final   Comment: (NOTE) Lowest detectable limit for serum alcohol  is 10 mg/dL.  For medical purposes only. Performed at [facility], [place] [address], [place], [place] [address]    Cholesterol 07/30/2021 144  0 - 200 mg/dL Final   Triglycerides 07/30/2021 365 (H)  <150 mg/dL Final   HDL 07/30/2021 33 (L)  >40 mg/dL Final   Total CHOL/HDL Ratio 07/30/2021 4.4  RATIO Final   VLDL 07/30/2021 73 (H)  0 - 40 mg/dL Final   LDL Cholesterol 07/30/2021 38  0 - 99 mg/dL Final   Comment:        Total Cholesterol/HDL:CHD Risk Coronary Heart Disease Risk Table                     Men   Women  1/2 Average Risk   3.4   3.3  Average Risk       5.0   4.4  2 X Average Risk   9.6   7.1  3 X Average Risk  23.4   11.0        Use the calculated Patient Ratio above and the CHD Risk Table to determine the patient's CHD Risk.        ATP III CLASSIFICATION (LDL):  <100     mg/dL   Optimal  100-129  mg/dL   Near or Above                    Optimal  130-159  mg/dL   Borderline  160-189  mg/dL   High  >190     mg/dL   Very High Performed at [facility] [address], [place], [place] [address]    TSH 07/30/2021 2.401  0.350 - 4.500 uIU/mL Final   Comment: Performed by a 3rd Generation assay with a functional sensitivity of <=0.01 uIU/mL. Performed at [facility], [place] [address], [place], [place] [address]    POC Amphetamine UR 07/30/2021 None Detected   NONE DETECTED (Cut Off Level 1000 ng/mL) Final   POC Secobarbital (BAR) 07/30/2021 None Detected  NONE DETECTED (Cut Off Level 300 ng/mL) Final   POC Buprenorphine (BUP) 07/30/2021 None Detected  NONE DETECTED (Cut Off Level 10 ng/mL) Final   POC Oxazepam (BZO) 07/30/2021 None Detected  NONE DETECTED (Cut Off Level 300 ng/mL) Final   POC Cocaine UR 07/30/2021 None Detected  NONE DETECTED (Cut Off Level 300 ng/mL) Final   POC Methamphetamine UR 07/30/2021 None Detected  NONE DETECTED (Cut Off Level 1000 ng/mL) Final   POC Morphine 07/30/2021 None Detected  NONE DETECTED (Cut Off Level 300 ng/mL) Final   POC Oxycodone UR 07/30/2021 None Detected  NONE DETECTED (Cut Off Level 100 ng/mL) Final   POC Methadone UR 07/30/2021 None Detected  NONE DETECTED (Cut Off Level 300 ng/mL) Final   POC Marijuana UR 07/30/2021 None Detected  NONE DETECTED (Cut Off Level 50 ng/mL) Final   SARSCOV2ONAVIRUS 2 AG 07/30/2021 NEGATIVE  NEGATIVE Final   Comment: (NOTE) SARS-CoV-2 antigen NOT DETECTED.   Negative results are presumptive.  Negative results do not preclude SARS-CoV-2 infection and should not be used as the sole basis for treatment or other patient management decisions, including infection  control decisions, particularly in the presence of clinical signs and  symptoms consistent with COVID-19, or in those who have been in contact with the virus.  Negative results must be combined with clinical observations, patient history, and epidemiological information. The expected result is Negative.  Fact Sheet for Patients: HandmadeRecipes.com.cy  Fact Sheet for Healthcare Providers: FuneralLife.at  This test is not yet approved or cleared by the [place] FDA and  has been authorized for detection and/or diagnosis of SARS-CoV-2 by FDA under an Emergency Use Authorization (EUA).  This EUA will remain in effect (meaning this test can be used) for the duration  of  the COV                          ID-19 declaration under Section 564(b)(1) of the Act, 21 U.S.C. section 360bbb-3(b)(1), unless the authorization is terminated or revoked sooner.     Valproic Acid Lvl 07/30/2021 72  50.0 - 100.0 ug/mL Final   Performed at [facility], [place] [address] [place], [place] [address]    Blood Alcohol level:  Lab Results  Component Value Date   ETH <10 07/30/2021   ETH <10 123XX123    Metabolic Disorder Labs: Lab Results  Component Value Date   HGBA1C 5.4 07/30/2021   MPG 108.28 07/30/2021   No results found for: PROLACTIN Lab Results  Component Value Date   CHOL 144 07/30/2021   TRIG 365 (H) 07/30/2021   HDL 33 (L) 07/30/2021   CHOLHDL 4.4 07/30/2021   VLDL 73 (H) 07/30/2021   LDLCALC 38 07/30/2021    Therapeutic Lab Levels: No results found for: LITHIUM Lab Results  Component Value Date   VALPROATE 72 07/30/2021   No components found for:  CBMZ  Physical Findings   Flowsheet Row ED from 08/01/2021 in [facility] Most recent reading at 08/01/2021  5:43 AM ED from 08/01/2021 in [facility] Most recent reading at 08/01/2021 12:39 AM ED from 07/31/2021 in [facility] Most recent reading at 07/31/2021  1:39 AM  C-SSRS RISK CATEGORY High Risk High Risk High Risk        Musculoskeletal  Strength & Muscle Tone: within normal limits Gait & Station: normal Patient leans: N/A  Psychiatric Specialty Exam  Presentation  General Appearance: Bizarre  Eye Contact:Poor  Speech:Clear and Coherent  Speech Volume:Normal  Handedness:Right   Mood and Affect  Mood:Irritable  Affect:Congruent   Thought Process  Thought Processes:Coherent  Descriptions of Associations:Intact  Orientation:Full (Time, Place and Person)  Thought Content:Logical   Duration of Psychotic Symptoms: Greater than six months    Hallucinations:Hallucinations: Command Description of Command Hallucinations: voices telling him to harm himself Description of Visual Hallucinations: none today  Ideas of Reference:None  Suicidal Thoughts:Suicidal Thoughts: Yes, Passive  Homicidal Thoughts:Homicidal Thoughts: No   Sensorium  Memory:Immediate Fair; Recent Fair; Remote Fair  Judgment:Poor  Insight:Poor   Executive Functions  Concentration:Poor  Attention Span:Poor  Recall:Poor  Fund of Knowledge:Poor  Language:Poor   Psychomotor Activity  Psychomotor Activity:Psychomotor Activity: Normal   Assets  Assets:Housing; Social Support   Sleep  Sleep:Sleep: Fair   Physical Exam  Physical Exam Constitutional:      Appearance: He is not toxic-appearing.  Pulmonary:     Effort: Pulmonary effort is normal.  Neurological:     General: No focal deficit present.     Mental Status: He is alert and oriented to person, place, and time.   Review of Systems  Respiratory:  Negative for shortness of breath.   Cardiovascular:  Negative for chest pain.  Gastrointestinal:  Negative for abdominal pain, constipation, diarrhea, nausea and vomiting.  Neurological:  Negative for headaches.   Blood pressure 127/85, pulse 85, temperature  98.2 F (36.8 C), temperature source Oral, resp. rate 18, SpO2 98 %. There is no height or weight on file to calculate BMI.  Treatment Plan Summary: Daily contact with patient to assess and evaluate symptoms and progress in treatment and Medication management Will continue the following home medications at this time:   -Depakote ER 500 mg p.o. daily/every morning for mood stability             -Depakote ER 1000 mg p.o. daily at bedtime for mood stability                         -VPA level of 72             -Continue home Seroquel at 300 mg QHS (restarted 2/19)   -Consider increase tomorrow QHS             -Cogentin 1 mg p.o. twice daily for potential EPS              -Klonopin 1  mg p.o. twice daily for anxiety             -Hydroxyzine 100 mg p.o. daily at bedtime for anxiety             -Trazodone 50 mg p.o. daily at bedtime for insomnia             -Vitamin D3 2000 units p.o. daily for vitamin D deficiency             -Protonix 80 mg p.o. daily for GERD (formulary alternative the patient's home medication of omeprazole 40 mg p.o. daily)  -Lipitor 20 mg p.o. daily at bedtime for hypercholesterolemia   Additional as needed medications ordered:             -Tylenol 650 mg p.o. every 6 hours as needed for mild pain             -Maalox/Mylanta 30 mL p.o. every 4 hours as needed for indigestion             -Milk of Magnesia 30 mL p.o. daily as needed for mild constipation   Medical Management Covid negative CMP: K of 3.3 replaced with 40 meq CBC: unremarkable EtOH: <10 UDS: negative TSH: normal A1C: normal, BMI WNL Lipids: TG of 365, HDL of 33 EKG: NSR, Qtc of 428   Dispo: Spoke with legal guardian today, who states he is searching for level 5 group homes for the patient. He is amenable to hospitalization at [facility], [place], or [facility]. Patient was recently discharged from [facility] and perhaps the patient could return there.   Corky Sox, MD PGY-1

## 2021-08-02 NOTE — ED Provider Notes (Signed)
Patient repeated stated "I want to be discharged or I am going to do whatever it takes to get sent to the ED." Patient then started c/o chest pain. He rates his pain 7/10. Patient has been evaluated in the ED multiple time with similar complaint and cardiac workup was negative. On evaluation patient is sitting comfortably on his bed in no apparent distress. He is able to carry on conversation with Clinical research associate. EKG and V/S stable. Blood pressure 127/85, pulse 85, temperature 98.2 F (36.8 C), temperature source Oral, resp. rate 18, SpO2 98 %. Skin is pink warm and dry. No respiratory distress noted. Patient recent cardiac workup WNL.

## 2021-08-02 NOTE — ED Notes (Signed)
Patient refused 6pm vitals. Patient stated"my vitals are good."

## 2021-08-02 NOTE — ED Notes (Signed)
Staff asked patient if he wanted anything else with his medication to help him sleep. Patient stated he wasn't going to sleep because he had something to do at 9 in the am

## 2021-08-02 NOTE — ED Notes (Signed)
Pt complaining of Left chest pain, rated 7 on pain scale.  12 lead EKG obtained, eval by NP Ene Ajibola. EKG - NSR.  Skin color good, no distress at present.  Vital Signs - WNL.

## 2021-08-02 NOTE — ED Provider Notes (Signed)
This Probation officer was presented with hand written notes by patient stating that he intends to harm a Federal judge in [place] family members. Patient didn't provide name of the [employer]. Writer was also informed that patient has contacted [employer] (Dep of [employer]) to communicate the same threat. On assessment patient confirmed that he his having homicidal ideation with a plan. He will not elaborate on plan. He did however notify nursing staffs that he and a friend are plotting to kill a federal judge. Per Jeanie Sewer, RN patient states "he has a friend that is going to kill the federal judge in [place] today and that he is suppose to call in a bomb threat today as a diversion."  [place] contacted and informed about threats and officer Mullinax came to [facility] to follow up with patient.

## 2021-08-02 NOTE — Progress Notes (Signed)
Patient received IM ativan 2mg  and Geodon 20 IM Due to aggressive behaviors. Patient willing took medications. While receiving medication patient threatened nurses stating, "Y'all going to be getting a shot later." Nursing staff will continue to monitor.

## 2021-08-03 MED ORDER — HYDROXYZINE HCL 25 MG PO TABS
25.0000 mg | ORAL_TABLET | Freq: Three times a day (TID) | ORAL | 0 refills | Status: AC | PRN
Start: 2021-08-03 — End: ?

## 2021-08-03 NOTE — ED Notes (Signed)
Patient is resting quietly with eyes closed no S/S of distress. Patient respirations are even and unlabored. Will contiune to monitor for safety.

## 2021-08-03 NOTE — ED Provider Notes (Incomplete)
Behavioral Health Progress Note  Date and Time: 08/03/2021 11:46 AM Name: Louis Turner MRN:  [id number]  Subjective:  ***Behavioral Health Progress Note  Date and Time: 08/03/2021 11:46 AM Name: Louis Turner MRN:  [id number]  Subjective:   [name] is a 36 year old male with reported past psychiatric history of borderline personality disorder (reported by patient's AFL provider, no past psychiatric diagnoses present in patient's chart at this time) who presents to the [facility] behavioral health urgent care [facility]) from his AFL via law enforcement voluntarily for walk-in evaluation.  He presented for command auditory hallucinations and suicidal thoughts. Patient is currently a resident at an AFL (Address: [address], [place], [name]) and has a legal guardian Adela Glimpse [place], [phone]), previously Zettie Cooley at [phone].  Yesterday at 9:28 PM-patient laying on floor, pants pulled down and was observed holding back of his head. At 8:14 PM patient was asking how long 1 can go without food and water and then asked if it was possible to overdose on water. Pt is seen this morning, patient appears drowsy and lethargic.  Does not open his eyes.  Answers questions and few words or short sentences.  Patient states his mood is still depressed.  He continues to report passive suicidal thoughts with no plan or intent.  He reports command hallucinations AH telling him to hurt himself.  He denies HI.  He reports visual hallucinations but does not explain.  He reports sleeping well last night.  He reports stable appetite.  He denies any physical problems and denies any concerns.  He he states he wants to go home.  Diagnosis:  Borderline Personality disorder Likely Schizoaffective disorder  Total Time Spent in Direct Patient Care: I personally spent 20 minutes on the unit in direct patient care. The direct patient care time included face-to-face time with the patient,  reviewing the patient's chart, communicating with other professionals, and coordinating care. Greater than 50% of this time was spent in counseling or coordinating care with the patient regarding goals of hospitalization, psycho-education, and discharge planning needs.  Past Psychiatric History: reported past psychiatric history of borderline personality disorder (reported by patient's ALF provider, no past psychiatric diagnoses present in patient's chart at this time).  See HPI for further details regarding patient's past psychiatric history. Past Medical History: No past medical history on file. No past surgical history on file. Family History: No family history on file. Family Psychiatric  History: denies Hx of major mental illness Social History:  Social History   Substance and Sexual Activity  Alcohol Use None     Social History   Substance and Sexual Activity  Drug Use Not on file    Social History   Socioeconomic History   Marital status: Single    Spouse name: Not on file   Number of children: Not on file   Years of education: Not on file   Highest education level: Not on file  Occupational History   Not on file  Tobacco Use   Smoking status: Unknown   Smokeless tobacco: Not on file  Substance and Sexual Activity   Alcohol use: Not on file   Drug use: Not on file   Sexual activity: Not on file  Other Topics Concern   Not on file  Social History Narrative   Not on file   Social Determinants of Health   Financial Resource Strain: Not on file  Food Insecurity: Not on file  Transportation Needs: Not on file  Physical Activity: Not  on file  Stress: Not on file  Social Connections: Not on file   SDOH:  SDOH Screenings   Alcohol Screen: Not on file  Depression (PHQ2-9): Not on file  Financial Resource Strain: Not on file  Food Insecurity: Not on file  Housing: Not on file  Physical Activity: Not on file  Social Connections: Not on file  Stress: Not on file   Tobacco Use: Not on file  Transportation Needs: Not on file   Additional Social History:     Sleep: Fair  Appetite:  Fair  Current Medications:  Current Facility-Administered Medications  Medication Dose Route Frequency Provider Last Rate Last Admin   acetaminophen (TYLENOL) tablet 650 mg  650 mg Oral Q6H PRN Bobbitt, Shalon E, NP   650 mg at 08/02/21 0142   alum & mag hydroxide-simeth (MAALOX/MYLANTA) 200-200-20 MG/5ML suspension 30 mL  30 mL Oral Q4H PRN Bobbitt, Shalon E, NP       atorvastatin (LIPITOR) tablet 20 mg  20 mg Oral QHS Bobbitt, Shalon E, NP   20 mg at 08/02/21 2331   benztropine (COGENTIN) tablet 1 mg  1 mg Oral BID Bobbitt, Shalon E, NP   1 mg at 08/02/21 2326   clonazePAM (KLONOPIN) tablet 1 mg  1 mg Oral BID Bobbitt, Shalon E, NP   1 mg at 08/02/21 2329   divalproex (DEPAKOTE ER) 24 hr tablet 500 mg  500 mg Oral Daily Bobbitt, Shalon E, NP       hydrOXYzine (ATARAX) tablet 100 mg  100 mg Oral QHS Bobbitt, Shalon E, NP   100 mg at 08/02/21 2330   hydrOXYzine (ATARAX) tablet 25 mg  25 mg Oral TID PRN Bobbitt, Shalon E, NP       LORazepam (ATIVAN) injection 2 mg  2 mg Intramuscular Q6H PRN White, Patrice L, NP   2 mg at 08/02/21 0809   magnesium hydroxide (MILK OF MAGNESIA) suspension 30 mL  30 mL Oral Daily PRN Bobbitt, Shalon E, NP       pantoprazole (PROTONIX) EC tablet 80 mg  80 mg Oral Daily Bobbitt, Shalon E, NP       QUEtiapine (SEROQUEL) tablet 300 mg  300 mg Oral QHS Bobbitt, Shalon E, NP   300 mg at 08/02/21 2332   traZODone (DESYREL) tablet 50 mg  50 mg Oral QHS PRN Bobbitt, Shalon E, NP       ziprasidone (GEODON) injection 20 mg  20 mg Intramuscular Q12H PRN White, Patrice L, NP   20 mg at 08/02/21 0810   Current Outpatient Medications  Medication Sig Dispense Refill   atorvastatin (LIPITOR) 20 MG tablet Take 20 mg by mouth at bedtime.     benztropine (COGENTIN) 1 MG tablet Take 1 mg by mouth 2 (two) times daily.     clonazePAM (KLONOPIN) 1 MG tablet Take  1 mg by mouth 2 (two) times daily.     divalproex (DEPAKOTE ER) 500 MG 24 hr tablet Take 500 mg by mouth daily.     hydrOXYzine (ATARAX) 25 MG tablet Take 1 tablet (25 mg total) by mouth 3 (three) times daily as needed for anxiety. 30 tablet 0   hydrOXYzine (ATARAX) 50 MG tablet Take 100 mg by mouth at bedtime.     omeprazole (PRILOSEC) 40 MG capsule Take 40 mg by mouth every morning.     QUEtiapine (SEROQUEL) 300 MG tablet Take 1 tablet by mouth at bedtime.     traZODone (DESYREL) 50 MG tablet Take 50 mg  by mouth at bedtime.      Labs  Lab Results:  Admission on 07/30/2021, Discharged on 07/30/2021  Component Date Value Ref Range Status   Sodium 07/30/2021 139  135 - 145 mmol/L Final   Potassium 07/30/2021 4.2  3.5 - 5.1 mmol/L Final   DELTA CHECK NOTED   Chloride 07/30/2021 106  98 - 111 mmol/L Final   CO2 07/30/2021 23  22 - 32 mmol/L Final   Glucose, Bld 07/30/2021 118 (H)  70 - 99 mg/dL Final   Glucose reference range applies only to samples taken after fasting for at least 8 hours.   BUN 07/30/2021 11  6 - 20 mg/dL Final   Creatinine, Ser 07/30/2021 0.89  0.61 - 1.24 mg/dL Final   Calcium 98/92/1194 9.5  8.9 - 10.3 mg/dL Final   GFR, Estimated 07/30/2021 >60  >60 mL/min Final   Comment: (NOTE) Calculated using the CKD-EPI Creatinine Equation (2021)    Anion gap 07/30/2021 10  5 - 15 Final   Performed at [facility], [address], [place], [name] [address]   WBC 07/30/2021 6.1  4.0 - 10.5 K/uL Final   RBC 07/30/2021 5.21  4.22 - 5.81 MIL/uL Final   Hemoglobin 07/30/2021 14.9  13.0 - 17.0 g/dL Final   HCT 14/48/1856 45.2  39.0 - 52.0 % Final   MCV 07/30/2021 86.8  80.0 - 100.0 fL Final   MCH 07/30/2021 28.6  26.0 - 34.0 pg Final   MCHC 07/30/2021 33.0  30.0 - 36.0 g/dL Final   RDW 31/49/7026 13.2  11.5 - 15.5 % Final   Platelets 07/30/2021 269  150 - 400 K/uL Final   nRBC 07/30/2021 0.0  0.0 - 0.2 % Final   Performed at [facility], [address],  [place], [name] [address]   Troponin I (High Sensitivity) 07/30/2021 3  <18 ng/L Final   Comment: (NOTE) Elevated high sensitivity troponin I (hsTnI) values and significant  changes across serial measurements may suggest ACS but many other  chronic and acute conditions are known to elevate hsTnI results.  Refer to the Links section for chest pain algorithms and additional  guidance. Performed at [facility], [address], [place], [name] [address]    Alcohol, Ethyl (B) 07/30/2021 <10  <10 mg/dL Final   Comment: (NOTE) Lowest detectable limit for serum alcohol is 10 mg/dL.  For medical purposes only. Performed at [facility], [address], [place], [name] [address]    SARS Coronavirus 2 by RT PCR 07/30/2021 NEGATIVE  NEGATIVE Final   Comment: (NOTE) SARS-CoV-2 target nucleic acids are NOT DETECTED.  The SARS-CoV-2 RNA is generally detectable in upper respiratory specimens during the acute phase of infection. The lowest concentration of SARS-CoV-2 viral copies this assay can detect is 138 copies/mL. A negative result does not preclude SARS-Cov-2 infection and should not be used as the sole basis for treatment or other patient management decisions. A negative result may occur with  improper specimen collection/handling, submission of specimen other than nasopharyngeal swab, presence of viral mutation(s) within the areas targeted by this assay, and inadequate number of viral copies(<138 copies/mL). A negative result must be combined with clinical observations, patient history, and epidemiological information. The expected result is Negative.  Fact Sheet for Patients:  BloggerCourse.com  Fact Sheet for Healthcare Providers:  SeriousBroker.it  This test is no  t yet approved or cleared by the [place] and  has been authorized for detection and/or diagnosis of SARS-CoV-2 by FDA under  an Emergency Use Authorization (EUA). This EUA will remain  in effect (meaning this test can be used) for the duration of the COVID-19 declaration under Section 564(b)(1) of the Act, 21 U.S.C.section 360bbb-3(b)(1), unless the authorization is terminated  or revoked sooner.       Influenza A by PCR 07/30/2021 NEGATIVE  NEGATIVE Final   Influenza B by PCR 07/30/2021 NEGATIVE  NEGATIVE Final   Comment: (NOTE) The Xpert Xpress SARS-CoV-2/FLU/RSV plus assay is intended as an aid in the diagnosis of influenza from Nasopharyngeal swab specimens and should not be used as a sole basis for treatment. Nasal washings and aspirates are unacceptable for Xpert Xpress SARS-CoV-2/FLU/RSV testing.  Fact Sheet for Patients: BloggerCourse.com  Fact Sheet for Healthcare Providers: SeriousBroker.it  This test is not yet approved or cleared by the [place] FDA and has been authorized for detection and/or diagnosis of SARS-CoV-2 by FDA under an Emergency Use Authorization (EUA). This EUA will remain in effect (meaning this test can be used) for the duration of the COVID-19 declaration under Section 564(b)(1) of the Act, 21 U.S.C. section 360bbb-3(b)(1), unless the authorization is terminated or revoked.  Performed at [facility], [address], [place], [name] [address]   Admission on 07/29/2021, Discharged on 07/30/2021  Component Date Value Ref Range Status   SARS Coronavirus 2 by RT PCR 07/30/2021 NEGATIVE  NEGATIVE Final   Comment: (NOTE) SARS-CoV-2 target nucleic acids are NOT DETECTED.  The SARS-CoV-2 RNA is generally detectable in upper respiratory specimens during the acute phase of infection. The lowest concentration of SARS-CoV-2 viral copies this assay can detect is 138 copies/mL. A negative result does not preclude SARS-Cov-2 infection and should not be used as the sole basis for treatment or other patient management  decisions. A negative result may occur with  improper specimen collection/handling, submission of specimen other than nasopharyngeal swab, presence of viral mutation(s) within the areas targeted by this assay, and inadequate number of viral copies(<138 copies/mL). A negative result must be combined with clinical observations, patient history, and epidemiological information. The expected result is Negative.  Fact Sheet for Patients:  BloggerCourse.com  Fact Sheet for Healthcare Providers:  SeriousBroker.it  This test is no                          t yet approved or cleared by the [place] FDA and  has been authorized for detection and/or diagnosis of SARS-CoV-2 by FDA under an Emergency Use Authorization (EUA). This EUA will remain  in effect (meaning this test can be used) for the duration of the COVID-19 declaration under Section 564(b)(1) of the Act, 21 U.S.C.section 360bbb-3(b)(1), unless the authorization is terminated  or revoked sooner.       Influenza A by PCR 07/30/2021 NEGATIVE  NEGATIVE Final   Influenza B by PCR 07/30/2021 NEGATIVE  NEGATIVE Final   Comment: (NOTE) The Xpert Xpress SARS-CoV-2/FLU/RSV plus assay is intended as an aid in the diagnosis of influenza from Nasopharyngeal swab specimens and should not be used as a sole basis for treatment. Nasal washings and aspirates are unacceptable for Xpert Xpress SARS-CoV-2/FLU/RSV testing.  Fact Sheet for Patients: BloggerCourse.com  Fact Sheet for Healthcare Providers: SeriousBroker.it  This test is not yet approved or cleared by the [place] and has been authorized for  detection and/or diagnosis of SARS-CoV-2 by FDA under an Emergency Use Authorization (EUA). This EUA will remain in effect (meaning this test can be used) for the duration of the COVID-19 declaration under Section 564(b)(1) of the Act,  21 U.S.C. section 360bbb-3(b)(1), unless the authorization is terminated or revoked.  Performed at [facility], [address] [place], [name] [address]    SARS Coronavirus 2 Ag 07/30/2021 Negative  Negative Preliminary   WBC 07/30/2021 5.9  4.0 - 10.5 K/uL Final   RBC 07/30/2021 4.79  4.22 - 5.81 MIL/uL Final   Hemoglobin 07/30/2021 13.7  13.0 - 17.0 g/dL Final   HCT 60/45/4098 41.8  39.0 - 52.0 % Final   MCV 07/30/2021 87.3  80.0 - 100.0 fL Final   MCH 07/30/2021 28.6  26.0 - 34.0 pg Final   MCHC 07/30/2021 32.8  30.0 - 36.0 g/dL Final   RDW 11/91/4782 13.2  11.5 - 15.5 % Final   Platelets 07/30/2021 228  150 - 400 K/uL Final   nRBC 07/30/2021 0.0  0.0 - 0.2 % Final   Neutrophils Relative % 07/30/2021 43  % Final   Neutro Abs 07/30/2021 2.6  1.7 - 7.7 K/uL Final   Lymphocytes Relative 07/30/2021 50  % Final   Lymphs Abs 07/30/2021 2.9  0.7 - 4.0 K/uL Final   Monocytes Relative 07/30/2021 6  % Final   Monocytes Absolute 07/30/2021 0.4  0.1 - 1.0 K/uL Final   Eosinophils Relative 07/30/2021 1  % Final   Eosinophils Absolute 07/30/2021 0.0  0.0 - 0.5 K/uL Final   Basophils Relative 07/30/2021 0  % Final   Basophils Absolute 07/30/2021 0.0  0.0 - 0.1 K/uL Final   Immature Granulocytes 07/30/2021 0  % Final   Abs Immature Granulocytes 07/30/2021 0.02  0.00 - 0.07 K/uL Final   Performed at [facility], [address], [place], [name] [address]   Sodium 07/30/2021 142  135 - 145 mmol/L Final   Potassium 07/30/2021 3.3 (L)  3.5 - 5.1 mmol/L Final   Chloride 07/30/2021 107  98 - 111 mmol/L Final   CO2 07/30/2021 26  22 - 32 mmol/L Final   Glucose, Bld 07/30/2021 132 (H)  70 - 99 mg/dL Final   Glucose reference range applies only to samples taken after fasting for at least 8 hours.   BUN 07/30/2021 13  6 - 20 mg/dL Final   Creatinine, Ser 07/30/2021 0.93  0.61 - 1.24 mg/dL Final   Calcium 30/86/5784 9.3  8.9 - 10.3 mg/dL Final   Total Protein 69/62/9528 6.4 (L)  6.5 -  8.1 g/dL Final   Albumin 41/32/4401 3.6  3.5 - 5.0 g/dL Final   AST 02/72/5366 23  15 - 41 U/L Final   ALT 07/30/2021 28  0 - 44 U/L Final   Alkaline Phosphatase 07/30/2021 76  38 - 126 U/L Final   Total Bilirubin 07/30/2021 0.2 (L)  0.3 - 1.2 mg/dL Final   GFR, Estimated 07/30/2021 >60  >60 mL/min Final   Comment: (NOTE) Calculated using the CKD-EPI Creatinine Equation (2021)    Anion gap 07/30/2021 9  5 - 15 Final   Performed at [facility], [address] [place], [name] [address]   Hgb A1c MFr Bld 07/30/2021 5.4  4.8 - 5.6 % Final   Comment: (NOTE) Pre diabetes:          5.7%-6.4%  Diabetes:              >  6.4%  Glycemic control for   <7.0% adults with diabetes    Mean Plasma Glucose 07/30/2021 108.28  mg/dL Final   Performed at [facility], [address] [place], [place] [address]   Alcohol, Ethyl (B) 07/30/2021 <10  <10 mg/dL Final   Comment: (NOTE) Lowest detectable limit for serum alcohol is 10 mg/dL.  For medical purposes only. Performed at [facility], [address] [place], [place] [address]    Cholesterol 07/30/2021 144  0 - 200 mg/dL Final   Triglycerides 09/81/191402/17/2023 365 (H)  <150 mg/dL Final   HDL 78/29/562102/17/2023 33 (L)  >40 mg/dL Final   Total CHOL/HDL Ratio 07/30/2021 4.4  RATIO Final   VLDL 07/30/2021 73 (H)  0 - 40 mg/dL Final   LDL Cholesterol 07/30/2021 38  0 - 99 mg/dL Final   Comment:        Total Cholesterol/HDL:CHD Risk Coronary Heart Disease Risk Table                     Men   Women  1/2 Average Risk   3.4   3.3  Average Risk       5.0   4.4  2 X Average Risk   9.6   7.1  3 X Average Risk  23.4   11.0        Use the calculated Patient Ratio above and the CHD Risk Table to determine the patient's CHD Risk.        ATP III CLASSIFICATION (LDL):  <100     mg/dL   Optimal  [id number]  mg/dL   Near or Above                    Optimal  130-159  mg/dL   Borderline  [id number]  mg/dL   High  >952>190     mg/dL   Very High Performed  at [facility], [address] [place], [place] [address]    TSH 07/30/2021 2.401  0.350 - 4.500 uIU/mL Final   Comment: Performed by a 3rd Generation assay with a functional sensitivity of <=0.01 uIU/mL. Performed at [facility], [address] [place], [place] [address]    POC Amphetamine UR 07/30/2021 None Detected  NONE DETECTED (Cut Off Level 1000 ng/mL) Final   POC Secobarbital (BAR) 07/30/2021 None Detected  NONE DETECTED (Cut Off Level 300 ng/mL) Final   POC Buprenorphine (BUP) 07/30/2021 None Detected  NONE DETECTED (Cut Off Level 10 ng/mL) Final   POC Oxazepam (BZO) 07/30/2021 None Detected  NONE DETECTED (Cut Off Level 300 ng/mL) Final   POC Cocaine UR 07/30/2021 None Detected  NONE DETECTED (Cut Off Level 300 ng/mL) Final   POC Methamphetamine UR 07/30/2021 None Detected  NONE DETECTED (Cut Off Level 1000 ng/mL) Final   POC Morphine 07/30/2021 None Detected  NONE DETECTED (Cut Off Level 300 ng/mL) Final   POC Oxycodone UR 07/30/2021 None Detected  NONE DETECTED (Cut Off Level 100 ng/mL) Final   POC Methadone UR 07/30/2021 None Detected  NONE DETECTED (Cut Off Level 300 ng/mL) Final   POC Marijuana UR 07/30/2021 None Detected  NONE DETECTED (Cut Off Level 50 ng/mL) Final   SARSCOV2ONAVIRUS 2 AG 07/30/2021 NEGATIVE  NEGATIVE Final   Comment: (NOTE) SARS-CoV-2 antigen NOT DETECTED.   Negative results are presumptive.  Negative results do not preclude SARS-CoV-2 infection and should not be used as the sole basis for treatment or other  patient management decisions, including infection  control decisions, particularly in the presence of clinical signs and  symptoms consistent with COVID-19, or in those who have been in contact with the virus.  Negative results must be combined with clinical observations, patient history, and epidemiological information. The expected result is Negative.  Fact Sheet for Patients: https://www.jennings-kim.com/  Fact Sheet  for Healthcare Providers: https://alexander-rogers.biz/  This test is not yet approved or cleared by the [place] FDA and  has been authorized for detection and/or diagnosis of SARS-CoV-2 by FDA under an Emergency Use Authorization (EUA).  This EUA will remain in effect (meaning this test can be used) for the duration of  the COV                          ID-19 declaration under Section 564(b)(1) of the Act, 21 U.S.C. section 360bbb-3(b)(1), unless the authorization is terminated or revoked sooner.     Valproic Acid Lvl 07/30/2021 72  50.0 - 100.0 ug/mL Final   Performed at [facility], [address], [place], [name] [address]    Blood Alcohol level:  Lab Results  Component Value Date   ETH <10 07/30/2021   ETH <10 07/30/2021    Metabolic Disorder Labs: Lab Results  Component Value Date   HGBA1C 5.4 07/30/2021   MPG 108.28 07/30/2021   No results found for: PROLACTIN Lab Results  Component Value Date   CHOL 144 07/30/2021   TRIG 365 (H) 07/30/2021   HDL 33 (L) 07/30/2021   CHOLHDL 4.4 07/30/2021   VLDL 73 (H) 07/30/2021   LDLCALC 38 07/30/2021    Therapeutic Lab Levels: No results found for: LITHIUM Lab Results  Component Value Date   VALPROATE 72 07/30/2021   No components found for:  CBMZ  Physical Findings   Flowsheet Row ED from 08/01/2021 in [facility] Most recent reading at 08/01/2021  5:43 AM ED from 08/01/2021 in [facility] EMERGENCY DEPARTMENT Most recent reading at 08/01/2021 12:39 AM ED from 07/31/2021 in [facility] Most recent reading at 07/31/2021  1:39 AM  C-SSRS RISK CATEGORY High Risk High Risk High Risk        Musculoskeletal  Strength & Muscle Tone: within normal limits Gait & Station: normal Patient leans: N/A  Psychiatric Specialty Exam  Presentation  General Appearance: Bizarre  Eye Contact:None  Speech:Slow  Speech  Volume:Normal  Handedness:Right   Mood and Affect  Mood:Irritable; Dysphoric  Affect:Congruent; Blunt   Thought Process  Thought Processes:Disorganized  Descriptions of Associations:Intact  Orientation:Partial (not oriented to time. Doesn't know month or year)  Thought Content:-- (passive SI, AVH)   Duration of Psychotic Symptoms: Greater than six months   Hallucinations:Hallucinations: Command Description of Command Hallucinations: voices telling him to harm himself Description of Auditory Hallucinations: As above Description of Visual Hallucinations: Endorses visual hallucination but do not explain  Ideas of Reference:None  Suicidal Thoughts:Suicidal Thoughts: Yes, Passive SI Passive Intent and/or Plan: Without Intent; Without Plan  Homicidal Thoughts:Homicidal Thoughts: No   Sensorium  Memory:Immediate Poor; Recent Poor; Remote Poor  Judgment:Impaired  Insight:Poor   Executive Functions  Concentration:Poor  Attention Span:Poor  Recall:Poor  Fund of Knowledge:Poor  Language:Poor   Psychomotor Activity  Psychomotor Activity:Psychomotor Activity: Normal   Assets  Assets:Housing   Sleep  Sleep:Sleep: Good   Physical Exam  Physical Exam Constitutional:      Appearance: He is not toxic-appearing.  Pulmonary:     Effort: Pulmonary effort is normal.  Neurological:     General: No focal deficit present.     Mental Status: He is alert and oriented to person, place, but not to  time.   Review of Systems  Respiratory:  Negative for shortness of breath.   Cardiovascular:  Negative for chest pain.  Gastrointestinal:  Negative for abdominal pain, constipation, diarrhea, nausea and vomiting.  Neurological:  Negative for headaches.   Blood pressure 108/72, pulse 83, temperature 98.2 F (36.8 C), temperature source Oral, resp. rate 18, SpO2 96 %. There is no height or weight on file to calculate BMI.  Treatment Plan Summary: Daily contact with  patient to assess and evaluate symptoms and progress in treatment and Medication management Will continue the following home medications at this time:   -Depakote ER 500 mg p.o. daily/every morning for mood stability             -Depakote ER 1000 mg p.o. daily at bedtime for mood stability                         -VPA level of 72             -Continue home Seroquel at 300 mg QHS (restarted 2/19)   -Consider increase tomorrow QHS             -Cogentin 1 mg p.o. twice daily for potential EPS              -Klonopin 1 mg p.o. twice daily for anxiety             -Hydroxyzine 100 mg p.o. daily at bedtime for anxiety             -Trazodone 50 mg p.o. daily at bedtime for insomnia             -Vitamin D3 2000 units p.o. daily for vitamin D deficiency             -Protonix 80 mg p.o. daily for GERD (formulary alternative the patient's home medication of omeprazole 40 mg p.o. daily)  -Lipitor 20 mg p.o. daily at bedtime for hypercholesterolemia   Additional as needed medications ordered:             -Tylenol 650 mg p.o. every 6 hours as needed for mild pain             -Maalox/Mylanta 30 mL p.o. every 4 hours as needed for indigestion             -Milk of Magnesia 30 mL p.o. daily as needed for mild constipation   Medical Management Covid negative CMP: K of 3.3 replaced with 40 meq CBC: unremarkable EtOH: <10 UDS: negative TSH: normal A1C: normal, BMI WNL Lipids: TG of 365, HDL of 33 EKG: NSR, Qtc of 428   Dispo: ***   Karsten RoVandana Everlean Bucher, MD PGY-2
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
## 2021-08-03 NOTE — Progress Notes (Signed)
Inpatient Behavioral Health Placement   Pt meets inpatient criteria per Corky Sox, MD. There are no appropriate beds at [facility].  Referral was sent to the following facilities;   Destination Service Provider Address Phone [facility]  [address] [place] [address] [id number] [facility]  [address] [place] [address] [phone] [facility]  [place], [place] [address] [place]  [facility]  [address] [place] [address] [phone]  [facility]  [place], [place] [address] [phone]  [facility]  [address] [place] [address] [phone] [facility]  [address] [place] [address] [phone]  [facility]  [address] [place] [address] [phone] [place], [place] [address] [id number] [facility]  [address], [place] [address] [phone]  [facility]  [address] [place] [address] [phone] [facility]  [place], [place] [address] [phone]  [facility]  [address] [place], [place] [address] [place]  [facility]  [address], [place] [address] [phone] [place]  [place] [place] [address] [phone]    Situation ongoing,  CSW will follow up.   Benjaman Kindler, MSW, LCSWA 08/03/2021  @ 12:24 AM

## 2021-08-03 NOTE — ED Notes (Signed)
Spoke with Cristal Deer, head of group home where pt stays, informed him pt is ready for dc.  Cristal Deer stated he would be to our facility shortly to pick pt up.  Contact number for group home is [phone].  Will continue to monitor for safety.

## 2021-08-03 NOTE — ED Notes (Signed)
Pt in bed resting with eyes closed.  Pt spoke with this writer minimally.  Denies pain and AVH at this time.  States he slept well through the night and would like to continue sleeping. When asked about HI and SI pt replied "I'd rather keep that to myself" to both then smiled.  Breathing is even and unlabored.  Will continue to monitor for safety.

## 2021-08-03 NOTE — Progress Notes (Signed)
CSW sent updated information to [place] via fax.   Benjaman Kindler, MSW, LCSWA 08/03/2021 1:10 AM
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
## 2021-08-03 NOTE — ED Notes (Signed)
Pt discharged back to group facility.  Group facility here to pick pt up.  Upon seeing facility member pt ran from staff.  Group facility member able to talk pt into going home with him.  AVS reviewed. Safety maintained.  Will continue to monitor for safety.

## 2021-08-03 NOTE — Discharge Instructions (Addendum)
Pt is discharged. Legal Guardian informed. [facility] police informed. Prescriptions sent to pharmacy at discharge. Patient agreeable to plan.  Given opportunity to ask questions.  Appears to feel comfortable with discharge denies any current active suicidal or homicidal thought. Patient is also instructed prior to discharge to: Take all medications as prescribed by his/her mental healthcare provider. Report any adverse effects and or reactions from the medicines to his outpatient provider promptly. Patient has been instructed & cautioned: To not engage in alcohol and or illegal drug use while on prescription medicines. In the event of worsening symptoms, patient is instructed to call the crisis hotline, 911 and or go to the nearest ED for appropriate evaluation and treatment of symptoms. To follow-up with his primary care provider for your other medical issues, concerns and or health care  needs.

## 2021-08-03 NOTE — BHH Counselor (Addendum)
TOC supervisor has reached out to patient's living assistant home and is waiting for a response.   Rosezella Florida, MSW Intern 08/03/2021 11:39 AM

## 2021-08-03 NOTE — ED Notes (Signed)
Patient initially  refused his medications but after a small conversation with nurse he said he would take his meds. Will continue to monitor for safety

## 2021-08-23 ENCOUNTER — Encounter (HOSPITAL_COMMUNITY): Payer: Self-pay

## 2021-08-23 ENCOUNTER — Other Ambulatory Visit: Payer: Self-pay

## 2021-08-23 ENCOUNTER — Emergency Department (HOSPITAL_COMMUNITY)
Admission: EM | Admit: 2021-08-23 | Discharge: 2021-08-26 | Disposition: A | Discharge status: Home / Self Care | Payer: Medicaid Other | Attending: Emergency Medicine | Admitting: Emergency Medicine

## 2021-08-23 DIAGNOSIS — S40021A Contusion of right upper arm, initial encounter: Secondary | ICD-10-CM | POA: Insufficient documentation

## 2021-08-23 DIAGNOSIS — W228XXA Striking against or struck by other objects, initial encounter: Secondary | ICD-10-CM | POA: Insufficient documentation

## 2021-08-23 DIAGNOSIS — T6594XA Toxic effect of unspecified substance, undetermined, initial encounter: Secondary | ICD-10-CM | POA: Diagnosis not present

## 2021-08-23 DIAGNOSIS — S40022A Contusion of left upper arm, initial encounter: Secondary | ICD-10-CM | POA: Insufficient documentation

## 2021-08-23 DIAGNOSIS — Z91040 Latex allergy status: Secondary | ICD-10-CM | POA: Insufficient documentation

## 2021-08-23 DIAGNOSIS — R45851 Suicidal ideations: Secondary | ICD-10-CM | POA: Insufficient documentation

## 2021-08-23 DIAGNOSIS — Z20822 Contact with and (suspected) exposure to covid-19: Secondary | ICD-10-CM | POA: Diagnosis not present

## 2021-08-23 DIAGNOSIS — R464 Slowness and poor responsiveness: Secondary | ICD-10-CM | POA: Diagnosis not present

## 2021-08-23 DIAGNOSIS — Y900 Blood alcohol level of less than 20 mg/100 ml: Secondary | ICD-10-CM | POA: Insufficient documentation

## 2021-08-23 DIAGNOSIS — F333 Major depressive disorder, recurrent, severe with psychotic symptoms: Secondary | ICD-10-CM | POA: Insufficient documentation

## 2021-08-23 DIAGNOSIS — F603 Borderline personality disorder: Secondary | ICD-10-CM | POA: Insufficient documentation

## 2021-08-23 DIAGNOSIS — F4323 Adjustment disorder with mixed anxiety and depressed mood: Secondary | ICD-10-CM | POA: Diagnosis not present

## 2021-08-23 DIAGNOSIS — S4992XA Unspecified injury of left shoulder and upper arm, initial encounter: Secondary | ICD-10-CM | POA: Diagnosis present

## 2021-08-23 LAB — CBC WITH DIFFERENTIAL/PLATELET
Abs Immature Granulocytes: 0.03 10*3/uL (ref 0.00–0.07)
Basophils Absolute: 0 10*3/uL (ref 0.0–0.1)
Basophils Relative: 0 %
Eosinophils Absolute: 0 10*3/uL (ref 0.0–0.5)
Eosinophils Relative: 0 %
HCT: 42 % (ref 39.0–52.0)
Hemoglobin: 13.7 g/dL (ref 13.0–17.0)
Immature Granulocytes: 1 %
Lymphocytes Relative: 32 %
Lymphs Abs: 1.6 10*3/uL (ref 0.7–4.0)
MCH: 28.4 pg (ref 26.0–34.0)
MCHC: 32.6 g/dL (ref 30.0–36.0)
MCV: 87.1 fL (ref 80.0–100.0)
Monocytes Absolute: 0.5 10*3/uL (ref 0.1–1.0)
Monocytes Relative: 9 %
Neutro Abs: 3 10*3/uL (ref 1.7–7.7)
Neutrophils Relative %: 58 %
Platelets: 219 10*3/uL (ref 150–400)
RBC: 4.82 MIL/uL (ref 4.22–5.81)
RDW: 13.8 % (ref 11.5–15.5)
WBC: 5.2 10*3/uL (ref 4.0–10.5)
nRBC: 0 % (ref 0.0–0.2)

## 2021-08-23 LAB — COMPREHENSIVE METABOLIC PANEL
ALT: 23 U/L (ref 0–44)
AST: 32 U/L (ref 15–41)
Albumin: 4.2 g/dL (ref 3.5–5.0)
Alkaline Phosphatase: 74 U/L (ref 38–126)
Anion gap: 6 (ref 5–15)
BUN: 14 mg/dL (ref 6–20)
CO2: 27 mmol/L (ref 22–32)
Calcium: 8.9 mg/dL (ref 8.9–10.3)
Chloride: 104 mmol/L (ref 98–111)
Creatinine, Ser: 0.94 mg/dL (ref 0.61–1.24)
GFR, Estimated: >60 mL/min (ref >60)
Glucose, Bld: 98 mg/dL (ref 70–99)
Potassium: 3.9 mmol/L (ref 3.5–5.1)
Sodium: 137 mmol/L (ref 135–145)
Total Bilirubin: 0.2 mg/dL — ABNORMAL LOW (ref 0.3–1.2)
Total Protein: 7.6 g/dL (ref 6.5–8.1)

## 2021-08-23 LAB — RAPID URINE DRUG SCREEN, HOSP PERFORMED
Amphetamines: NOT DETECTED
Barbiturates: NOT DETECTED
Benzodiazepines: NOT DETECTED
Cocaine: NOT DETECTED
Opiates: NOT DETECTED
Tetrahydrocannabinol: NOT DETECTED

## 2021-08-23 LAB — RESP PANEL BY RT-PCR (FLU A&B, COVID) ARPGX2
Influenza A by PCR: NEGATIVE
Influenza B by PCR: NEGATIVE
SARS Coronavirus 2 by RT PCR: NEGATIVE

## 2021-08-23 LAB — ACETAMINOPHEN LEVEL: Acetaminophen (Tylenol), Serum: <10 ug/mL — ABNORMAL LOW (ref 10–30)

## 2021-08-23 LAB — SALICYLATE LEVEL: Salicylate Lvl: <7 mg/dL — ABNORMAL LOW (ref 7.0–30.0)

## 2021-08-23 LAB — ETHANOL: Alcohol, Ethyl (B): <10 mg/dL (ref <10)

## 2021-08-23 MED ORDER — HYDROXYZINE HCL 25 MG PO TABS
100.0000 mg | ORAL_TABLET | Freq: Every day | ORAL | Status: DC
  Administered 2021-08-23 – 2021-08-25 (×3): 100 mg via ORAL
  Filled 2021-08-23 (×3): qty 4

## 2021-08-23 MED ORDER — QUETIAPINE FUMARATE 300 MG PO TABS
300.0000 mg | ORAL_TABLET | Freq: Every day | ORAL | Status: DC
  Administered 2021-08-23: 300 mg via ORAL
  Filled 2021-08-23: qty 1

## 2021-08-23 MED ORDER — ATORVASTATIN CALCIUM 10 MG PO TABS
20.0000 mg | ORAL_TABLET | Freq: Every day | ORAL | Status: DC
  Administered 2021-08-23 – 2021-08-25 (×3): 20 mg via ORAL
  Filled 2021-08-23 (×3): qty 2

## 2021-08-23 MED ORDER — CLONAZEPAM 1 MG PO TABS
1.0000 mg | ORAL_TABLET | Freq: Two times a day (BID) | ORAL | Status: DC
  Administered 2021-08-23 – 2021-08-26 (×6): 1 mg via ORAL
  Filled 2021-08-23 (×6): qty 1

## 2021-08-23 MED ORDER — BENZTROPINE MESYLATE 1 MG PO TABS
1.0000 mg | ORAL_TABLET | Freq: Two times a day (BID) | ORAL | Status: DC
  Administered 2021-08-23 – 2021-08-26 (×6): 1 mg via ORAL
  Filled 2021-08-23 (×6): qty 1

## 2021-08-23 MED ORDER — TRAZODONE HCL 50 MG PO TABS
50.0000 mg | ORAL_TABLET | Freq: Every day | ORAL | Status: DC
  Administered 2021-08-23 – 2021-08-25 (×3): 50 mg via ORAL
  Filled 2021-08-23 (×3): qty 1

## 2021-08-23 MED ORDER — DIVALPROEX SODIUM ER 500 MG PO TB24
500.0000 mg | ORAL_TABLET | Freq: Every day | ORAL | Status: DC
  Filled 2021-08-23: qty 1

## 2021-08-23 MED ORDER — HYDROXYZINE HCL 25 MG PO TABS
25.0000 mg | ORAL_TABLET | Freq: Three times a day (TID) | ORAL | Status: DC | PRN
  Administered 2021-08-24 – 2021-08-26 (×3): 25 mg via ORAL
  Filled 2021-08-23 (×3): qty 1

## 2021-08-23 MED ORDER — PANTOPRAZOLE SODIUM 40 MG PO TBEC
40.0000 mg | DELAYED_RELEASE_TABLET | Freq: Every day | ORAL | Status: DC
  Administered 2021-08-24 – 2021-08-26 (×3): 40 mg via ORAL
  Filled 2021-08-23 (×3): qty 1

## 2021-08-23 NOTE — ED Notes (Signed)
Pt was brought in by guardian Simona Huh. Simona Huh reported to registration that pt was suicidal. Also stated he was discharged from his group home. [facility] information is in pt chart. ?

## 2021-08-23 NOTE — ED Triage Notes (Signed)
Story was told at registration by person dropping him off from group home, that patient was SI, unknown who person was (group home). pt discharged from group home today, he told them he is suicidal, pt agitated and denies SI ? ?Pt states his sister dropped him off. Pt states he need to talk to counselor at [facility] and denies SI. Also states he needs to get to his sister Lisa's in [place]. ?
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
## 2021-08-24 MED ORDER — QUETIAPINE FUMARATE 300 MG PO TABS
600.0000 mg | ORAL_TABLET | Freq: Every day | ORAL | Status: DC
  Administered 2021-08-24 – 2021-08-25 (×2): 600 mg via ORAL
  Filled 2021-08-24 (×2): qty 2

## 2021-08-24 MED ORDER — DIVALPROEX SODIUM ER 500 MG PO TB24
500.0000 mg | ORAL_TABLET | Freq: Two times a day (BID) | ORAL | Status: DC
Start: 2021-08-24 — End: 2021-08-26
  Administered 2021-08-24 – 2021-08-26 (×5): 500 mg via ORAL
  Filled 2021-08-24 (×4): qty 1
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
## 2021-08-24 NOTE — Discharge Instructions (Addendum)
For your behavioral health needs you are advised to continue treatment with your primary care for the time being: ? ?     Louis Scrape, NP ?     [facility] ?     [address] ?     [place], [name] [address] ?     [phone] ? ?You are also advised to keep your previously scheduled intake appointment at the [facility] on Monday, September 06, 2021: ? ?     [facility] ?     [address] ?     [place], [name] [address] ?     [phone] ? ?Additionally, you are advised to follow up with an outpatient therapist.  The [facility] may be able to offer this service to you, in addition to psychiatry.  Ask them about therapy when you go to your appointment.  If they cannot, contact one of the providers listed below to schedule an appointment: ? ?     [facility] ?     [address] ?     [place], [name] [address] ?     [phone] ?     New patients are seen in their walk-in clinic.  Walk-in hours for therapy are Friday from 1:00 pm - 4:00 pm.  Walk-in patients are seen on a first come, first served basis, so try to arrive as early as possible for the best chance of being seen the same day.  Please note that to be eligible for services you must bring an ID or a piece of mail with your name and a [facility] address.  Also, [facility] offers crisis services 24 hours a day, 7 days a week.   ? ?     [facility] ?     [address] ?     [place], [name] [address] ?     [phone] ?     New patients are seen at their walk-in clinic.  Walk-in hours are Monday - Friday from 8:30 am - 12:00 pm, and from 1:00 pm - 2:30 pm.  Walk-in patients are seen on a first come, first served basis, so try to arrive as early as possible for the best chance of being seen the same day. ? ?     [facility] ?     [address] ?     [place], [name] [address] ?     [phone]   ? ?Additionally, you may be eligible for enhanced treatment services.  Stay in touch with Louis Turner, your [facility] Coordinator, to facilitate these services: ? ?     Louis Turner ?     Care Coordinator ?     [facility] ?     [phone] ? ?

## 2021-08-24 NOTE — BH Assessment (Signed)
Attempted tele-assessment but Pt was unable to stay awake to answer questions. Assessment will be completed when Pt is more alert. ? ? ?Louis Turner Louis Turner, [facility], NCC ?Triage Specialist ?[phone] ? ?

## 2021-08-24 NOTE — BH Assessment (Signed)
Per RN, Pt received medication and is currently too somnolent to participate in assessment. Pt will be assessed when he is able to participate. ? ? ?Louis Turner [name], [facility], NCC ?Triage Specialist ?[phone] ? ?
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
## 2021-08-24 NOTE — Progress Notes (Signed)
Pt pacing room stating "I am voluntary, I talked to the woman Dr, this morning and she said she was going to discharge me." ?
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
## 2021-08-24 NOTE — Progress Notes (Signed)
Pt handed this NT a note stating "can you give this to my Mom?" The beginning of the note reads- "I am going to kill myself, as soon as I get out of here." ? ?Note given to RN by this NT ?
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
## 2021-08-25 MED ORDER — ACETAMINOPHEN 325 MG PO TABS
650.0000 mg | ORAL_TABLET | Freq: Four times a day (QID) | ORAL | Status: DC | PRN
  Administered 2021-08-25: 650 mg via ORAL
  Filled 2021-08-25: qty 2
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
## 2021-08-25 NOTE — Progress Notes (Signed)
RNCM left call back information for APS case worker Ignacia Bayley [phone] to attempt to get status of onsite visit with patient.  ?

## 2021-08-25 NOTE — ED Notes (Signed)
Patient alert and oriented this shift.  Medication compliant. No aggression noted.  Redirectable.  Patient can do all ADLs.  ?

## 2021-08-26 ENCOUNTER — Ambulatory Visit (HOSPITAL_COMMUNITY)
Admission: EM | Admit: 2021-08-26 | Discharge: 2021-08-26 | Disposition: A | Discharge status: Other Acute Inpatient Hospital | Payer: No Typology Code available for payment source

## 2021-08-26 ENCOUNTER — Emergency Department (HOSPITAL_COMMUNITY): Payer: Medicaid Other

## 2021-08-26 ENCOUNTER — Ambulatory Visit (HOSPITAL_COMMUNITY)
Admission: RE | Admit: 2021-08-26 | Discharge: 2021-08-26 | Disposition: A | Discharge status: Home / Self Care | Payer: No Typology Code available for payment source | Attending: Psychiatry | Admitting: Psychiatry

## 2021-08-26 ENCOUNTER — Emergency Department (HOSPITAL_COMMUNITY)
Admission: EM | Admit: 2021-08-26 | Discharge: 2021-08-27 | Disposition: A | Discharge status: Home / Self Care | Payer: Medicaid Other | Source: Home / Self Care | Attending: Emergency Medicine | Admitting: Emergency Medicine

## 2021-08-26 ENCOUNTER — Other Ambulatory Visit: Payer: Self-pay

## 2021-08-26 DIAGNOSIS — R464 Slowness and poor responsiveness: Secondary | ICD-10-CM | POA: Insufficient documentation

## 2021-08-26 DIAGNOSIS — Z91040 Latex allergy status: Secondary | ICD-10-CM | POA: Insufficient documentation

## 2021-08-26 DIAGNOSIS — Z20822 Contact with and (suspected) exposure to covid-19: Secondary | ICD-10-CM | POA: Insufficient documentation

## 2021-08-26 DIAGNOSIS — T6594XA Toxic effect of unspecified substance, undetermined, initial encounter: Secondary | ICD-10-CM | POA: Insufficient documentation

## 2021-08-26 LAB — BASIC METABOLIC PANEL
Anion gap: 13 (ref 5–15)
BUN: 18 mg/dL (ref 6–20)
CO2: 23 mmol/L (ref 22–32)
Calcium: 9.4 mg/dL (ref 8.9–10.3)
Chloride: 104 mmol/L (ref 98–111)
Creatinine, Ser: 1.04 mg/dL (ref 0.61–1.24)
GFR, Estimated: >60 mL/min (ref >60)
Glucose, Bld: 89 mg/dL (ref 70–99)
Potassium: 4.1 mmol/L (ref 3.5–5.1)
Sodium: 140 mmol/L (ref 135–145)

## 2021-08-26 LAB — RAPID URINE DRUG SCREEN, HOSP PERFORMED
Amphetamines: NOT DETECTED
Barbiturates: NOT DETECTED
Benzodiazepines: NOT DETECTED
Cocaine: NOT DETECTED
Opiates: NOT DETECTED
Tetrahydrocannabinol: NOT DETECTED

## 2021-08-26 LAB — SALICYLATE LEVEL: Salicylate Lvl: <7 mg/dL — ABNORMAL LOW (ref 7.0–30.0)

## 2021-08-26 LAB — CBC
HCT: 48.2 % (ref 39.0–52.0)
Hemoglobin: 15.9 g/dL (ref 13.0–17.0)
MCH: 28.5 pg (ref 26.0–34.0)
MCHC: 33 g/dL (ref 30.0–36.0)
MCV: 86.4 fL (ref 80.0–100.0)
Platelets: 260 10*3/uL (ref 150–400)
RBC: 5.58 MIL/uL (ref 4.22–5.81)
RDW: 13.5 % (ref 11.5–15.5)
WBC: 7.2 10*3/uL (ref 4.0–10.5)
nRBC: 0 % (ref 0.0–0.2)

## 2021-08-26 LAB — LIPASE, BLOOD: Lipase: 29 U/L (ref 11–51)

## 2021-08-26 LAB — ACETAMINOPHEN LEVEL: Acetaminophen (Tylenol), Serum: <10 ug/mL — ABNORMAL LOW (ref 10–30)

## 2021-08-26 LAB — RESP PANEL BY RT-PCR (FLU A&B, COVID) ARPGX2
Influenza A by PCR: NEGATIVE
Influenza B by PCR: NEGATIVE
SARS Coronavirus 2 by RT PCR: NEGATIVE

## 2021-08-26 LAB — ETHANOL: Alcohol, Ethyl (B): <10 mg/dL (ref <10)

## 2021-08-26 MED ORDER — HYDROXYZINE HCL 25 MG PO TABS
100.0000 mg | ORAL_TABLET | Freq: Every day | ORAL | Status: DC
  Administered 2021-08-27: 100 mg via ORAL
  Filled 2021-08-26: qty 4

## 2021-08-26 MED ORDER — TRAZODONE HCL 50 MG PO TABS
50.0000 mg | ORAL_TABLET | Freq: Every day | ORAL | Status: DC
  Administered 2021-08-27: 50 mg via ORAL
  Filled 2021-08-26: qty 1

## 2021-08-26 MED ORDER — MELATONIN 5 MG PO TABS
10.0000 mg | ORAL_TABLET | Freq: Every day | ORAL | Status: DC
  Administered 2021-08-27: 10 mg via ORAL
  Filled 2021-08-26: qty 2

## 2021-08-26 MED ORDER — BENZTROPINE MESYLATE 1 MG PO TABS
1.0000 mg | ORAL_TABLET | Freq: Two times a day (BID) | ORAL | Status: DC
  Administered 2021-08-27 (×2): 1 mg via ORAL
  Filled 2021-08-26 (×4): qty 1

## 2021-08-26 MED ORDER — DIVALPROEX SODIUM ER 500 MG PO TB24
500.0000 mg | ORAL_TABLET | Freq: Every day | ORAL | Status: DC
  Administered 2021-08-27: 500 mg via ORAL
  Filled 2021-08-26 (×2): qty 1

## 2021-08-26 MED ORDER — PANTOPRAZOLE SODIUM 40 MG PO TBEC
40.0000 mg | DELAYED_RELEASE_TABLET | Freq: Every day | ORAL | Status: DC
  Administered 2021-08-27: 40 mg via ORAL
  Filled 2021-08-26: qty 1

## 2021-08-26 MED ORDER — VITAMIN D 25 MCG (1000 UNIT) PO TABS
2000.0000 [IU] | ORAL_TABLET | Freq: Every day | ORAL | Status: DC
  Administered 2021-08-27: 2000 [IU] via ORAL
  Filled 2021-08-26: qty 2

## 2021-08-26 MED ORDER — CLONAZEPAM 0.5 MG PO TABS
1.0000 mg | ORAL_TABLET | Freq: Two times a day (BID) | ORAL | Status: DC
  Administered 2021-08-27 (×2): 1 mg via ORAL
  Filled 2021-08-26 (×2): qty 2

## 2021-08-26 MED ORDER — QUETIAPINE FUMARATE 300 MG PO TABS
600.0000 mg | ORAL_TABLET | Freq: Every day | ORAL | Status: DC
  Administered 2021-08-27: 600 mg via ORAL
  Filled 2021-08-26 (×2): qty 2

## 2021-08-26 MED ORDER — DOCUSATE SODIUM 100 MG PO CAPS: 100.0000 mg | ORAL_CAPSULE | Freq: Every day | ORAL | Status: DC | PRN

## 2021-08-26 MED ORDER — ATORVASTATIN CALCIUM 10 MG PO TABS
20.0000 mg | ORAL_TABLET | Freq: Every day | ORAL | Status: DC
  Administered 2021-08-27: 20 mg via ORAL
  Filled 2021-08-26: qty 2
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
## 2021-08-26 NOTE — BH Assessment (Addendum)
@  1816, Clinician informed that patient presents to [facility] as a walk-in. Patient to be evaluated by TTS/provider. However, when the [facility] went to call patient from the lobby he was not present.  Front desk staff reported that patient had left the building. Therefore, TTS Clinician/ Provider unable to complete patient's assessment.  ?
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
## 2021-08-26 NOTE — Progress Notes (Signed)
RNCM spoke with Yevonne Pax with the group home re: patient pick up. Mr. Maurine Minister reports that patient is already in the car. ? ? ?

## 2021-08-26 NOTE — ED Notes (Signed)
Patient resting at this time. Calm and cooperative.  ?

## 2021-08-26 NOTE — ED Provider Notes (Signed)
?[facility] EM[facility]ENCY [facility] ?Provider Note ? ? ?CSN: [id number] ?Arrival date & time: 08/26/21  2031 ? ?  ? ?History ? ?Chief Complaint  ?Patient presents with  ? Ingestion  ? ? ?Louis Turner is a 36 y.o. male. ? ?HPI ?Patient presents from our behavioral health urgent care center as a transfer due to concern for possible ingestion of both metallic foreign objects as well as multiple medications.  He does note that he may have taken an excessive amount of his medications, possibly as many as 8 or 9 of his tablets, though he does not know which ones.  He disputes the allegation that he swallowed some metallic objects, though on chart review this is a concern of staff on transfer here.  Patient notes that he lives in a group home, is unhappy there, notes that he has been verbal about his unhappiness.  He denies homicidal or suicidal ideation. ?  ? ?Home Medications ?Prior to Admission medications   ?Medication Sig Start Date End Date Taking? Authorizing Provider  ?atorvastatin (LIPITOR) 20 MG tablet Take 20 mg by mouth at bedtime. 07/28/21   [provider]  ?benztropine (COGENTIN) 1 MG tablet Take 1 mg by mouth 2 (two) times daily. 07/28/21   [provider]  ?Cholecalciferol (VITAMIN D3) 50 MCG (2000 UT) TABS Take 2,000 Units by mouth daily.    [provider]  ?clonazePAM (KLONOPIN) 1 MG tablet Take 1 mg by mouth 2 (two) times daily. 07/28/21   [provider]  ?divalproex (DEPAKOTE ER) 500 MG 24 hr tablet Take 500 mg by mouth in the morning and at bedtime. 07/28/21   [provider]  ?docusate sodium (COLACE) 100 MG capsule Take 100 mg by mouth daily as needed for mild constipation.    [provider]  ?hydrOXYzine (ATARAX) 25 MG tablet Take 1 tablet (25 mg total) by mouth 3 (three) times daily as needed for anxiety. ?Patient not taking: Reported on 08/24/2021 08/03/21   Karsten Rooda, Vandana, MD  ?hydrOXYzine (ATARAX) 50 MG tablet Take 100 mg by  mouth at bedtime. 07/28/21   [provider]  ?Melatonin 10 MG TABS Take 10 mg by mouth at bedtime.    [provider]  ?omeprazole (PRILOSEC) 40 MG capsule Take 40 mg by mouth daily. 07/28/21   [provider]  ?QUEtiapine (SEROQUEL) 300 MG tablet Take 600 mg by mouth at bedtime. 07/29/21   [provider]  ?traZODone (DESYREL) 50 MG tablet Take 50 mg by mouth at bedtime. 07/28/21   [provider]  ?   ? ?Allergies    ?Latex   ? ?Review of Systems   ?Review of Systems  ?Unable to perform ROS: Psychiatric disorder  ? ?Physical Exam ?Updated Vital Signs ?BP (!) 139/117   Pulse 91   Temp 98.6 ?F (37 ?C)   Resp 13   SpO2 99%  ?Physical Exam ?Vitals and nursing note reviewed.  ?Constitutional:   ?   General: He is not in acute distress. ?   Appearance: He is well-developed.  ?HENT:  ?   Head: Normocephalic and atraumatic.  ?Eyes:  ?   Conjunctiva/sclera: Conjunctivae normal.  ?Cardiovascular:  ?   Rate and Rhythm: Normal rate and regular rhythm.  ?Pulmonary:  ?   Effort: Pulmonary effort is normal. No respiratory distress.  ?   Breath sounds: No stridor.  ?Abdominal:  ?   General: There is no distension.  ?   Tenderness: There is no abdominal  tenderness. There is no guarding or rebound.  ?Skin: ?   General: Skin is warm and dry.  ?Neurological:  ?   Mental Status: He is alert and oriented to person, place, and time.  ?Psychiatric:     ?   Behavior: Behavior is slowed and withdrawn.     ?   Thought Content: Thought content does not include suicidal ideation.     ?   Cognition and Memory: Cognition is impaired.  ? ? ?ED Results / Procedures / Treatments   ?Labs ?(all labs ordered are listed, but only abnormal results are displayed) ?Labs Reviewed  ?SALICYLATE LEVEL - Abnormal; Notable for the following components:  ?    Result Value  ? Salicylate Lvl <7.0 (*)   ? All other components within normal limits  ?ACETAMINOPHEN LEVEL - Abnormal; Notable for the following components:   ? Acetaminophen (Tylenol), Serum <10 (*)   ? All other components within normal limits  ?RESP PANEL BY RT-PCR (FLU A&B, COVID) ARPGX2  ?CBC  ?LIPASE, BLOOD  ?ETHANOL  ?RAPID URINE DRUG SCREEN, HOSP PERFORMED  ?BASIC METABOLIC PANEL  ? ? ?EKG ?None ? ?Radiology ?DG Abdomen Acute W/Chest ? ?Result Date: 08/26/2021 ?CLINICAL DATA:  Possible foreign body ingestion. EXAM: DG ABDOMEN ACUTE WITH 1 VIEW CHEST COMPARISON:  Abdominal x-ray 08/01/2021. FINDINGS: No foreign body identified. There is no evidence of dilated bowel loops or free intraperitoneal air. There is a large amount of stool throughout the colon. No suspicious radiopaque calculi or other significant radiographic abnormality is seen. Phleboliths are noted in the pelvis. Heart size and mediastinal contours are within normal limits. Both lungs are clear. IMPRESSION: Negative abdominal radiographs.  No acute cardiopulmonary disease. Electronically Signed   By: Darliss Cheney M.D.   On: 08/26/2021 22:44   ? ?Procedures ?Procedures  ? ? ?Medications Ordered in ED ?Medications - No data to display ? ?ED Course/ Medical Decision Making/ A&P ?This patient presents to the ED for concern of possible overdose, possible ingestion in the context of young adult male with decreased cognitive function, this involves an extensive number of treatment options, and is a complaint that carries with it a high risk of complications and morbidity.  The differential diagnosis includes ingestion, overdose, psychiatric disease ? ? ?Co morbidities that complicate the patient evaluation ? ?Psychiatric disease ? ?Social Determinants of Health: ? ?Psychiatric disease ? ?Additional history obtained: ? ?Additional history and/or information obtained from chart review including behavioral notes with low cognitive function ?After the initial evaluation, orders, including: Labs, x-ray were initiated. ? ? ?Patient placed on Cardiac and Pulse-Oximetry Monitors. ?The patient was maintained on a  cardiac monitor.  The cardiac monitored showed an rhythm of sinus rhythm, 90 unremarkable ?100% room air normal, on pulse oximetry ? ? ?On repeat evaluation of the patient improved ? ?Lab Tests: ? ?I personally interpreted labs.  The pertinent results include: Labs within normal limits, COVID-negative ? ?Imaging Studies ordered: ? ?I independently visualized and interpreted imaging which showed no foreign body ?I agree with the radiologist interpretation ? ?Consultations Obtained: ? ?I requested consultation with the behavioral health and discussed lab and imaging findings as well as pertinent plan - they recommend: Pending ? ?Dispostion / Final MDM: ? ?After consideration of the diagnostic results and the patient's response to treatment, he is appropriate for return to our behavioral health facility.  There is no evidence for ingested foreign body, no distress, no hemodynamic instability, labs are reassuring, though he may have taken a  few extra tablets p.o. normal, has no evidence for adverse effects.  Patient medically cleared for psych evaluation. ? ?Final Clinical Impression(s) / ED Diagnoses ?Final diagnoses:  ?Ingestion of substance, undetermined intent, initial encounter  ? ?  ?Gerhard Munch, MD ?08/26/21 2353 ? ?

## 2021-08-26 NOTE — Progress Notes (Signed)
CSW spoke with Sharlyne Pacas from [facility], he stated they will be picking pt up around 3pm today. RN and MD made aware. ? ?Valentina Shaggy.Lummie Montijo, MSW, LCSWA ?[facility]  Transitions of Care ?Clinical Social Worker I ?Direct Dial: [phone]  Fax: [phone] ?[email or web address]  ?

## 2021-08-26 NOTE — Progress Notes (Signed)
?   08/26/21 1938  ?[facility] Triage Screening (Walk-ins at [facility] only)  ?What Is the Reason for Your Visit/Call Today? Louis Turner is a 36 yo male reporting to [facility] after recent discharge from [facility] for evaluation of suicidal ideation with a recent suicide attempt. Pt reports that after his discharge today from [facility], he was given his belongings and he was trying to kill himself, so he took 8-9 unknown prescription pills that were with his personal belongings. Pt denies HI. Pt admits AVH.  Pt is staying at a group home and the group home leader Thayer Ohm) reports that pt was picking up inanimate objects at the hospital and was swallowing them. Pt stated several times that he hates his group home and wants to be switched to another one. Pt also states that he stayed at [facility] for 5 years. Pt has a history of swallowing foreign objects.  ?How Long Has This Been Causing You Problems? <Week  ?Have You Recently Had Any Thoughts About Hurting Yourself? Yes  ?How long ago did you have thoughts about hurting yourself? today--ingested unknown medication  ?Are You Planning to Commit Suicide/Harm Yourself At This time? Yes  ?Have you Recently Had Thoughts About Hurting Someone Karolee Ohs? Yes  ?How long ago did you have thoughts of harming others? group home leaders state that pt has been threatening them  ?Are You Planning To Harm Someone At This Time? No  ?Are you currently experiencing any auditory, visual or other hallucinations? Yes  ?Please explain the hallucinations you are currently experiencing: pt does not elaborate  ?Have You Used Any Alcohol or Drugs in the Past 24 Hours? No  ?Do you have any current medical co-morbidities that require immediate attention? No  ?Clinician description of patient physical appearance/behavior: pt very agitated and angry at time of assessment  ?What Do You Feel Would Help You the Most Today? Treatment for Depression or other mood problem  ?If access to [facility] was not available, would you  have sought care in the Emergency Department? Yes  ?Determination of Need Emergent (2 hours)  ?Options For Referral ED Referral ?(Per Doran Heater NP pt needs to be transported to ED for medical clearance)  ? ? ?

## 2021-08-26 NOTE — ED Triage Notes (Signed)
Pt brought to ED by [facility] from [facility] for evaluation of possible metal objects. Pt also states he took " 8 or 9 of his pills", unable to name the medication, but states they are for anxiety. States ingestion happened approximately 2.5 hrs ago.  ?
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
## 2021-08-26 NOTE — ED Notes (Signed)
Report to Onset Nurse called by LpN Lily Kocher.  EMS Dispatch called by RN Charleston Ropes.  EMS at bedside to take pt to [facility]. ?

## 2021-08-26 NOTE — Progress Notes (Signed)
Patient ID: Louis Turner, male   DOB: September 19, 1985, 36 y.o.   MRN: [id number] ? ?Patient presented to [facility] as a voluntary walk-in. I was informed they were in the lobby. I was informed that they left without being seen.   ?

## 2021-08-26 NOTE — ED Notes (Addendum)
Louis Turner, the patient's legal guardian gave permission for the patient to be released back to the group home. Patient at first refused to get in the car with the group home but did finally get in the car. Adela Glimpse made aware that patient did ultimately leave with the group home.  Adela Glimpse did add "not to be surprised if the patient returns later tonight." ?
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
## 2021-08-26 NOTE — ED Notes (Signed)
Patient refused vitals.

## 2021-08-27 ENCOUNTER — Emergency Department (HOSPITAL_COMMUNITY)
Admission: EM | Admit: 2021-08-27 | Discharge: 2021-08-31 | Disposition: A | Discharge status: Other Acute Inpatient Hospital | Payer: No Typology Code available for payment source | Attending: Emergency Medicine | Admitting: Emergency Medicine

## 2021-08-27 ENCOUNTER — Other Ambulatory Visit: Payer: Self-pay

## 2021-08-27 DIAGNOSIS — F4323 Adjustment disorder with mixed anxiety and depressed mood: Secondary | ICD-10-CM | POA: Diagnosis not present

## 2021-08-27 DIAGNOSIS — R45851 Suicidal ideations: Secondary | ICD-10-CM | POA: Insufficient documentation

## 2021-08-27 DIAGNOSIS — Z91040 Latex allergy status: Secondary | ICD-10-CM | POA: Insufficient documentation

## 2021-08-27 DIAGNOSIS — Z046 Encounter for general psychiatric examination, requested by authority: Secondary | ICD-10-CM | POA: Diagnosis present

## 2021-08-27 NOTE — ED Provider Notes (Signed)
I was notified by nursing staff that the patient had eloped or disappeared from his bed.  IVC paperwork has been pending but not yet filed.  The patient is from a group home, and is here for psychiatric/behavioral disturbance and SI, having been discharged to his group home within the past 24 hours after a prolonged stay in [place] ED.   ? ?IVC papers are filed, nursing instructed to contact [place] PD to search for patient if he has left premises. ?  ?Terald Sleeper, MD ?08/27/21 1423 ? ?

## 2021-08-27 NOTE — ED Notes (Signed)
Went to round on pt, pt not on stretcher. MD and [employer] made aware of same. ?

## 2021-08-27 NOTE — ED Provider Notes (Signed)
?[facility] EMERGENCY DEPARTMENT ?Provider Note ? ? ?CSN: [id number] ?Arrival date & time: 08/27/21  1829 ? ?  ? ?History ? ?No chief complaint on file. ? ? ?Louis Turner is a 36 y.o. male. ? ?HPI ? ?This patient returns to the emergency department after eloping several hours ago.  He has been attempting suicide by ingestion and potential overdose, he had actually arrived from the behavioral health urgent care for medical clearance.  While in the emergency department he eloped.  He presents back in police custody with full involuntary commitment orders.  Patient is reluctant to talk to me, he states that he went downtown, this is where the police found him, he turned himself at [employer], he was trying to find a way to get back up the patient states that he is supposed to check in with the [place] [facility] in [place] because of a history of being incarcerated in a federal prison because of manslaughter ? ?Home Medications ?Prior to Admission medications   ?Medication Sig Start Date End Date Taking? Authorizing Provider  ?atorvastatin (LIPITOR) 20 MG tablet Take 20 mg by mouth at bedtime. 07/28/21   [provider]  ?benztropine (COGENTIN) 1 MG tablet Take 1 mg by mouth 2 (two) times daily. 07/28/21   [provider]  ?Cholecalciferol (VITAMIN D3) 50 MCG (2000 UT) capsule Take 2,000 Units by mouth every morning.    [provider]  ?clonazePAM (KLONOPIN) 1 MG tablet Take 1 mg by mouth 2 (two) times daily. 07/28/21   [provider]  ?divalproex (DEPAKOTE ER) 500 MG 24 hr tablet Take 500-1,000 mg by mouth See admin instructions. Take one tablet (500 mg) by mouth every morning and two tablets (1000 mg) at bedtime 07/28/21   [provider]  ?docusate sodium (COLACE) 100 MG capsule Take 100 mg by mouth daily as needed (constipation).    [provider]  ?hydrOXYzine (ATARAX) 25 MG tablet Take 1 tablet (25 mg total) by mouth 3 (three) times daily as needed  for anxiety. ?Patient not taking: Reported on 08/24/2021 08/03/21   Karsten Ro, MD  ?hydrOXYzine (ATARAX) 50 MG tablet Take 100 mg by mouth at bedtime. 07/28/21   [provider]  ?Melatonin 10 MG TABS Take 10 mg by mouth at bedtime.    [provider]  ?omeprazole (PRILOSEC) 40 MG capsule Take 40 mg by mouth every morning. 07/28/21   [provider]  ?QUEtiapine (SEROQUEL) 300 MG tablet Take 300-600 mg by mouth See admin instructions. Take one tablet (300 mg) by mouth every morning and two tablets (600 mg) at bedtime 07/29/21   [provider]  ?traZODone (DESYREL) 50 MG tablet Take 50 mg by mouth at bedtime. 07/28/21   [provider]  ?   ? ?Allergies    ?Latex   ? ?Review of Systems   ?Review of Systems  ?Unable to perform ROS: Psychiatric disorder  ? ?Physical Exam ?Updated Vital Signs ?BP 134/86   Pulse 87   Temp 98.2 ?F (36.8 ?C)   Resp 16   SpO2 97%  ?Physical Exam ?Vitals and nursing note reviewed.  ?Constitutional:   ?   General: He is not in acute distress. ?   Appearance: He is well-developed.  ?HENT:  ?   Head: Normocephalic and atraumatic.  ?   Mouth/Throat:  ?   Pharynx: No oropharyngeal exudate.  ?Eyes:  ?   General: No scleral icterus.    ?   Right eye: No  discharge.     ?   Left eye: No discharge.  ?   Conjunctiva/sclera: Conjunctivae normal.  ?   Pupils: Pupils are equal, round, and reactive to light.  ?Neck:  ?   Thyroid: No thyromegaly.  ?   Vascular: No JVD.  ?Cardiovascular:  ?   Rate and Rhythm: Normal rate and regular rhythm.  ?   Heart sounds: Normal heart sounds. No murmur heard. ?  No friction rub. No gallop.  ?Pulmonary:  ?   Effort: Pulmonary effort is normal. No respiratory distress.  ?   Breath sounds: Normal breath sounds. No wheezing or rales.  ?Abdominal:  ?   General: Bowel sounds are normal. There is no distension.  ?   Palpations: Abdomen is soft. There is no mass.  ?   Tenderness: There is no abdominal tenderness.  ?Musculoskeletal:      ?   General: No tenderness. Normal range of motion.  ?   Cervical back: Normal range of motion and neck supple.  ?Lymphadenopathy:  ?   Cervical: No cervical adenopathy.  ?Skin: ?   General: Skin is warm and dry.  ?   Findings: No erythema or rash.  ?Neurological:  ?   General: No focal deficit present.  ?   Mental Status: He is alert.  ?   Coordination: Coordination normal.  ?Psychiatric:  ?   Comments: The patient has a flat and bizarre affect, not wanting to talk to me very much  ? ? ?ED Results / Procedures / Treatments   ?Labs ?(all labs ordered are listed, but only abnormal results are displayed) ?Labs Reviewed - No data to display ? ?EKG ?None ? ?Radiology ?DG Abdomen Acute W/Chest ? ?Result Date: 08/26/2021 ?CLINICAL DATA:  Possible foreign body ingestion. EXAM: DG ABDOMEN ACUTE WITH 1 VIEW CHEST COMPARISON:  Abdominal x-ray 08/01/2021. FINDINGS: No foreign body identified. There is no evidence of dilated bowel loops or free intraperitoneal air. There is a large amount of stool throughout the colon. No suspicious radiopaque calculi or other significant radiographic abnormality is seen. Phleboliths are noted in the pelvis. Heart size and mediastinal contours are within normal limits. Both lungs are clear. IMPRESSION: Negative abdominal radiographs.  No acute cardiopulmonary disease. Electronically Signed   By: Darliss Cheney M.D.   On: 08/26/2021 22:44   ? ?Procedures ?Procedures  ? ? ?Medications Ordered in ED ?Medications  ?clonazePAM (KLONOPIN) tablet 1 mg (1 mg Oral Patient Refused/Not Given 08/28/21 0246)  ?benztropine (COGENTIN) tablet 1 mg (1 mg Oral Patient Refused/Not Given 08/28/21 0246)  ?atorvastatin (LIPITOR) tablet 20 mg (has no administration in time range)  ?hydrOXYzine (ATARAX) tablet 100 mg (has no administration in time range)  ?melatonin tablet 10 mg (has no administration in time range)  ?pantoprazole (PROTONIX) EC tablet 40 mg (has no administration in time range)  ?traZODone (DESYREL)  tablet 50 mg (has no administration in time range)  ?QUEtiapine (SEROQUEL) tablet 300 mg (has no administration in time range)  ?divalproex (DEPAKOTE ER) 24 hr tablet 500 mg (has no administration in time range)  ?QUEtiapine (SEROQUEL) tablet 600 mg (has no administration in time range)  ?divalproex (DEPAKOTE ER) 24 hr tablet 1,000 mg (has no administration in time range)  ?ziprasidone (GEODON) injection 20 mg (20 mg Intramuscular Given 08/28/21 2035)  ? ? ?ED Course/ Medical Decision Making/ A&P ?  ?                        ?  Medical Decision Making ? ?Due to the patient leaving, we will rule out potential overdose with medications, will obtain labs, repeat psychiatric consult, he will need placement.   ? ?The patient has been placed in observation in the emergency department, awaiting psychiatric evaluation and consultation ? ? ? ? ? ? ? ? ? ?Final Clinical Impression(s) / ED Diagnoses ?Final diagnoses:  ?Suicidal thoughts  ? ? ?Rx / DC Orders ?ED Discharge Orders   ? ? None  ? ?  ? ? ?  ?Eber HongMiller, Suzane Vanderweide, MD ?08/28/21 1451 ? ?

## 2021-08-27 NOTE — Consult Note (Signed)
Reached out to [employer], RN caring for pt to complete psychiatry reassessment via TTS.  Was told, she's unable to locate the patient and she thinks he may have left the building.  ?

## 2021-08-27 NOTE — ED Notes (Signed)
1 bag inventoried and placed in belongings bag. Bag placed and labeled in locker 14 purple zone. ?

## 2021-08-27 NOTE — ED Notes (Signed)
Guardian Ilona Sorrel called and made aware to fill out a missing person report. ?

## 2021-08-27 NOTE — ED Notes (Signed)
Patient continues to get up from stretcher and try and leave the department. Patient is verbally redirectable at this time but states that he knows he is voluntary and will leave soon. Provider made aware of same.  ?

## 2021-08-27 NOTE — ED Notes (Signed)
Pts valuables placed in security envelope and taken to security ? ?

## 2021-08-27 NOTE — ED Triage Notes (Signed)
Pt brought back to ED by [employer] after he ran out of the ED earlier today. Pt now under full IVC.  ?

## 2021-08-28 MED ORDER — HYDROXYZINE HCL 50 MG PO TABS
100.0000 mg | ORAL_TABLET | Freq: Every day | ORAL | Status: DC
  Administered 2021-08-28 – 2021-08-30 (×3): 100 mg via ORAL
  Filled 2021-08-28 (×3): qty 2

## 2021-08-28 MED ORDER — DIVALPROEX SODIUM ER 500 MG PO TB24
500.0000 mg | ORAL_TABLET | Freq: Every day | ORAL | Status: DC
  Administered 2021-08-28 – 2021-08-31 (×4): 500 mg via ORAL
  Filled 2021-08-28 (×4): qty 1

## 2021-08-28 MED ORDER — BENZTROPINE MESYLATE 1 MG PO TABS
1.0000 mg | ORAL_TABLET | Freq: Two times a day (BID) | ORAL | Status: DC
  Administered 2021-08-28 – 2021-08-31 (×7): 1 mg via ORAL
  Filled 2021-08-28 (×9): qty 1

## 2021-08-28 MED ORDER — MELATONIN 5 MG PO TABS
10.0000 mg | ORAL_TABLET | Freq: Every day | ORAL | Status: DC
  Administered 2021-08-28 – 2021-08-30 (×3): 10 mg via ORAL
  Filled 2021-08-28 (×3): qty 2

## 2021-08-28 MED ORDER — QUETIAPINE FUMARATE 200 MG PO TABS
600.0000 mg | ORAL_TABLET | Freq: Every day | ORAL | Status: DC
  Administered 2021-08-28 – 2021-08-30 (×3): 600 mg via ORAL
  Filled 2021-08-28 (×3): qty 3
  Filled 2021-08-28: qty 2

## 2021-08-28 MED ORDER — ZIPRASIDONE MESYLATE 20 MG IM SOLR
20.0000 mg | Freq: Once | INTRAMUSCULAR | Status: AC
  Administered 2021-08-28: 20 mg via INTRAMUSCULAR
  Filled 2021-08-28: qty 20

## 2021-08-28 MED ORDER — PANTOPRAZOLE SODIUM 40 MG PO TBEC
40.0000 mg | DELAYED_RELEASE_TABLET | Freq: Every day | ORAL | Status: DC
  Administered 2021-08-28 – 2021-08-31 (×4): 40 mg via ORAL
  Filled 2021-08-28 (×4): qty 1

## 2021-08-28 MED ORDER — CLONAZEPAM 0.5 MG PO TABS
1.0000 mg | ORAL_TABLET | Freq: Two times a day (BID) | ORAL | Status: DC
Start: 2021-08-28 — End: 2021-08-31
  Administered 2021-08-28 – 2021-08-31 (×7): 1 mg via ORAL
  Filled 2021-08-28 (×8): qty 2

## 2021-08-28 MED ORDER — DIVALPROEX SODIUM ER 500 MG PO TB24
1000.0000 mg | ORAL_TABLET | Freq: Every day | ORAL | Status: DC
  Administered 2021-08-28 – 2021-08-30 (×3): 1000 mg via ORAL
  Filled 2021-08-28 (×4): qty 2

## 2021-08-28 MED ORDER — QUETIAPINE FUMARATE 200 MG PO TABS
300.0000 mg | ORAL_TABLET | Freq: Every day | ORAL | Status: DC
  Administered 2021-08-28 – 2021-08-31 (×4): 300 mg via ORAL
  Filled 2021-08-28 (×3): qty 1

## 2021-08-28 MED ORDER — ATORVASTATIN CALCIUM 10 MG PO TABS
20.0000 mg | ORAL_TABLET | Freq: Every day | ORAL | Status: DC
Start: 2021-08-28 — End: 2021-08-31
  Administered 2021-08-28 – 2021-08-30 (×3): 20 mg via ORAL
  Filled 2021-08-28 (×3): qty 2

## 2021-08-28 MED ORDER — TRAZODONE HCL 50 MG PO TABS
50.0000 mg | ORAL_TABLET | Freq: Every day | ORAL | Status: DC
Start: 2021-08-28 — End: 2021-08-31
  Administered 2021-08-28 – 2021-08-30 (×3): 50 mg via ORAL
  Filled 2021-08-28 (×3): qty 1

## 2021-08-28 NOTE — BH Assessment (Signed)
Needs to be seen later this date. This writer attempted to assess at 1200 hours this date and patient could not be aroused.   ? ?

## 2021-08-28 NOTE — ED Notes (Signed)
Pt resting comfortably, respirations even and unlabored.

## 2021-08-28 NOTE — ED Notes (Signed)
Pt updated on plan of care. Pt agitated at this time, stating that he is going to just walk out. Pt informed he is not allowed to leave. Pt adamant that he is going to leave. MD made aware.  ?

## 2021-08-28 NOTE — ED Provider Notes (Signed)
I was contacted by patient's RN who reports that patient is having behavioral outbursts.  Multiple times at redirection were attempted and unsuccessful.  Patient is here under IVC.  Last EKG was reviewed shows no QTc prolongation.  Order for Geodon was placed. ?  ?Haskel Schroeder, PA-C ?08/28/21 6160 ? ?  ?Ernie Avena, MD ?08/28/21 1117 ? ?

## 2021-08-28 NOTE — ED Notes (Addendum)
Patient ambulatory to bed 49, calm and cooperative. Sitter at bedside ?

## 2021-08-28 NOTE — ED Notes (Signed)
TTS in process 

## 2021-08-28 NOTE — ED Notes (Signed)
Pt told this RN "I promise I am going to hurt myself today, I promise". Security remains at the bedside along with sitter.  ?

## 2021-08-28 NOTE — BH Assessment (Signed)
Clinician messaged Nita Sickle, RN: "Hey. It's Trey with TTS. Is the pt able to engage in the assessment, if so the pt will need to be placed in a private room. Also is the pt under IVC?"  ? ? ?Clinician awaiting response.  ? ? ?Redmond Pulling, MS, [facility] ?Triage Specialist ?[phone] ? ?

## 2021-08-29 DIAGNOSIS — R45851 Suicidal ideations: Secondary | ICD-10-CM

## 2021-08-29 NOTE — ED Notes (Signed)
-  Sitter introduce self to patient  ?-Performed a sweep in patient room  ?-No objects of harm found at this time ?-Explain to patient that sitter is here if patient is in need of anything ?-Patient express understanding and sitter will continue to monitor patient  ?

## 2021-08-29 NOTE — Progress Notes (Signed)
CSW faxed this patient to [facility] at the providers request to seek placement.  ? ?Crissie Reese, MSW, LCSW-A, LCAS-A ?Phone: [phone] ?Disposition/TOC ? ?

## 2021-08-29 NOTE — ED Notes (Signed)
Patient continues to express suicidal ideation ?

## 2021-08-29 NOTE — BH Assessment (Signed)
Comprehensive Clinical Assessment (CCA) Screening, Triage and Referral Note ? ?08/29/2021 ?Louis Turner ?[id number] ? ?Disposition: Cecilio Asper, NP recommends inpatient treatment. AC to review, if no beds available CSW to seek placement. Disposition discussed with Louis Sickle, RN.  ? ?Flowsheet Row ED from 08/27/2021 in [facility] EMERGENCY DEPARTMENT ED from 08/26/2021 in [facility] EMERGENCY DEPARTMENT ED from 08/23/2021 in [facility]  ?C-SSRS RISK CATEGORY High Risk High Risk High Risk  ? ?  ? ?The patient demonstrates the following risk factors for suicide: Chronic risk factors for suicide include: psychiatric disorder of Major Depressive Disorder, Recurrent, Severe, with Psychotic Features . Acute risk factors for suicide include:  Pt reports, he attempted suicide at his group home . Protective factors for this patient include:  Pt reports, he overdose on unknown pills, tried to hang himself and tried to jump out the car . Considering these factors, the overall suicide risk at this point appears to be high. Patient is appropriate for outpatient follow up. ? ?Louis Turner is a 36 year old male who presents involuntary and unaccompanied to [facility]. Clinician asked the pt, "what brought you to the hospital?" Pt reports, he took an unknown amount of pills, tried to hang himself by tying a shirt around his neck but was stopped by staff. Pt reports, he also tried jumping out of the car while on the way to the hospital but the door was locked. Pt reports, he's been at his new group home for less than two weeks. Pt reports, he does not know the name of his group home. Pt reports, wanting to hurt others, pt did not elaborate. Pt reports, he hears people arguing with each other when it's quiet, he hears people calling him names and telling him to kill himself. Pt reports, there are knives and guns in the group home. Pt denies, self-injurious  behaviors.  ? ?Pt was IVC'd by EDP. Per EDP note: "Louis Turner upon to the Emergency Department for evaluation of taking an overdose of medications and reported having ingested multiple metallic foreign bodies, He is behaving erratically and appears to be a danger to himself."  ? ?Pt denies, substance use. Pt's UDS is negative. Per chart, "[facility] [phone], September 06, 2021." Pt denies, linked to OPT resources (medication management and/or counseling.) Per chart, pt has inpatient admissions.  ? ?Pt presents drowsy at times but normal speech. Pt's mood was depressed, hopeless. Pt's affect is flat. Pt's insight was fair. Pt's judgement was poor. Pt reports, he can not contract for safe if discharged.  ? ?Diagnosis: Major Depressive Disorder, Recurrent, Severe, with Psychotic Features. ? ?*Clinician attempted to call pt's legal guardian Louis Turner, [phone] to obtain additional information. Clinician was unable to leave a voice message. Clinician also called 24 hour Crisis Line with "[facility]" and left a HIPPA compliant voice message for Louis Kraft. Clinician awaiting call back.* ? ?Chief Complaint: No chief complaint on file. ? ?Visit Diagnosis:  ? ?Patient Reported Information ?How did you hear about [place]? Other (Comment) (Group Home.) ? ?What Is the Reason for Your Visit/Call Today? Per EDP note: "This patient returns to the emergency department after eloping several hours ago. He has been attempting suicide by ingestion and potential overdose, he had actually arrived from the behavioral health urgent care for medical clearance. While in the emergency department he eloped.  He presents back in police custody with full involuntary commitment orders. Patient is reluctant to talk  to me, he states that he went downtown, this is where the police found him, he turned himself at [employer], he was trying to find a way to get back up the patient states that he is supposed to check in  with the [place] [facility] in [place] because of a history of being incarcerated in a federal prison because of manslaughter." ? ?How Long Has This Been Causing You Problems? 1 wk - 1 month ? ?What Do You Feel Would Help You the Most Today? Treatment for Depression or other mood problem; Housing Assistance ? ? ?Have You Recently Had Any Thoughts About Hurting Yourself? Yes ? ?Are You Planning to Commit Suicide/Harm Yourself At This time? Yes ?  ? ?Have you Recently Had Thoughts About Hurting Someone Karolee Ohslse? Yes ? ?Are You Planning to Harm Someone at This Time? No ? ?Explanation: No data recorded ? ?Have You Used Any Alcohol or Drugs in the Past 24 Hours? No ? ?How Long Ago Did You Use Drugs or Alcohol? No data recorded ?What Did You Use and How Much? No data recorded ? ?Do You Currently Have a Therapist/Psychiatrist? Yes ? ?Name of Therapist/Psychiatrist: Per chart, "[facility] [phone], September 06, 2021." ? ? ?Have You Been Recently Discharged From Any Office Practice or Programs? No ? ?Explanation of Discharge From Practice/Program: No data recorded ?  ?CCA Screening Triage Referral Assessment ?Type of Contact: Tele-Assessment ? ?Telemedicine Service Delivery: Telemedicine service delivery: This service was provided via telemedicine using a 2-way, interactive audio and video technology ? ?Is this Initial or Reassessment? Initial Assessment ? ?Date Telepsych consult ordered in [facility]:  08/28/21 ? ?Time Telepsych consult ordered in [facility]:  0209 ? ?[facility] ? ?Provider [facility] ? ? ?Collateral Involvement: Clinician attempted to call pt's legal guardian Louis FinnerBenard Turner, [phone] to obtain additional information. Clinician also called 24 hour Crisis Line with "[facility]" and left a HIPPA compliant voice message for Louis Turner. Clinician awaiting call back. ? ? ?Does Patient Have a [employer] Appointed Legal Guardian? No data recorded ?Name and Contact of  Legal Guardian: No data recorded ?If Minor and Not Living with Parent(s), Who has Custody? No data recorded ?Is CPS involved or ever been involved? Never ? ?Is APS involved or ever been involved? Currently (Per chart an APS report was made on 08/24/2021.) ? ? ?Patient Determined To Be At Risk for Harm To Self or Others Based on Review of Patient Reported Information or Presenting Complaint? Yes, for Self-Harm ? ?Method: No data recorded ?Availability of Means: No data recorded ?Intent: No data recorded ?Notification Required: No data recorded ?Additional Information for Danger to Others Potential: No data recorded ?Additional Comments for Danger to Others Potential: No data recorded ?Are There Guns or Other Weapons in Your Home? No data recorded ?Types of Guns/Weapons: No data recorded ?Are These Weapons Safely Secured?                            No data recorded ?Who Could Verify You Are Able To Have These Secured: No data recorded ?Do You Have any Outstanding Charges, Pending Court Dates, Parole/Probation? No data recorded ?Contacted To Inform of Risk of Harm To Self or Others: No data recorded ? ?Does Patient Present under Involuntary Commitment? Yes ? ?IVC Papers Initial File Date: 08/27/21 ? ? [place] ? ? ?Patient Currently Receiving the Following Services: Not Receiving Services (Pt  denies, receiving services.) ? ? ?Determination of Need: Emergent (2 hours) ? ? ?Options For Referral: Group Home; Medication Management; Outpatient Therapy; Inpatient Hospitalization; Partial Hospitalization ? ? ?Discharge Disposition:  ?  ? ?Redmond Pulling, [facility] ? ? ?Comprehensive Clinical Assessment (CCA) Note ? ?08/29/2021 ?Lionardo Haze ?[id number] ? ?Chief Complaint: No chief complaint on file. ? ?Visit Diagnosis:   ? ? ?CCA Screening, Triage and Referral (STR) ? ?Patient Reported Information ?How did you hear about [place]? Other (Comment) (Group Home.) ? ?What Is the Reason for Your Visit/Call Today?  Per EDP note: "This patient returns to the emergency department after eloping several hours ago. He has been attempting suicide by ingestion and potential overdose, he had actually arrived from the behavioral he

## 2021-08-29 NOTE — ED Notes (Signed)
Patient out to make phone call. ?

## 2021-08-29 NOTE — ED Notes (Signed)
Patient on TTS 

## 2021-08-29 NOTE — Progress Notes (Signed)
CSW was contacted by [place] in reference to this patient. It was reported that the patient is under review at [facility] and she will reach out to the nurse for further information. CSW provided contact number for the nurse. ? ?Glennie Isle, MSW, LCSW-A, LCAS-A ?Phone: [phone] ?Disposition/TOC ? ?

## 2021-08-29 NOTE — Progress Notes (Signed)
Per Ene Ajibola,NP, patient meets criteria for inpatient treatment. There are no available or appropriate beds at [facility] today. CSW faxed referrals to the following facilities for review: ? ?[facility]  Pending - Request Sent N/A [address] [place] [address] [phone] --  ?[facility]  Pending - Request Sent N/A [address] [place] [address] [phone] --  ?[facility]  Pending - Request Sent N/A [address] [place] [address] [phone] --  ?[facility]  Pending - Request Sent N/A [address] [place] [address] [phone] --  ?[facility]  Pending - Request Sent N/A [address], [place] [address] [phone] --  ?[facility]  Pending - Request Sent N/A [address], [place] [address] [phone] --  ?[facility]  Pending - Request Sent N/A [address] [place] [address] [phone] --  ?[facility]  Pending - Request Sent N/A [address] [place] [address] [phone] --  ?[facility]  Pending - Request Sent N/A [address] [place] [address] [phone] --  ?[facility]  Pending - Request Sent N/A [address] [place] [address] [phone] --  ?[facility]  Pending - Request Sent N/A [address], [place] [address] [phone] --  ?[facility]  Pending - Request Sent N/A [address], [place] [address] [phone] --  ?[facility]  Pending - Request Sent N/A [address] [place] [address] [phone] --  ?[facility]  Pending - Request Sent  N/A [address] [place]  [address] [phone] --  ?[facility]  Pending - Request Sent N/A [address], [place]  [address] [phone] --  ?[facility]  Pending - Request Sent N/A [address], [place]  [address] [phone] --  ? ? ?TTS will continue to seek bed placement. ? ?Christyne Mccain, MSW, LCSW-A, LCAS-A ?Phone: [phone] ?Disposition/TOC ? ?

## 2021-08-29 NOTE — Consult Note (Signed)
Telepsych Consultation  ? ?Reason for Consult:  Psychiatric Reassessment  ?Referring Physician:  Dr. Gerlene Fee ?Location of Patient:   [facility] ?Location of Provider: Other: [facility] ? ?Patient Identification: Louis Turner ?MRN:  [id number] ?Principal Diagnosis: Suicidal ideations ?Diagnosis:  Principal Problem: ?  Suicidal ideations ? ? ?Total Time spent with patient: 30 minutes ? ?Subjective:   ?Louis Turner is a 36 y.o. male patient with hx of IDD,(could not locate clinical documentation to substantiate) admitted for suicidal ideations.  Today when asked how he's doing patient states, "I'm not doing that good. I just don't want live anymore." ? ?HPI:   ?Patient seen via telepsych by this provider; chart reviewed and consulted with Dr. Serafina Mitchell on 08/29/21.  On evaluation Louis Turner who is laying in bed, watching television. He is appropriately groomed in hospital scrubs, there are no apparent ADL deficits.  He is alert and oriented x4. He appears calm, and relaxed, no apparent signs of distress and does not appear to be responding to internal stimulus but continues to report command hallucinations.  He reports he will run into traffic and get hit by a car if discharged today.  States he hears spanish voices" although pt reports he does not speak spanish, he knows they are telling him to kill himself.  When asked what makes him want to end his life, he states, "I'm just tired."  He denies provocative or inciting factors.  When asked if his group home is a trigger, he denies this but also states, "they are [place] get what they have coming to them. A 60 day notice." Pt does not elaborate on what this means.  When asked about friend or relatives he offers child like responses that collaborate his developmental age does not line up with his chronological age. "I'm gonna get a greyhound ticket to find my mother." When asked if he has an address or means to locate her he states "no, and I  haven't spoken with her in a long time, months even." ? ?Apparently patient presented to the ED on 3/17 for SI and was agitated.  He left on 3/18 before he could be assessed by psychiatry.  Historically patient reports he was not aware he needed to be assessed.  Within the past 24 hours, he has cooperative and has not require prn medications for agitation.     ? ?Per [facility] Assessment 08/29/2021 ?Louis Turner is a 36 year old male who presents involuntary and unaccompanied to [facility]. Clinician asked the pt, "what brought you to the hospital?" Pt reports, he took an unknown amount of pills, tried to hang himself by tying a shirt around his neck but was stopped by staff. Pt reports, he also tried jumping out of the car while on the way to the hospital but the door was locked. Pt reports, he's been at his new group home for less than two weeks. Pt reports, he does not know the name of his group home. Pt reports, wanting to hurt others, pt did not elaborate. Pt reports, he hears people arguing with each other when it's quiet, he hears people calling him names and telling him to kill himself. Pt reports, there are knives and guns in the group home. Pt denies, self-injurious behaviors.  ?  ?Pt was IVC'd by EDP. Per EDP note: "Louis Turner upon to the Emergency Department for evaluation of taking an overdose of medications and reported having ingested multiple metallic foreign bodies, He is behaving erratically and appears  to be a danger to himself."  ?  ?Pt denies, substance use. Pt's UDS is negative. Per chart, "Louis Turner, September 06, 2021." Pt denies, linked to OPT resources (medication management and/or counseling.) Per chart, pt has inpatient admissions.  ?  ?Pt presents drowsy at times but normal speech. Pt's mood was depressed, hopeless. Pt's affect is flat. Pt's insight was fair. Pt's judgement was poor. Pt reports, he can not contract for safe if discharged. ?Per Ed Provider Admission  Assessment 08/27/2021: ?Louis Turner is a 36 y.o. male. ?  ?HPI ?  ?This patient returns to the emergency department after eloping several hours ago.  He has been attempting suicide by ingestion and potential overdose, he had actually arrived from the behavioral health urgent care for medical clearance.  While in the emergency department he eloped.  He presents back in police custody with full involuntary commitment orders.  Patient is reluctant to talk to me, he states that he went downtown, this is where the police found him, he turned himself at [employer], he was trying to find a way to get back up the patient states that he is supposed to check in with the [place] [facility] in [place] because of a history of being incarcerated in a federal prison because of manslaughter ?  ? ?Past Psychiatric History: as outlined above ? ?Risk to Self:  yes ?Risk to Others:  yes ?Prior Inpatient Therapy: unknown ?Prior Outpatient Therapy:  yes ? ?Past Medical History: No past medical history on file. No past surgical history on file. ?Family History: No family history on file. ?Family Psychiatric  History: unknown ?Social History:  ?Social History  ? ?Substance and Sexual Activity  ?Alcohol Use None  ?   ?Social History  ? ?Substance and Sexual Activity  ?Drug Use Not on file  ?  ?Social History  ? ?Socioeconomic History  ? Marital status: Single  ?  Spouse name: Not on file  ? Number of children: Not on file  ? Years of education: Not on file  ? Highest education level: Not on file  ?Occupational History  ? Not on file  ?Tobacco Use  ? Smoking status: Unknown  ? Smokeless tobacco: Not on file  ?Substance and Sexual Activity  ? Alcohol use: Not on file  ? Drug use: Not on file  ? Sexual activity: Not on file  ?Other Topics Concern  ? Not on file  ?Social History Narrative  ? Not on file  ? ?Social Determinants of Health  ? ?Financial Resource Strain: Not on file  ?Food Insecurity: Not on file  ?Transportation Needs: Not on file   ?Physical Activity: Not on file  ?Stress: Not on file  ?Social Connections: Not on file  ? ?Additional Social History: ?  ? ?Allergies:   ?Allergies  ?Allergen Reactions  ? Latex Rash  ? ? ?Labs: No results found for this or any previous visit (from the past 48 hour(s)). ? ?Medications:  ?Current Facility-Administered Medications  ?Medication Dose Route Frequency Provider Last Rate Last Admin  ? atorvastatin (LIPITOR) tablet 20 mg  20 mg Oral QHS Maudie Flakes, MD   20 mg at 08/28/21 2224  ? benztropine (COGENTIN) tablet 1 mg  1 mg Oral BID Maudie Flakes, MD   1 mg at 08/29/21 [id number]  ? clonazePAM (KLONOPIN) tablet 1 mg  1 mg Oral BID Maudie Flakes, MD   1 mg at 08/29/21 [id number]  ? divalproex (DEPAKOTE ER) 24 hr tablet 1,000  mg  1,000 mg Oral QHS Maudie Flakes, MD   1,000 mg at 08/28/21 2224  ? divalproex (DEPAKOTE ER) 24 hr tablet 500 mg  500 mg Oral Daily Maudie Flakes, MD   500 mg at 08/29/21 [id number]  ? hydrOXYzine (ATARAX) tablet 100 mg  100 mg Oral QHS Maudie Flakes, MD   100 mg at 08/28/21 2224  ? melatonin tablet 10 mg  10 mg Oral QHS Maudie Flakes, MD   10 mg at 08/28/21 2224  ? pantoprazole (PROTONIX) EC tablet 40 mg  40 mg Oral Daily Maudie Flakes, MD   40 mg at 08/29/21 0947  ? QUEtiapine (SEROQUEL) tablet 300 mg  300 mg Oral Daily Maudie Flakes, MD   300 mg at 08/29/21 [id number]  ? QUEtiapine (SEROQUEL) tablet 600 mg  600 mg Oral QHS Maudie Flakes, MD   600 mg at 08/28/21 2224  ? traZODone (DESYREL) tablet 50 mg  50 mg Oral QHS Maudie Flakes, MD   50 mg at 08/28/21 2224  ? ?Current Outpatient Medications  ?Medication Sig Dispense Refill  ? atorvastatin (LIPITOR) 20 MG tablet Take 20 mg by mouth at bedtime.    ? benztropine (COGENTIN) 1 MG tablet Take 1 mg by mouth 2 (two) times daily.    ? Cholecalciferol (VITAMIN D3) 50 MCG (2000 UT) capsule Take 2,000 Units by mouth every morning.    ? clonazePAM (KLONOPIN) 1 MG tablet Take 1 mg by mouth 2 (two) times daily.    ? divalproex (DEPAKOTE ER) 500  MG 24 hr tablet Take 500-1,000 mg by mouth See admin instructions. Take one tablet (500 mg) by mouth every morning and two tablets (1000 mg) at bedtime    ? docusate sodium (COLACE) 100 MG capsule Take 100 mg by

## 2021-08-29 NOTE — ED Notes (Signed)
Repeat HR 88 via dinamap, 90 palp at right radial ?

## 2021-08-30 ENCOUNTER — Encounter (HOSPITAL_COMMUNITY): Payer: Self-pay | Admitting: Registered Nurse

## 2021-08-30 DIAGNOSIS — R45851 Suicidal ideations: Secondary | ICD-10-CM | POA: Insufficient documentation

## 2021-08-30 DIAGNOSIS — F4323 Adjustment disorder with mixed anxiety and depressed mood: Secondary | ICD-10-CM

## 2021-08-30 NOTE — ED Notes (Addendum)
Pt asked Agricultural consultant, "what is the next step?". Writer and RN have repeatedly told pt that he is currently on hold but we are not entirely sure as to where he is going yet. Pt asked "will I be staying here for days?". Writer told pt "maybe, we just aren't too sure buddy". Pt stating "well F*CK! F*CK! These people are racists!". ? ?Pt now requesting to leave, pt attempted to leave but officer redirected pt. RN has been updated ?

## 2021-08-30 NOTE — ED Notes (Signed)
Placed breakfast order ?

## 2021-08-30 NOTE — ED Notes (Signed)
Pt requested to take a shower, supplies given. Pt was made aware that all of shower supplies must be given back after shower.  ?

## 2021-08-30 NOTE — ED Notes (Addendum)
0758:Pt's breakfast has arrived, spoon retrieved from tray BEFORE given to pt. RN has been updated about pt having an only finger food tray, will change once lunch comes around.  ?Pt's tray had: 1 tray, 1 milk, 1 cranberry juice, 1 small tray of creamers/sugars and 1 small side tray of grits.  ?5852: Pt's tray has been taken. Pt finished all of breakfast. Writer even took the 2 cups and apple sauce from earlier. Pt requested to sleep some more.  ?

## 2021-08-30 NOTE — ED Notes (Signed)
Pt talking to TTS, pt refusing to open his eyes but willing to answer questions ?

## 2021-08-30 NOTE — Consult Note (Signed)
Telepsych Consultation  ? ?Reason for Consult:  Psychiatric Reassessment  ?Referring Physician:  Dr. Kennis Carina ?Location of Patient:   [name] ?Location of Provider: Other: [facility] ? ?Patient Identification: Louis Turner ?MRN:  [id number] ?Principal Diagnosis: Adjustment disorder with mixed anxiety and depressed mood ?Diagnosis:  Principal Problem: ?  Adjustment disorder with mixed anxiety and depressed mood ?Active Problems: ?  Suicidal ideations ? ? ?Total Time spent with patient: 30 minutes ? ?Subjective:   ?Louis Turner is a 36 y.o. male patient with hx of IDD,(could not locate clinical documentation to substantiate) admitted for suicidal ideations.  Today when asked how he's doing patient states, "I'm not doing that good. I just don't want live anymore." ? ?HPI:  Louis Turner, 35 y.o., male patient seen via tele health by this provider, consulted with Dr. Nelly Rout; and chart reviewed on 08/30/21.  On evaluation Jovonta Levit reports he continues to be suicidal and unable to contract for safety. Patient laying in bed with eyes closed will open occasionally.  He states he continues to hear voices telling him to kill himself.  He is unable to contract for safety.  Will continue to seek inpatient psychiatric treatment.  ?story of being incarcerated in a federal prison because of manslaughter ?  ? ?Past Psychiatric History: as outlined above ? ?Risk to Self:  yes ?Risk to Others:  yes ?Prior Inpatient Therapy: unknown ?Prior Outpatient Therapy:  yes ? ?Past Medical History: History reviewed. No pertinent past medical history. History reviewed. No pertinent surgical history. ?Family History: History reviewed. No pertinent family history. ?Family Psychiatric  History: unknown ?Social History:  ?Social History  ? ?Substance and Sexual Activity  ?Alcohol Use None  ?   ?Social History  ? ?Substance and Sexual Activity  ?Drug Use Not on file  ?  ?Social History  ? ?Socioeconomic History  ?  Marital status: Single  ?  Spouse name: Not on file  ? Number of children: Not on file  ? Years of education: Not on file  ? Highest education level: Not on file  ?Occupational History  ? Not on file  ?Tobacco Use  ? Smoking status: Unknown  ? Smokeless tobacco: Not on file  ?Substance and Sexual Activity  ? Alcohol use: Not on file  ? Drug use: Not on file  ? Sexual activity: Not on file  ?Other Topics Concern  ? Not on file  ?Social History Narrative  ? Not on file  ? ?Social Determinants of Health  ? ?Financial Resource Strain: Not on file  ?Food Insecurity: Not on file  ?Transportation Needs: Not on file  ?Physical Activity: Not on file  ?Stress: Not on file  ?Social Connections: Not on file  ? ?Additional Social History: ?  ? ?Allergies:   ?Allergies  ?Allergen Reactions  ? Latex Rash  ? ? ?Labs: No results found for this or any previous visit (from the past 48 hour(s)). ? ?Medications:  ?Current Facility-Administered Medications  ?Medication Dose Route Frequency Provider Last Rate Last Admin  ? atorvastatin (LIPITOR) tablet 20 mg  20 mg Oral QHS Sabas Sous, MD   20 mg at 08/29/21 2149  ? benztropine (COGENTIN) tablet 1 mg  1 mg Oral BID Sabas Sous, MD   1 mg at 08/30/21 1041  ? clonazePAM (KLONOPIN) tablet 1 mg  1 mg Oral BID Sabas Sous, MD   1 mg at 08/30/21 1040  ? divalproex (DEPAKOTE ER) 24 hr tablet 1,000 mg  1,000 mg Oral QHS Sabas Sous, MD   1,000 mg at 08/29/21 2149  ? divalproex (DEPAKOTE ER) 24 hr tablet 500 mg  500 mg Oral Daily Sabas Sous, MD   500 mg at 08/30/21 1047  ? hydrOXYzine (ATARAX) tablet 100 mg  100 mg Oral QHS Sabas Sous, MD   100 mg at 08/29/21 2149  ? melatonin tablet 10 mg  10 mg Oral QHS Sabas Sous, MD   10 mg at 08/29/21 2149  ? pantoprazole (PROTONIX) EC tablet 40 mg  40 mg Oral Daily Sabas Sous, MD   40 mg at 08/30/21 1044  ? QUEtiapine (SEROQUEL) tablet 300 mg  300 mg Oral Daily Sabas Sous, MD   300 mg at 08/30/21 1042  ? QUEtiapine  (SEROQUEL) tablet 600 mg  600 mg Oral QHS Sabas Sous, MD   600 mg at 08/29/21 2148  ? traZODone (DESYREL) tablet 50 mg  50 mg Oral QHS Sabas Sous, MD   50 mg at 08/29/21 2149  ? ?Current Outpatient Medications  ?Medication Sig Dispense Refill  ? atorvastatin (LIPITOR) 20 MG tablet Take 20 mg by mouth at bedtime.    ? benztropine (COGENTIN) 1 MG tablet Take 1 mg by mouth 2 (two) times daily.    ? Cholecalciferol (VITAMIN D3) 50 MCG (2000 UT) capsule Take 2,000 Units by mouth every morning.    ? clonazePAM (KLONOPIN) 1 MG tablet Take 1 mg by mouth 2 (two) times daily.    ? divalproex (DEPAKOTE ER) 500 MG 24 hr tablet Take 500-1,000 mg by mouth See admin instructions. Take one tablet (500 mg) by mouth every morning and two tablets (1000 mg) at bedtime    ? docusate sodium (COLACE) 100 MG capsule Take 100 mg by mouth daily as needed (constipation).    ? hydrOXYzine (ATARAX) 25 MG tablet Take 1 tablet (25 mg total) by mouth 3 (three) times daily as needed for anxiety. (Patient not taking: Reported on 08/24/2021) 30 tablet 0  ? hydrOXYzine (ATARAX) 50 MG tablet Take 100 mg by mouth at bedtime.    ? Melatonin 10 MG TABS Take 10 mg by mouth at bedtime.    ? omeprazole (PRILOSEC) 40 MG capsule Take 40 mg by mouth every morning.    ? QUEtiapine (SEROQUEL) 300 MG tablet Take 300-600 mg by mouth See admin instructions. Take one tablet (300 mg) by mouth every morning and two tablets (600 mg) at bedtime    ? traZODone (DESYREL) 50 MG tablet Take 50 mg by mouth at bedtime.    ? ? ?Musculoskeletal: patient seen moving all extremities and ambulates independently, no concerns ?Strength & Muscle Tone: within normal limits ?Gait & Station: normal ?Patient leans: N/A ? ? ?Psychiatric Specialty Exam: ? ?Presentation  ?General Appearance: Appropriate for Environment ? ?Eye Contact:None ? ?Speech:Clear and Coherent ? ?Speech Volume:Decreased ? ?Handedness:Right ? ? ?Mood and Affect  ?Mood:Depressed ? ?Affect:Congruent ? ? ?Thought  Process  ?Thought Processes:Coherent ? ?Descriptions of Associations:Intact ? ?Orientation:Full (Time, Place and Person) ? ?Thought Content:Illogical ? ?History of Schizophrenia/Schizoaffective disorder:No data recorded ?Duration of Psychotic Symptoms:N/A ? ?Hallucinations:Hallucinations: Auditory ?Description of Command Hallucinations: Voices telling him to kill himself ? ?Ideas of Reference:None ? ?Suicidal Thoughts:Suicidal Thoughts: -- (States he is tired of everything) ?SI Active Intent and/or Plan: With Intent; With Plan (Continues to endorse suicidal ideation and unable to contract for safety) ?SI Passive Intent and/or Plan: With Intent; With Plan ? ?Homicidal Thoughts:Homicidal Thoughts: Yes,  Passive (When asked who HI towards "Not for sure") ?HI Passive Intent and/or Plan: Without Plan; Without Intent ? ? ?Sensorium  ?Memory:Immediate Good; Recent Good ? ?Judgment:Impaired ? ?Insight:Lacking ? ? ?Executive Functions  ?Concentration:Fair ? ?Attention Span:Fair ? ?Recall:Good ? ?Fund of Knowledge:Good ? ?Language:Good ? ? ?Psychomotor Activity  ?Psychomotor Activity:Psychomotor Activity: Normal ? ? ?Assets  ?Assets:Communication Skills; Desire for Improvement; Social Support ? ? ?Sleep  ?Sleep:Sleep: Fair ?Number of Hours of Sleep: 6 ? ? ? ?Physical Exam: ?Physical Exam ?Constitutional:   ?   Appearance: Normal appearance.  ?Cardiovascular:  ?   Rate and Rhythm: Normal rate.  ?   Pulses: Normal pulses.  ?Pulmonary:  ?   Effort: Pulmonary effort is normal.  ?Musculoskeletal:  ?   Cervical back: Normal range of motion.  ?Neurological:  ?   General: No focal deficit present.  ?   Mental Status: He is alert and oriented to person, place, and time.  ?Psychiatric:     ?   Attention and Perception: Attention normal. He perceives auditory (endorses command hallucinations but does not appear to be responding to internal stimulus) hallucinations.     ?   Mood and Affect: Mood is depressed. Affect is flat.     ?    Speech: Speech normal.     ?   Behavior: Behavior normal. Behavior is cooperative.     ?   Thought Content: Thought content is not paranoid or delusional. Thought content includes homicidal and suicidal ideati

## 2021-08-30 NOTE — ED Provider Notes (Signed)
Emergency Medicine Observation Re-evaluation Note ? ?Louis Turner is a 36 y.o. male, seen on rounds today.  Pt initially presented to the ED for complaints of No chief complaint on file. ?Currently, the patient is resting in room. ? ?Physical Exam  ?BP 129/75 (BP Location: Right Arm)   Pulse 61   Temp 98.1 ?F (36.7 ?C) (Oral)   Resp 18   SpO2 99%  ?Physical Exam ?General: resting comfortably, NAD ?Lungs: normal WOB ?Psych: currently calm and resting ? ?ED Course / MDM  ?EKG:  ? ?I have reviewed the labs performed to date as well as medications administered while in observation.  Recent changes in the last 24 hours include none. ? ?Plan  ?Current plan is for inpatient placement. ?Louis Turner is under involuntary commitment. ?  ? ?  ?Louis Logan, DO ?08/30/21 0830 ? ?

## 2021-08-30 NOTE — ED Notes (Signed)
-  obtain vitals observe patient chest rise and fall  ?-perform a sweep in patient room no objects of harm found.  ?

## 2021-08-30 NOTE — ED Notes (Signed)
Writer went into the room and introduce self to pt. Pt asleep at the moment, writer did a sweep.  Pt had 2 cups, 1 straw and 1 water bottle in room. Writer was given in report that pt has history of inserting random objects inside of their rectum/mouth. Will ask night shift/day shift RN to switch pt to finger tray food.  ?

## 2021-08-30 NOTE — ED Notes (Addendum)
Pt's dinner has arrived. Writer checked on tray, pt has 1 spoon on tray. Will take everything back once pt is done ? ?1759: Pt's tray has been collected. Pt finished all of this food ?

## 2021-08-30 NOTE — ED Notes (Signed)
Pt resting in bed watching tv, no complaints at this time.

## 2021-08-30 NOTE — Progress Notes (Signed)
Patient has been faxed out per the recommendation of Dr. Lucianne Muss. Patient meets BH inpatient criteria per Cecilio Asper, NP. Patient has been faxed out to the following facilities:  ? ? ?[facility]  [address] [name] [address] [phone]  ?[facility]  [address], [name] [address] [phone]  ?[facility] [address] [name] [address] [phone]  ?[facility]  [address] [name] [address] [phone]  ?[facility]  [address] [place], [name] [address] [phone]  ?[facility]  [address] [place], [name] [address] [phone]  ?[facility]  [address] [name] [address] [phone]  ?[facility]  [address] [name] [address] [phone]  ?[facility]  [address] [name] [address] [phone]  ?[facility]  [address], [name] [address] [phone]  ?[facility]  [address] [name] [address] [phone]  ?[facility]  [address] [name] [address] [phone]  ?[facility]  [address] [place]., [name] [address] [phone]  ?[facility]  [address] [name] [address] [phone]  ?[facility]  [address], [name] [address] [phone]  ?[facility]  [address] [place], [name] [address] [phone]  ?[facility]  [address], [name] [address] [phone]  ? ?Damita Dunnings, MSW, LCSW-A  ?2:06 PM  08/30/2021   ?

## 2021-08-30 NOTE — ED Notes (Addendum)
Pt's lunch has arrived, pt required spoon for his lunch. Writer will retrieve spoon and entire tray once pt is done with lunch. RN aware ? ?1310: Writer took all of the entire try. Pt ate all of his food ?

## 2021-08-31 NOTE — ED Notes (Signed)
Pt given a snack and a drink 

## 2021-08-31 NOTE — ED Notes (Addendum)
[id number]: Pt requested to make a phone call. Pt made aware of phone call rules. They did not answer. ? ?1014: Pt making his first phonecall ?

## 2021-08-31 NOTE — ED Notes (Signed)
Pt requested to take a shower. Shower supplies given to pt ?

## 2021-08-31 NOTE — ED Notes (Signed)
Pt making a phone call, pt stated he had 2 phone calls but due to Psychiatry telling pt he is now not going home(pt was told by psychiatry prior that he was going home)... pt would like to update his mother. Allowed by Probation officer, NT and RN. Mother did not pick up, writer told pt that we can try again ?

## 2021-08-31 NOTE — Progress Notes (Signed)
[facility] LCSW Progress Note ?  ?08/31/2021    12:18 PM ? ?Louis Turner  ? ?[id number]  ? ?Type of Contact and Topic:  Psychiatric Bed Placement  ? ?Pt accepted to [facility]    ? ?Patient meets inpatient criteria per Cecilio Asper, NP ? ?The attending provider will be Estill Cotta, MD ? ?Call report to [phone] or [phone] ? ?Shuronia Pflueger, RN @ [facility] ED notified.    ? ?Pt scheduled  to arrive at [facility] TODAY ANYTIME. Please fax IVC paperwork prior to transporting the Pt to the facility.  ? ?Damita Dunnings, MSW, LCSW-A  ?12:20 PM 08/31/2021   ?  ? ?  ?  ? ? ? ? ?  ?

## 2021-08-31 NOTE — Final Consult Note (Addendum)
Consultant Final Sign-Off Note  ? ? ?Assessment/Final recommendations  ?Louis Turner is a 36 y.o. male followed by me for a psychiatric consult.  Patient this morning is not suicidal, homicidal or psychotic.  Patient states that he can return to live back with mom, has a guardian, reports he has outpatient services ? ?Patient is psych cleared, does not need inpatient psychiatric admission.   ?Wound care (if applicable):  ?  ?Diet at discharge: per primary team ?  ?Activity at discharge: per primary team ?  ?Follow-up appointment:   ?  ?Pending results:  ?Unresulted Labs (From admission, onward)  ? ? None  ? ?  ? ?  ?Medication recommendations: ?Benztropine 1mg  po BID  ?Clonazepam 1mg  po BID  ?Divalproex 24hr tab 1000mg  q hs ?Divalproex 24 hr tab 500mg  po am ?Melatonin 10mg  po qhs ?Quetiapine 300mg  po daily ?Quetiapine 600mg  po qhs ?Trazodone 50mg  po q hs ?  ?Other recommendations: ?  ? ?Thank you for allowing to participate in the care of your patient!  Please consult again if you have further needs for your patient. ? ? ?08/31/2021 ?12:01 PM ? ? ? ?Subjective  ? ?Patient is a 36 year old male who initially presented to the ED reporting suicidal ideation, along with hearing voices. ? ?Patient accepted to [facility] ? ?Objective  ?Vital signs in last 24 hours: ?Temp:  [98.2 ?F (36.8 ?C)-98.5 ?F (36.9 ?C)] 98.3 ?F (36.8 ?C) (03/21 ) ?Pulse Rate:  [78-88] 78 (03/21 0628) ?Resp:  [16-18] 18 (03/21 Nelly Rout) ?BP: (124-132)/(64-82) 132/82 (03/21 20) ?SpO2:  [97 %-99 %] 99 % (03/21 0628) ? ?General: ?Psychiatric Specialty Exam: ?Physical Exam  ?Review of Systems  ?Constitutional: Negative.   ?HENT: Negative.    ?Eyes: Negative.   ?Respiratory: Negative.    ?Cardiovascular: Negative.   ?Neurological: Negative.  Negative for dizziness, tremors, seizures and light-headedness.  ?Psychiatric/Behavioral:  Negative for agitation, behavioral problems, decreased concentration, dysphoric mood and  hallucinations. The patient is not nervous/anxious and is not hyperactive.    ?Blood pressure 132/82, pulse 78, temperature 98.3 ?F (36.8 ?C), temperature source Oral, resp. rate 18, SpO2 99 %.There is no height or weight on file to calculate BMI.  ? ? ?Pertinent labs and Studies: ?No results for input(s): WBC, HGB, HCT in the last 72 hours. ?BMET ?No results for input(s): NA, K, CL, CO2, GLUCOSE, BUN, CREATININE, CALCIUM in the last 72 hours. ?No results for input(s): LABURIN in the last 72 hours. ?Results for orders placed or performed during the hospital encounter of 08/26/21  ?Resp Panel by RT-PCR (Flu A&B, Covid) Nasopharyngeal Swab     Status: None  ? Collection Time: 08/26/21 10:09 PM  ? Specimen: Nasopharyngeal Swab; Nasopharyngeal(NP) swabs in vial transport medium  ?Result Value Ref Range Status  ? SARS Coronavirus 2 by RT PCR NEGATIVE NEGATIVE Final  ?  Comment: (NOTE) ?SARS-CoV-2 target nucleic acids are NOT DETECTED. ? ?The SARS-CoV-2 RNA is generally detectable in upper respiratory ?specimens during the acute phase of infection. The lowest ?concentration of SARS-CoV-2 viral copies this assay can detect is ?138 copies/mL. A negative result does not preclude SARS-Cov-2 ?infection and should not be used as the sole basis for treatment or ?other patient management decisions. A negative result may occur with  ?improper specimen collection/handling, submission of specimen other ?than nasopharyngeal swab, presence of viral mutation(s) within the ?areas targeted by this assay, and inadequate number of viral ?copies(<138 copies/mL). A negative result must be combined with ?clinical observations, patient history,  and epidemiological ?information. The expected result is Negative. ? ?Fact Sheet for Patients:  ?BloggerCourse.com ? ?Fact Sheet for Healthcare Providers:  ?SeriousBroker.it ? ?This test is no t yet approved or cleared by the [place] FDA and  ?has  been authorized for detection and/or diagnosis of SARS-CoV-2 by ?FDA under an Emergency Use Authorization (EUA). This EUA will remain  ?in effect (meaning this test can be used) for the duration of the ?COVID-19 declaration under Section 564(b)(1) of the Act, 21 ?U.S.C.section 360bbb-3(b)(1), unless the authorization is terminated  ?or revoked sooner.  ? ? ?  ? Influenza A by PCR NEGATIVE NEGATIVE Final  ? Influenza B by PCR NEGATIVE NEGATIVE Final  ?  Comment: (NOTE) ?The Xpert Xpress SARS-CoV-2/FLU/RSV plus assay is intended as an aid ?in the diagnosis of influenza from Nasopharyngeal swab specimens and ?should not be used as a sole basis for treatment. Nasal washings and ?aspirates are unacceptable for Xpert Xpress SARS-CoV-2/FLU/RSV ?testing. ? ?Fact Sheet for Patients: ?BloggerCourse.com ? ?Fact Sheet for Healthcare Providers: ?SeriousBroker.it ? ?This test is not yet approved or cleared by the [place] FDA and ?has been authorized for detection and/or diagnosis of SARS-CoV-2 by ?FDA under an Emergency Use Authorization (EUA). This EUA will remain ?in effect (meaning this test can be used) for the duration of the ?COVID-19 declaration under Section 564(b)(1) of the Act, 21 U.S.C. ?section 360bbb-3(b)(1), unless the authorization is terminated or ?revoked. ? ?Performed at [facility], [address], [place], [name] ?[address] ?  ? ? ?Imaging: ?No results found. ? ? ? ? ? ?

## 2021-08-31 NOTE — Progress Notes (Signed)
Patient has bern faxed out per the request of Dr. Lucianne Muss. Patient meets BH inpatient criteria Cecilio Asper, NP. Patient has been faxed out to the following facilities:  ? ?[facility]  [address] [name] [address] [phone]  ?[facility]  [address], [name] [address] [phone]  ?[facility] [address] [name] [address] [phone]  ?[facility]  [address], [name] [address] [phone]  ?[facility]  [address] [place], [name] [address] [phone]  ?[facility]  [address] [place], [name] [address] [phone]  ?[facility]  [address] [name] [address] [phone]  ?[facility]  [address] [name] [address] [phone]  ?[facility]  [address] [name] [address] [phone]  ?[facility]  [address] [name] [address] [phone]  ?[facility]  [address] [name] [address] [phone]  ?[facility]  [address], [name] [address] [phone]  ?[facility]  [address] [name] [address] [phone]  ?[facility]  [address], [name] [address] [phone]  ?[facility]  [address], [name] [address] [phone]  ?[facility]  [address] [place], [name] [address] [phone]  ?[facility]  [address], [name] [address] [phone]  ? ?Damita Dunnings, MSW, LCSW-A  ?11:27 AM 08/31/2021    ?

## 2021-08-31 NOTE — ED Notes (Signed)
Placed breakfast order ?

## 2021-08-31 NOTE — ED Notes (Signed)
Writer introduced to pt, pt had several items in the room. Writer took out all of the things they can harm themselves with due to history ?

## 2021-08-31 NOTE — ED Notes (Addendum)
Pt's lunch tray has been delivered. Pt has: ?1 spoon ?1 tray ?1 drink ?1 roll ?1 side tray ? ?1311: Writer collected all of the pt's entire tray ?

## 2021-08-31 NOTE — BH Assessment (Signed)
[place] Assessment Progress Note ?  ?Per Hampton Abbot, MD this pt requires psychiatric hospitalization at this time.  At 12:49 I spoke to his guardian, Gypsy Decant with [facility] [phone]) to inform him of this.  My colleague, Mariea Clonts, will be seeking placement.  Please keep guardian informed of final disposition. ? ?Jalene Mullet, MA ?Behavioral Health Coordinator ?[phone]  ?

## 2021-08-31 NOTE — ED Notes (Signed)
Patient states he is still somewhat suicidal but denies homicidal ideation.  ?

## 2021-08-31 NOTE — ED Notes (Addendum)
Pt's breakfast has arrived, Probation officer has made note of everything on tray: ?1 spoon ?1 tay ?1 ?Side tray ?1 milk ?1 orange juice and 1 coffee ? ?0805: Pt finished most of his food. Writer took up everything except for the orange juice.  ?

## 2021-09-11 ENCOUNTER — Emergency Department
Admission: EM | Admit: 2021-09-11 | Discharge: 2021-09-11 | Disposition: A | Discharge status: Home / Self Care | Payer: Medicaid - Out of State | Attending: Emergency Medicine | Admitting: Emergency Medicine

## 2021-09-11 ENCOUNTER — Encounter (HOSPITAL_COMMUNITY): Payer: Self-pay

## 2021-09-11 ENCOUNTER — Other Ambulatory Visit: Payer: Self-pay

## 2021-09-11 ENCOUNTER — Emergency Department (HOSPITAL_COMMUNITY): Payer: Self-pay

## 2021-09-11 DIAGNOSIS — Z760 Encounter for issue of repeat prescription: Secondary | ICD-10-CM | POA: Insufficient documentation

## 2021-09-11 DIAGNOSIS — F32A Depression, unspecified: Secondary | ICD-10-CM

## 2021-09-11 DIAGNOSIS — K219 Gastro-esophageal reflux disease without esophagitis: Secondary | ICD-10-CM | POA: Insufficient documentation

## 2021-09-11 DIAGNOSIS — Z8669 Personal history of other diseases of the nervous system and sense organs: Secondary | ICD-10-CM | POA: Insufficient documentation

## 2021-09-11 DIAGNOSIS — F329 Major depressive disorder, single episode, unspecified: Secondary | ICD-10-CM | POA: Insufficient documentation

## 2021-09-11 DIAGNOSIS — F320 Major depressive disorder, single episode, mild: Secondary | ICD-10-CM

## 2021-09-11 DIAGNOSIS — E785 Hyperlipidemia, unspecified: Secondary | ICD-10-CM | POA: Insufficient documentation

## 2021-09-11 HISTORY — DX: Schizophrenia, unspecified: F20.9

## 2021-09-11 HISTORY — DX: Gastro-esophageal reflux disease without esophagitis: K21.9

## 2021-09-11 HISTORY — DX: Bipolar disorder, unspecified: F31.9

## 2021-09-11 HISTORY — DX: Mixed hyperlipidemia: E78.2

## 2021-09-11 HISTORY — DX: Unspecified convulsions: R56.9

## 2021-09-11 MED ORDER — DIVALPROEX 500 MG TABLET,DELAYED RELEASE
500.0000 mg | DELAYED_RELEASE_TABLET | Freq: Two times a day (BID) | ORAL | 1 refills | Status: DC
Start: 2021-09-11 — End: 2021-10-01

## 2021-09-11 MED ORDER — ATORVASTATIN 10 MG TABLET
20.0000 mg | ORAL_TABLET | ORAL | Status: AC
Start: 2021-09-11 — End: 2021-09-11
  Administered 2021-09-11: 20 mg via ORAL

## 2021-09-11 MED ORDER — PANTOPRAZOLE 40 MG TABLET,DELAYED RELEASE
40.0000 mg | DELAYED_RELEASE_TABLET | ORAL | Status: AC
Start: 2021-09-11 — End: 2021-09-11
  Administered 2021-09-11: 40 mg via ORAL

## 2021-09-11 MED ORDER — ATORVASTATIN 20 MG TABLET
20.0000 mg | ORAL_TABLET | Freq: Every day | ORAL | 1 refills | Status: DC
Start: 2021-09-11 — End: 2021-10-01

## 2021-09-11 MED ORDER — DIVALPROEX 250 MG TABLET,DELAYED RELEASE
500.0000 mg | DELAYED_RELEASE_TABLET | ORAL | Status: AC
Start: 2021-09-11 — End: 2021-09-11
  Administered 2021-09-11: 500 mg via ORAL

## 2021-09-11 MED ORDER — BENZTROPINE 1 MG TABLET
1.0000 mg | ORAL_TABLET | Freq: Two times a day (BID) | ORAL | 0 refills | Status: DC
Start: 2021-09-11 — End: 2021-10-01

## 2021-09-11 MED ORDER — DIVALPROEX 250 MG TABLET,DELAYED RELEASE
DELAYED_RELEASE_TABLET | ORAL | Status: AC
Start: 2021-09-11 — End: 2021-09-11
  Filled 2021-09-11: qty 2

## 2021-09-11 MED ORDER — PANTOPRAZOLE 20 MG TABLET,DELAYED RELEASE
20.0000 mg | DELAYED_RELEASE_TABLET | Freq: Every morning | ORAL | 1 refills | Status: DC
Start: 2021-09-11 — End: 2021-10-01

## 2021-09-11 MED ORDER — QUETIAPINE 100 MG TABLET
ORAL_TABLET | ORAL | Status: AC
Start: 2021-09-11 — End: 2021-09-11
  Filled 2021-09-11: qty 3

## 2021-09-11 MED ORDER — QUETIAPINE 100 MG TABLET
300.0000 mg | ORAL_TABLET | ORAL | Status: AC
Start: 2021-09-11 — End: 2021-09-11
  Administered 2021-09-11: 300 mg via ORAL

## 2021-09-11 MED ORDER — QUETIAPINE 300 MG TABLET
300.0000 mg | ORAL_TABLET | Freq: Two times a day (BID) | ORAL | 1 refills | Status: DC
Start: 2021-09-11 — End: 2021-10-01

## 2021-09-11 MED ORDER — ATORVASTATIN 10 MG TABLET
ORAL_TABLET | ORAL | Status: AC
Start: 2021-09-11 — End: 2021-09-11
  Filled 2021-09-11: qty 2

## 2021-09-11 MED ORDER — PANTOPRAZOLE 40 MG TABLET,DELAYED RELEASE
DELAYED_RELEASE_TABLET | ORAL | Status: AC
Start: 2021-09-11 — End: 2021-09-11
  Filled 2021-09-11: qty 1

## 2021-09-11 MED ORDER — BENZTROPINE 1 MG TABLET
ORAL_TABLET | ORAL | Status: AC
Start: 2021-09-11 — End: 2021-09-11
  Filled 2021-09-11: qty 1

## 2021-09-11 MED ORDER — BENZTROPINE 1 MG TABLET
1.0000 mg | ORAL_TABLET | Freq: Every day | ORAL | Status: DC
Start: 2021-09-12 — End: 2021-09-12
  Administered 2021-09-11: 1 mg via ORAL

## 2021-09-11 NOTE — ED Triage Notes (Signed)
Recently moved to area from [facility] and has ran out of medications. Hasn't had Depakote 500 mg BID for about 4-5 days now. Also doesn't have Seroquel, Protonix, Cogentin, or Lipitor.

## 2021-09-11 NOTE — ED Provider Notes (Signed)
Department of Emergency Medicine    HPI - 09/11/2021        Patient is a 36 y.o.  male presenting to the ED with chief complaint of needing medication refills.  Recently moved from [place] and is out of his medicines.  Needs he is feeling jittery and sweaty.  Denies SI or HI.  Eating and drinking okay.  No other complain           Physical Exam:     Nursing note and vitals reviewed in Epic.    Constitutional: Pt is well-developed and well-nourished. No acute distress.  Head: Normocephalic and atraumatic.   Eyes: Conjunctivae are normal. Pupils are equal, round, and reactive to light. EOM are intact  Neck: Soft, supple, full range of motion.  Cardiovascular: RRR.  No Murmurs/rubs/gallops. Distal pulses present and equal bilaterally.  Pulmonary/Chest: Normal BS BL with no distress. No audible wheezes or crackles are noted.  Abdominal: Soft, nontender, nondistended.  No rebound, guarding, or masses.  Musculoskeletal: Normal range of motion. No deformities.  Exhibits no edema and no tenderness.   Neurological: CNs 2-12 grossly intact. Free movement with normal strength and sensation in all extremities. No tremors. No facial droop or slurred speech. Alert and oriented x 3. No focal deficits noted.  Skin: Warm and dry. No rash or lesions  Psychiatric: Patient has a normal mood and affect.     MDM:   During the patient's stay in the emergency department, the above listed imaging and/or labs were performed to assist with medical decision making and were reviewed by myself when available for review.          Medical Decision Making  Single dose of medications were given here.  Prescriptions were written for 2 weeks +1 refill.  [facility].  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            Pt remained stable throughout the emergency department course.      Critical care time: 0    Clinical Impression   Mild major depression (CMS HCC) (Primary)         Discharged

## 2021-09-11 NOTE — ED APP Handoff Note (Signed)
[facility]  Emergency Department  Provider in Triage Note    Name: Donald Atkins  Age: 36 y.o.  Gender: male     Subjective:   Donald Atkins is a 36 y.o. male who presents with complaint of Medication Refill  .  Pt recently moved to [facility] from [facility] and has been out of his medications for 4-5 days. He reports fatigue and loss of apetite and nausea. He has a h/o seizures, GERD and hyperlipidemia.     Objective:   Filed Vitals:    09/11/21 2147   BP: (!) 131/100   Pulse: 85   Resp: 18   Temp: 37.2 C (99 F)   SpO2: 100%      Focused Physical Exam shows WDWN male patient.     Assessment:  A medical screening exam was completed.  This patient is a 36 y.o. male with initial findings showing medication refill.     Plan:  Please see initial orders and work-up below.  This is to be continued with full evaluation in the main Emergency Department.     No current facility-administered medications for this encounter.     No results found for this or any previous visit (from the past 24 hour(s)).     [place], PA-C  09/11/2021, 21:44

## 2021-10-01 ENCOUNTER — Encounter (HOSPITAL_COMMUNITY): Payer: Self-pay

## 2021-10-01 ENCOUNTER — Other Ambulatory Visit: Payer: Self-pay

## 2021-10-01 ENCOUNTER — Emergency Department
Admission: EM | Admit: 2021-10-01 | Discharge: 2021-10-02 | Disposition: A | Discharge status: Other Acute Inpatient Hospital | Payer: Medicaid - Out of State | Attending: PHYSICIAN ASSISTANT | Admitting: PHYSICIAN ASSISTANT

## 2021-10-01 ENCOUNTER — Encounter (HOSPITAL_COMMUNITY): Payer: Self-pay | Admitting: PHYSICIAN ASSISTANT

## 2021-10-01 DIAGNOSIS — Z9151 Personal history of suicidal behavior: Secondary | ICD-10-CM | POA: Insufficient documentation

## 2021-10-01 DIAGNOSIS — Z79899 Other long term (current) drug therapy: Secondary | ICD-10-CM | POA: Insufficient documentation

## 2021-10-01 DIAGNOSIS — F209 Schizophrenia, unspecified: Secondary | ICD-10-CM

## 2021-10-01 DIAGNOSIS — R45851 Suicidal ideations: Secondary | ICD-10-CM

## 2021-10-01 LAB — URINALYSIS, MACROSCOPIC
BILIRUBIN: NEGATIVE mg/dL
BLOOD: NEGATIVE mg/dL
GLUCOSE: NEGATIVE mg/dL
KETONES: NEGATIVE mg/dL
LEUKOCYTES: NEGATIVE WBCs/uL
NITRITE: NEGATIVE
PH: 6.5 (ref 5.0–9.0)
PROTEIN: NEGATIVE mg/dL
SPECIFIC GRAVITY: 1.013 (ref 1.002–1.030)
UROBILINOGEN: NORMAL mg/dL

## 2021-10-01 LAB — ECG 12 LEAD
Calculated T Axis: 52 degrees
QT Interval: 338 ms
Ventricular rate: 98 {beats}/min

## 2021-10-01 LAB — URINE DRUG SCREEN
AMPHET QL: NEGATIVE
BARB QL: NEGATIVE
BENZO QL: NEGATIVE
BUP QL: NEGATIVE
CANNAQL: NEGATIVE
COCQL: NEGATIVE
METHQL: NEGATIVE
OPIATE: NEGATIVE
OXYCODONE URINE: NEGATIVE
PCP QL: NEGATIVE
TRICYCLIC ANTIDEPRESSANTS URINE: NEGATIVE

## 2021-10-01 LAB — CBC WITH DIFF
BASOPHIL #: 0 10*3/uL (ref 0.00–0.30)
BASOPHIL %: 1 % (ref 0–3)
EOSINOPHIL #: 0 10*3/uL (ref 0.00–0.80)
EOSINOPHIL %: 1 % (ref 0–7)
HCT: 39.9 % — ABNORMAL LOW (ref 42.0–51.0)
HGB: 13.8 g/dL (ref 13.5–18.0)
LYMPHOCYTE #: 1.4 10*3/uL (ref 1.10–5.00)
LYMPHOCYTE %: 38 % (ref 25–45)
MCH: 29.6 pg (ref 27.0–32.0)
MCHC: 34.6 g/dL (ref 32.0–36.0)
MCV: 85.5 fL (ref 78.0–99.0)
MONOCYTE #: 0.3 10*3/uL (ref 0.00–1.30)
MONOCYTE %: 7 % (ref 0–12)
MPV: 7.6 fL (ref 7.4–10.4)
NEUTROPHIL #: 2 10*3/uL (ref 1.80–8.40)
NEUTROPHIL %: 53 % (ref 40–76)
PLATELETS: 240 10*3/uL (ref 140–440)
RBC: 4.66 10*6/uL (ref 4.20–6.00)
RDW: 14.3 % (ref 11.6–14.8)
WBC: 3.8 10*3/uL — ABNORMAL LOW (ref 4.0–10.5)
WBCS UNCORRECTED: 3.8 10*3/uL

## 2021-10-01 LAB — COMPREHENSIVE METABOLIC PANEL, NON-FASTING
ALBUMIN/GLOBULIN RATIO: 1.7 — ABNORMAL HIGH (ref 0.8–1.4)
ALBUMIN: 4.4 g/dL (ref 3.5–5.7)
ALKALINE PHOSPHATASE: 90 U/L (ref 34–104)
ALT (SGPT): 14 U/L (ref 7–52)
ANION GAP: 7 mmol/L — ABNORMAL LOW (ref 10–20)
AST (SGOT): 17 U/L (ref 13–39)
BILIRUBIN TOTAL: 0.5 mg/dL (ref 0.3–1.2)
BUN/CREA RATIO: 8 (ref 6–22)
BUN: 8 mg/dL (ref 7–25)
CALCIUM, CORRECTED: 8.8 mg/dL — ABNORMAL LOW (ref 8.9–10.8)
CALCIUM: 9.2 mg/dL (ref 8.6–10.3)
CHLORIDE: 109 mmol/L — ABNORMAL HIGH (ref 98–107)
CO2 TOTAL: 25 mmol/L (ref 21–31)
CREATININE: 0.96 mg/dL (ref 0.60–1.30)
ESTIMATED GFR: 106 mL/min/{1.73_m2} (ref >59)
GLOBULIN: 2.6 — ABNORMAL LOW (ref 2.9–5.4)
GLUCOSE: 118 mg/dL — ABNORMAL HIGH (ref 74–109)
OSMOLALITY, CALCULATED: 281 mOsm/kg (ref 270–290)
POTASSIUM: 3.9 mmol/L (ref 3.5–5.1)
PROTEIN TOTAL: 7 g/dL (ref 6.4–8.9)
SODIUM: 141 mmol/L (ref 136–145)

## 2021-10-01 LAB — THYROID STIMULATING HORMONE WITH FREE T4 REFLEX: TSH: 1.186 u[IU]/mL (ref 0.450–5.330)

## 2021-10-01 LAB — VALPROIC ACID LEVEL: VALPROIC ACID LEVEL: 103.6 ug/mL (ref 50.0–100.0)

## 2021-10-01 LAB — URINALYSIS, MICROSCOPIC
RBCS: <1 /hpf (ref <4)
SQUAMOUS EPITHELIAL: <1 /hpf (ref <28)
WBCS: 1 /hpf (ref <6)

## 2021-10-01 LAB — ACETAMINOPHEN LEVEL: ACETAMINOPHEN LEVEL: <10 ug/mL — ABNORMAL LOW (ref 10–30)

## 2021-10-01 LAB — ETHANOL, SERUM: ETHANOL: <10 mg/dL — ABNORMAL HIGH

## 2021-10-01 LAB — SALICYLATE ACID LEVEL: SALICYLATE LEVEL: <3 mg/dL (ref <=30)

## 2021-10-01 MED ORDER — HALOPERIDOL 5 MG TABLET
10.0000 mg | ORAL_TABLET | Freq: Two times a day (BID) | ORAL | Status: DC
Start: 2021-10-01 — End: 2021-10-02
  Administered 2021-10-01: 10 mg via ORAL
  Filled 2021-10-01: qty 2
  Filled 2021-10-01: qty 10

## 2021-10-01 MED ORDER — QUETIAPINE 100 MG TABLET
ORAL_TABLET | ORAL | Status: AC
Start: 2021-10-01 — End: 2021-10-01
  Filled 2021-10-01: qty 1

## 2021-10-01 MED ORDER — LORAZEPAM 1 MG TABLET
1.0000 mg | ORAL_TABLET | ORAL | Status: AC
Start: 2021-10-01 — End: 2021-10-01
  Administered 2021-10-01: 1 mg via ORAL

## 2021-10-01 MED ORDER — QUETIAPINE 100 MG TABLET
100.0000 mg | ORAL_TABLET | Freq: Two times a day (BID) | ORAL | Status: DC
Start: 2021-10-01 — End: 2021-10-02
  Administered 2021-10-01: 100 mg via ORAL

## 2021-10-01 MED ORDER — LORAZEPAM 1 MG TABLET
ORAL_TABLET | ORAL | Status: AC
Start: 2021-10-01 — End: 2021-10-01
  Filled 2021-10-01: qty 1

## 2021-10-01 MED ORDER — BENZTROPINE 1 MG TABLET
1.0000 mg | ORAL_TABLET | Freq: Two times a day (BID) | ORAL | Status: DC
Start: 2021-10-01 — End: 2021-10-02
  Administered 2021-10-01: 1 mg via ORAL

## 2021-10-01 MED ORDER — BENZTROPINE 1 MG TABLET
ORAL_TABLET | ORAL | Status: AC
Start: 2021-10-01 — End: 2021-10-01
  Filled 2021-10-01: qty 1

## 2021-10-01 NOTE — ED Nurses Note (Signed)
Called report to [facility] at this time.

## 2021-10-01 NOTE — ED APP Handoff Note (Signed)
Emergency Medicine      Name: Donald Atkins  Age and Gender: 36 y.o. male  Date of Birth: [birth year]  MRN: [id number]    ED Course Since Sign Out:  Care was assumed by myself. Patient has been stable while awaiting placement at the [facility]. He did request a dose of his home Cogentin, and so this was ordered. Otherwise, patient is stable for psychiatric admission.      Filed Vitals:    10/01/21 1602   BP: (!) 182/88   Pulse: (!) 109   Resp: 18   Temp: 36.3 C (97.4 F)   SpO2: 99%           Impression:   Clinical Impression   Schizophrenia (CMS HCC) (Primary)   Suicidal ideations         Disposition: Psych Facility      Discharge Instructions Given to Patient:        Portions of this note may have been dictated using voice recognition software.     Maryagnes Amos PA-C

## 2021-10-01 NOTE — ED Provider Notes (Signed)
[facility]  ED Primary Provider Note  History of Present Illness   Chief Complaint   Patient presents with   . Suicidal Thoughts      Donald Atkins is a 36 y.o. male who had concerns including Suicidal Thoughts .  Arrival: The patient arrived by Car    36 year old male presents ambulatory to the ED for auditory hallucinations and suicidal thoughts.  Patient states he has been hearing multiple voices on today telling him to walk out into traffic.  Patient states that this has been a chronic problem for him.  He states he had a similar episode approximately 1 year ago with multiple voices telling him to hang himself.  Patient did attempt to hang himself at that time.  Patient has a diagnosis schizophrenia.  He is currently on Depakote and Seroquel.  Patient states he has been taking these medications as prescribed does not feel that they are helping.  He denies any homicidal ideations.  Denies any somatic complaints.        Review of Systems   Pertinent positive and negative ROS as per HPI.  Historical Data   History Reviewed This Encounter: Medical History  Surgical History  Family History  Social History      Physical Exam   ED Triage Vitals [10/01/21 1602]   BP (Non-Invasive) (!) 182/88   Heart Rate (!) 109   Respiratory Rate 18   Temperature 36.3 C (97.4 F)   SpO2 99 %   Weight 81.6 kg (180 lb)   Height      Physical Exam  Vitals reviewed.   Constitutional:       Appearance: Normal appearance.   HENT:      Head: Normocephalic and atraumatic.   Cardiovascular:      Rate and Rhythm: Normal rate and regular rhythm.      Pulses: Normal pulses.      Heart sounds: Normal heart sounds.   Pulmonary:      Effort: Pulmonary effort is normal.      Breath sounds: Normal breath sounds.   Abdominal:      General: Abdomen is flat. Bowel sounds are normal.      Palpations: Abdomen is soft.   Musculoskeletal:         General: Normal range of motion.   Skin:     General: Skin is warm and  dry.   Neurological:      Mental Status: He is alert and oriented to person, place, and time.   Psychiatric:         Attention and Perception: Attention normal. He perceives auditory hallucinations.         Mood and Affect: Mood normal. Affect is flat.         Speech: Speech normal.         Behavior: Behavior normal.         Thought Content: Thought content includes suicidal ideation.         Cognition and Memory: Cognition normal.         Judgment: Judgment normal.       Patient Data     Labs Ordered/Reviewed   COMPREHENSIVE METABOLIC PANEL, NON-FASTING - Abnormal; Notable for the following components:       Result Value    CHLORIDE 109 (*)     ANION GAP 7 (*)     GLUCOSE 118 (*)     ALBUMIN/GLOBULIN RATIO 1.7 (*)     CALCIUM, CORRECTED  8.8 (*)     GLOBULIN 2.6 (*)     All other components within normal limits    Narrative:     Estimated Glomerular Filtration Rate (eGFR) is calculated using the CKD-EPI (2021) equation, intended for patients 30 years of age and older. If gender is not documented or "unknown", there will be no eGFR calculation.   ETHANOL, SERUM - Abnormal; Notable for the following components:    ETHANOL <10 (*)     All other components within normal limits   ACETAMINOPHEN LEVEL - Abnormal; Notable for the following components:    ACETAMINOPHEN LEVEL <10 (*)     All other components within normal limits   VALPROIC ACID LEVEL - Abnormal; Notable for the following components:    VALPROIC ACID LEVEL 103.6 (*)     All other components within normal limits   CBC WITH DIFF - Abnormal; Notable for the following components:    WBC 3.8 (*)     HCT 39.9 (*)     All other components within normal limits   URINALYSIS, MICROSCOPIC - Abnormal; Notable for the following components:    MUCOUS Rare (*)     All other components within normal limits   THYROID STIMULATING HORMONE WITH FREE T4 REFLEX - Normal   URINE DRUG SCREEN - Normal   SALICYLATE ACID LEVEL - Normal   URINALYSIS, MACROSCOPIC - Normal   CBC/DIFF     Narrative:     The following orders were created for panel order CBC/DIFF.  Procedure                               Abnormality         Status                     ---------                               -----------         ------                     CBC WITH GMWN[[id number]]                Abnormal            Final result                 Please view results for these tests on the individual orders.   URINALYSIS, MACROSCOPIC AND MICROSCOPIC W/CULTURE REFLEX    Narrative:     The following orders were created for panel order URINALYSIS, MACROSCOPIC AND MICROSCOPIC W/CULTURE REFLEX.  Procedure                               Abnormality         Status                     ---------                               -----------         ------                     URINALYSIS, MACROSCOPIC[[id number]]  Normal              Final result               URINALYSIS, MICROSCOPIC[[id number]]      Abnormal            Final result                 Please view results for these tests on the individual orders.     No orders to display     Medical Decision Making        Medical Decision Making  Patient had presented for medical clearance for auditory hallucinations and suicidal ideations.  Patient has a known history of schizophrenia.  He states he has been compliant with his medications.  Patient states that the voices have been telling him to walk out into traffic.  Given his history of previous suicide attempt patient is placed on 1 on 1.  Patient lab work shows mildly elevated Depakote level 103.  Therapeutic range is 100.  Patient is medically cleared for psychiatric placement.  Care be transferred to [facility] harm in [facility] pending placement.    Amount and/or Complexity of Data Reviewed  Labs: ordered.  ECG/medicine tests: ordered.                  Clinical Impression   Schizophrenia (CMS [place]) (Primary)   Suicidal ideations       Disposition: Corporate investment banker

## 2021-10-01 NOTE — ED Nurses Note (Signed)
REC TO ED 16, AMBULATORY FROM TRIAGE W/C/O SUICIDAL IDEATIONS W/PLAN TO RUN IN FRONT OF A CAR.Marland Kitchenthe patient SAY He HEARS VOICES THAT TELLS HIM TO DO THIS..he SAYS He HAD HAD SUICIDAL ATTEMPT A YEAR AGO, AND He SAYS He TRIED TO HAND HIMSELF...the patient IS CALM AND COOPERATIVE....DENIES HI....PTS MEDICATIONS HAVE LOCKED UP W/SECURITY AND ALSO HAS KNIFE. SISTER AT BEDSIDE.Marland KitchenMarland KitchenMarland Kitchen

## 2021-10-01 NOTE — ED Nurses Note (Signed)
[facility] UNABLE TO TRANSPORT TONIGHT. [facility] CALLED TO TRANSPORT.

## 2021-10-01 NOTE — ED Nurses Note (Signed)
PT UP AND IS PACING IN THE ROOM...I HAVE SPOKEN TO ED PROVIDER AND PT MEDICATED W/ATIVAN 1MG  PO...the patient IS NOW EATING His DINNER. Marland Kitchen

## 2021-10-01 NOTE — ED Triage Notes (Signed)
PT STATES He IS HAVING HALLUCINATIONS TELLING HIM TO RUN IN FRONT OF A CAR.

## 2021-10-02 ENCOUNTER — Encounter (HOSPITAL_PSYCHIATRIC): Payer: Self-pay | Admitting: Psychiatry

## 2021-10-02 ENCOUNTER — Encounter (HOSPITAL_PSYCHIATRIC): Payer: Self-pay

## 2021-10-02 ENCOUNTER — Inpatient Hospital Stay
Admission: RE | Admit: 2021-10-02 | Discharge: 2021-10-04 | DRG: 885 | Disposition: A | Discharge status: Other Acute Inpatient Hospital | Payer: Medicaid - Out of State | Source: Other Acute Inpatient Hospital | Attending: Psychiatry | Admitting: Psychiatry

## 2021-10-02 DIAGNOSIS — Z560 Unemployment, unspecified: Secondary | ICD-10-CM

## 2021-10-02 DIAGNOSIS — F250 Schizoaffective disorder, bipolar type: Principal | ICD-10-CM | POA: Diagnosis present

## 2021-10-02 DIAGNOSIS — F209 Schizophrenia, unspecified: Secondary | ICD-10-CM

## 2021-10-02 DIAGNOSIS — F2089 Other schizophrenia: Principal | ICD-10-CM

## 2021-10-02 DIAGNOSIS — R45851 Suicidal ideations: Secondary | ICD-10-CM | POA: Diagnosis present

## 2021-10-02 DIAGNOSIS — Z20822 Contact with and (suspected) exposure to covid-19: Secondary | ICD-10-CM | POA: Diagnosis present

## 2021-10-02 DIAGNOSIS — Z79899 Other long term (current) drug therapy: Secondary | ICD-10-CM

## 2021-10-02 DIAGNOSIS — F319 Bipolar disorder, unspecified: Secondary | ICD-10-CM

## 2021-10-02 DIAGNOSIS — R4183 Borderline intellectual functioning: Secondary | ICD-10-CM | POA: Diagnosis present

## 2021-10-02 DIAGNOSIS — G40909 Epilepsy, unspecified, not intractable, without status epilepticus: Secondary | ICD-10-CM | POA: Diagnosis present

## 2021-10-02 DIAGNOSIS — G8929 Other chronic pain: Secondary | ICD-10-CM | POA: Diagnosis present

## 2021-10-02 DIAGNOSIS — K219 Gastro-esophageal reflux disease without esophagitis: Secondary | ICD-10-CM | POA: Diagnosis present

## 2021-10-02 DIAGNOSIS — Z9151 Personal history of suicidal behavior: Secondary | ICD-10-CM

## 2021-10-02 DIAGNOSIS — G4700 Insomnia, unspecified: Secondary | ICD-10-CM

## 2021-10-02 DIAGNOSIS — F411 Generalized anxiety disorder: Secondary | ICD-10-CM

## 2021-10-02 DIAGNOSIS — Z658 Other specified problems related to psychosocial circumstances: Secondary | ICD-10-CM

## 2021-10-02 DIAGNOSIS — M549 Dorsalgia, unspecified: Secondary | ICD-10-CM | POA: Diagnosis present

## 2021-10-02 DIAGNOSIS — Z9152 Personal history of nonsuicidal self-harm: Secondary | ICD-10-CM

## 2021-10-02 MED ORDER — HALOPERIDOL 5 MG TABLET
10.0000 mg | ORAL_TABLET | Freq: Two times a day (BID) | ORAL | Status: DC
Start: 2021-10-02 — End: 2021-10-04
  Administered 2021-10-02 – 2021-10-04 (×6): 10 mg via ORAL
  Filled 2021-10-02 (×6): qty 2

## 2021-10-02 MED ORDER — ALUMINUM-MAG HYDROXIDE-SIMETHICONE 200 MG-200 MG-20 MG/5 ML ORAL SUSP
30.0000 mL | ORAL | Status: DC | PRN
Start: 2021-10-02 — End: 2021-10-04
  Filled 2021-10-02: qty 30

## 2021-10-02 MED ORDER — TRAZODONE 50 MG TABLET
50.0000 mg | ORAL_TABLET | Freq: Every evening | ORAL | Status: DC | PRN
Start: 2021-10-02 — End: 2021-10-04
  Administered 2021-10-02 – 2021-10-04 (×4): 50 mg via ORAL
  Filled 2021-10-02 (×4): qty 1

## 2021-10-02 MED ORDER — DIVALPROEX ER 500 MG TABLET,EXTENDED RELEASE 24 HR
750.0000 mg | ORAL_TABLET | Freq: Two times a day (BID) | ORAL | Status: DC
Start: 2021-10-02 — End: 2021-10-04
  Administered 2021-10-02 – 2021-10-04 (×6): 750 mg via ORAL
  Filled 2021-10-02 (×7): qty 1

## 2021-10-02 MED ORDER — MAGNESIUM HYDROXIDE 400 MG/5 ML ORAL SUSPENSION
30.0000 mL | Freq: Every evening | ORAL | Status: DC | PRN
Start: 2021-10-02 — End: 2021-10-04

## 2021-10-02 MED ORDER — BENZTROPINE 1 MG TABLET
1.0000 mg | ORAL_TABLET | Freq: Two times a day (BID) | ORAL | Status: DC
Start: 2021-10-02 — End: 2021-10-04
  Administered 2021-10-02 – 2021-10-04 (×6): 1 mg via ORAL
  Filled 2021-10-02 (×6): qty 1

## 2021-10-02 MED ORDER — QUETIAPINE 100 MG TABLET
100.0000 mg | ORAL_TABLET | Freq: Two times a day (BID) | ORAL | Status: DC
Start: 2021-10-02 — End: 2021-10-04
  Administered 2021-10-02 – 2021-10-04 (×6): 100 mg via ORAL
  Filled 2021-10-02 (×6): qty 1

## 2021-10-02 MED ORDER — ACETAMINOPHEN 325 MG TABLET
650.0000 mg | ORAL_TABLET | ORAL | Status: DC | PRN
Start: 2021-10-02 — End: 2021-10-04
  Administered 2021-10-03 – 2021-10-04 (×3): 650 mg via ORAL
  Filled 2021-10-02 (×4): qty 2

## 2021-10-02 MED ORDER — PANTOPRAZOLE 40 MG TABLET,DELAYED RELEASE
40.0000 mg | DELAYED_RELEASE_TABLET | Freq: Every day | ORAL | Status: DC
Start: 2021-10-02 — End: 2021-10-04
  Administered 2021-10-02 – 2021-10-04 (×3): 40 mg via ORAL
  Filled 2021-10-02 (×3): qty 1

## 2021-10-02 NOTE — ED Nurses Note (Deleted)
[facility] here to transport patient at this time. All belongings given to [facility] to be taken with patient.

## 2021-10-02 NOTE — Care Plan (Signed)
PT IS NEW ADMISSION THIS SHIFT  Problem: Adult Behavioral Health Plan of Care  Goal: Plan of Care Review  Outcome: Ongoing (see interventions/notes)  Goal: Patient-Specific Goal (Individualization)  Outcome: Ongoing (see interventions/notes)  Goal: Adheres to Safety Considerations for Self and Others  Outcome: Ongoing (see interventions/notes)  Goal: Absence of New-Onset Illness or Injury  Outcome: Ongoing (see interventions/notes)  Goal: Optimized Coping Skills in Response to Life Stressors  Outcome: Ongoing (see interventions/notes)  Goal: Develops/Participates in Therapeutic Alliance to Support Successful Transition  Outcome: Ongoing (see interventions/notes)  Intervention: Multimedia programmer  Recent Flowsheet Documentation  Taken 10/02/2021 0254 by Carlota Raspberry, RN  Trust Relationship/Rapport:   care explained   questions answered   questions encouraged   reassurance provided   thoughts/feelings acknowledged  Goal: Rounds/Family Conference  Outcome: Ongoing (see interventions/notes)     Problem: Psychotic Signs/Symptoms  Goal: Improved Behavioral Control (Psychotic Signs/Symptoms)  Outcome: Ongoing (see interventions/notes)  Goal: Optimal Cognitive Function (Psychotic Signs/Symptoms)  Outcome: Ongoing (see interventions/notes)  Intervention: Support and Promote Cognitive Ability  Recent Flowsheet Documentation  Taken 10/02/2021 0254 by Carlota Raspberry, RN  Trust Relationship/Rapport:   care explained   questions answered   questions encouraged   reassurance provided   thoughts/feelings acknowledged  Goal: Increased Participation and Engagement (Psychotic Signs/Symptoms)  Outcome: Ongoing (see interventions/notes)  Goal: Improved Mood Symptoms  Outcome: Ongoing (see interventions/notes)  Goal: Improved Psychomotor Symptoms (Psychotic Signs/Symptoms)  Outcome: Ongoing (see interventions/notes)  Goal: Decreased Sensory Symptoms (Psychotic Signs/Symptoms)  Outcome: Ongoing (see  interventions/notes)  Goal: Improved Sleep (Psychotic Signs/Symptoms)  Outcome: Ongoing (see interventions/notes)  Goal: Enhanced Social, Occupational or Functional Skills (Psychotic Signs/Symptoms)  Outcome: Ongoing (see interventions/notes)  Intervention: Promote Social, Occupational and Functional Ability  Recent Flowsheet Documentation  Taken 10/02/2021 0254 by Carlota Raspberry, RN  Trust Relationship/Rapport:   care explained   questions answered   questions encouraged   reassurance provided   thoughts/feelings acknowledged     Problem: Suicidal Behavior  Goal: Suicidal Behavior is Absent or Managed  Outcome: Ongoing (see interventions/notes)

## 2021-10-02 NOTE — Consults (Signed)
[facility]            Hospitalist Initial Consult Note    Donald Atkins, Donald Atkins, 36 y.o. male  Encounter Start Date:  10/02/2021  Inpatient Admission Date:  10/02/2021  Date of Birth:  [birth year]    Information Obtained from: {HISTORY PROVIDED BY:19225::"patient"}  Chief Complaint:  ***    HPI: Donald Atkins is a 36 y.o., White male who presents with ***      Past Medical History:   Diagnosis Date   . Bipolar disorder, unspecified (CMS HCC)    . Convulsions (CMS HCC)    . GERD (gastroesophageal reflux disease)    . Mixed hyperlipidemia    . Schizophrenia (CMS HCC)          {Past Surgical History Negative:25851}      Medications Prior to Admission     Prescriptions    benztropine (COGENTIN) 1 mg Oral Tablet    Take 1 Tablet (1 mg total) by mouth Twice daily    divalproex (DEPAKOTE ER) 250 mg Oral Tablet Sustained Release 24 hr    Take 3 Tablets (750 mg total) by mouth Twice daily    haloperidoL (HALDOL) 10 mg Oral Tablet    Take 1 Tablet (10 mg total) by mouth Twice daily    omeprazole (PRILOSEC) 20 mg Oral Capsule, Delayed Release(E.C.)    Take 1 Capsule (20 mg total) by mouth Once a day    QUEtiapine (SEROQUEL) 100 mg Oral Tablet    Take 1 Tablet (100 mg total) by mouth Twice daily        Allergies   Allergen Reactions   . Latex Rash   . Penicillins Anaphylaxis     Social History     Socioeconomic History   . Marital status: Unknown   Tobacco Use   . Smoking status: Never     Passive exposure: Never   . Smokeless tobacco: Never   Vaping Use   . Vaping Use: Never used   Substance and Sexual Activity   . Alcohol use: Never   . Drug use: Never   . Sexual activity: Not Currently     Past Family History:   ***    Unable to obtain:  {Unable to obtain:42027}    Exam:   Temperature: 36.7 C (98 F)  Heart Rate: 87  BP (Non-Invasive): 117/88  Respiratory Rate: 20  SpO2: 99 %  General-alert and oriented, no acute distress  Head-atraumatic  Eyes-pupils equal and reactive  ENT-normal to external  examination   Neck-supple, trachea midline  Cardiac-normal rate and rhythm  Lungs-Clear to auscultation bilaterally, no respiratory distress  Abdomen-soft, nontender, bowel sounds present  GU-no suprapubic tenderness  Extremities-no edema  Skin-no rashes or lesions noted  Neurological-grossly intact as tested  Cranial nerves-patient is able to follow my finger left and right without moving the head, patient able to squint and hold tightly, patient is able to bite and chew, patient is able to smile, patient is able to shrug shoulders, patient is able to stick out tongue in midline, patient is able to swallow, hearing appears to be intact, patient is able to read, patient states they can not smell.  LABS:  {[facility] IP [id number]    Radiology Results: {IMAGE SHNGIT:[id number]}     PLAN:    ***    Langston Masker, PA-C    [facility] Medicine Hospitalist  Venancio Poisson, PA-C

## 2021-10-02 NOTE — Nurses Notes (Signed)
7A-7P NURSE SHIFT NOTE  PT. IS MOSTLY WITHDRAWN TO ROOM.  He HAS STEADY GAIT WHEN AMBULATING.  PT. IS ALERT AND ORIENTED X 3.  He IS DRESSED IN PICU ATTIRE.  PT. DENIES SI, HI, A/V HALLUCINATIONS, DELUSIONS.  PT. RATES ANXIETY AND DEPRESSION 8/10.  DENIES PAIN.  PT. SAYS His APPETITE IS GOOD.  He DID EAT His MEALS IN His ROOM.  PT. REPORTS POOR SLEEP LAST NIGHT BUT DOES NOT ELABORATE AS TO WHY. He SAYS His BOWELS MOVED TODAY.  He IS POLITE WITH STAFF. He DOES INTERACT SOME WITH His ROOMMATE.  PT. IS COOPERATIVE WITH ASSESSMENT.  PT. IS NEW ADMIT, AWAITING NEW MEDICATION ORDERS.  WILL CONTINUE TO MONITOR WITH Q 15 MINUTE AND PRN VISUAL SAFETY CHECKS.

## 2021-10-02 NOTE — Progress Notes (Signed)
Patient's case was discussed in treatment team and initial treatment plan formulated.  I certify that the inpatient psychiatric admission is medically necessary for (1) treatment which is reasonably expected to improve the patient's condition, and (2) for diagnostic studies.  See Nurse Practitioner/Physician Assistant report/notes.  NP/PA will be providing face to face services with daily attending physician supervision.
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
## 2021-10-03 MED ORDER — LORAZEPAM 2 MG/ML INJECTION WRAPPER
2.0000 mg | Freq: Four times a day (QID) | INTRAMUSCULAR | Status: DC | PRN
Start: 2021-10-03 — End: 2021-10-04
  Administered 2021-10-03: 2 mg via INTRAMUSCULAR
  Filled 2021-10-03: qty 1

## 2021-10-03 MED ORDER — ZIPRASIDONE 20 MG/ML (FINAL CONCENTRATION) INTRAMUSCULAR SOLUTION
20.0000 mg | Freq: Two times a day (BID) | INTRAMUSCULAR | Status: DC | PRN
Start: 2021-10-03 — End: 2021-10-04
  Administered 2021-10-03: 20 mg via INTRAMUSCULAR
  Filled 2021-10-03: qty 1

## 2021-10-03 NOTE — Nurses Notes (Signed)
7A TO 7P NURSE NOTE; PATIENT IS DRESSED IN PICU ATTIRE AND HAS BEEN UP ON UNIT AD LIB. HAS FLAT AFFECT. STATES SLEEP AND APPETITE HAS BEEN GOOD. REPORTS LAST BM AS YESTERDAY.WHEN ASKED ABOUT WANTING TO HURT HIMSELF OR ANYONE ELSE. PATIENT ASKS IF He WOULD GET IN TROUBLE IF He  DID. ASSURED PATIENT WE ONLY WANTED TO KNOW SO WE COULD HELP HIM BETTER. He DID ACKNOWLEDGE THAT He WAS HERING VOICES TELLING HIM TO KILL HIMSELF AND  He DID HAVE THOUGHTS ABOUT IT. DENIES HAVING A PLAN. RATES ANXIETY AS AND DEPRESSION AS 10 ON 0 TO 10 SCALE WITH 10 BEING THE HIGHEST REPORTS. RATES ANXIETY AS 8 AND DEPRESSION AS 10 ON 0 TO 10 WITH 10 BEING THE HIGHEST.

## 2021-10-03 NOTE — Nurses Notes (Signed)
PATIENT UP TO DESK COMPLAINTS OF HURTING SELF. PATIENT UP MAKING TREATS TO HARM SELF AND ACCUSING STAFF OF THREAING HIM. STATES He SWALLOWED A SCREW AND SOMETHING FROM His SHOWER.  CHARGE NURSE NOTIFIED OF BEHAVIORS PROVIDER ON CALL NOTIFIED AND IM INJECTIONS ORDERED. PATIENT SITTING IN GERI CHAIR WITH SLEAVE UP WAITING ON INJECTIONS. PATIENT UP PACING IN HALL AND THREATENING STAFF AT WINDOW. PATIENT STUCK ARM THROUGH WINDOW AT NURSING STATION TRYING TO SCRATCH ARM ON WINDOW. PATIENT TOOK ARMBAND OFF AND CRAMMED IN MOUTH WHEN TRYING TO SCAN FOR MEDICATION. PATIENT SEATED IN GERI CHAIR FOR SAFETY AND TEARING OFF OBJECTS OF CHAIR AND PUTTING IN MOUTH. PATIENT ACUSSING STAFF OF CALLING NAMES AND NOT LISTENING TO HIM. PATIENT STATES "JUST LET ME LEAVE." PATIENT UNABLE TO REDIRECT  AND GERI CHAIR REMOVED FROM HALL. PATIENT NOW PACING IN HALL. CHARGE NURSE SPOKE WITH PATIENT. WILL CONTINUE TO MONITOR.
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
## 2021-10-03 NOTE — Nurses Notes (Signed)
PATIENT DENIES ANY SI/HI/AVT HALLUCINATIONS. PATIENT WITHDRAWN TO ROOM THIS SHIFT. RATES ANXIETY AND DEPRESSION 3/10. PATIENT OBSERVED RESTING IN ROOM WITH EYES CLOSED DURING THE Q15MIN AND PRN SAFETY CHECKS. WILL CONTINUE TO MONITOR PER PROTOCOL.

## 2021-10-03 NOTE — Care Plan (Signed)
PATIENT WITHDRAWN TO ROOM CALM AND COOPERATIVE    Problem: Adult Behavioral Health Plan of Care  Goal: Plan of Care Review  Outcome: Ongoing (see interventions/notes)  Goal: Patient-Specific Goal (Individualization)  Outcome: Ongoing (see interventions/notes)  Flowsheets (Taken 10/02/2021 2226)  Individualized Care Needs: NEED MEDICATION ADJUSTMENT  Goal: Strengths and Vulnerabilities  Outcome: Ongoing (see interventions/notes)  Goal: Adheres to Safety Considerations for Self and Others  Outcome: Ongoing (see interventions/notes)  Goal: Absence of New-Onset Illness or Injury  Outcome: Ongoing (see interventions/notes)  Goal: Optimized Coping Skills in Response to Life Stressors  Outcome: Ongoing (see interventions/notes)  Goal: Develops/Participates in Therapeutic Alliance to Support Successful Transition  Outcome: Ongoing (see interventions/notes)  Goal: Rounds/Family Conference  Outcome: Ongoing (see interventions/notes)     Problem: Psychotic Signs/Symptoms  Goal: Improved Behavioral Control (Psychotic Signs/Symptoms)  Outcome: Ongoing (see interventions/notes)  Goal: Optimal Cognitive Function (Psychotic Signs/Symptoms)  Outcome: Ongoing (see interventions/notes)  Goal: Increased Participation and Engagement (Psychotic Signs/Symptoms)  Outcome: Ongoing (see interventions/notes)  Goal: Improved Mood Symptoms  Outcome: Ongoing (see interventions/notes)  Goal: Improved Psychomotor Symptoms (Psychotic Signs/Symptoms)  Outcome: Ongoing (see interventions/notes)  Intervention: Manage Psychomotor Movement  Recent Flowsheet Documentation  Taken 10/02/2021 2226 by Onalee Hua, RN  Activity (Behavioral Health): up ad lib  Goal: Decreased Sensory Symptoms (Psychotic Signs/Symptoms)  Outcome: Ongoing (see interventions/notes)  Goal: Improved Sleep (Psychotic Signs/Symptoms)  Outcome: Ongoing (see interventions/notes)  Goal: Enhanced Social, Occupational or Functional Skills (Psychotic Signs/Symptoms)  Outcome: Ongoing (see  interventions/notes)     Problem: Suicidal Behavior  Goal: Suicidal Behavior is Absent or Managed  Outcome: Ongoing (see interventions/notes)     Problem: Health Knowledge, Opportunity to Enhance (Adult,Obstetrics,Pediatric)  Goal: Knowledgeable about Health Subject/Topic  Description: Patient will demonstrate the desired outcomes by discharge/transition of care.  Outcome: Ongoing (see interventions/notes)     Problem: Adult Treatment Plan  Goal: Patient will meet daily with attending physician.  Description: Physicians will evaluate patient, discuss diagnosis, prescribe medications and order diagnostic testing as clinically indicated. Physicians will monitor safety risks and assess when appropriate for discharge.  Outcome: Ongoing (see interventions/notes)  Goal: Patient will deny suicidal/homicidal ideation by day of discharge to Physician.  Outcome: Ongoing (see interventions/notes)  Goal: Patient will participate in 25% of morning affirmation and psychoeducational groups provided daily by Mental Health Specialist.  Outcome: Ongoing (see interventions/notes)  Goal: Patient will participate in 25% of therapy groups facilitated by Clinical Therapist.  Description: Topics will include Dialectical Behavior Therapy with the components of Mindfulness, Distress Tolerance, Emotion Regulation and Crisis Survival Skills, as well as Stress Management skills.  Outcome: Ongoing (see interventions/notes)  Goal: Patient will participate in 25% of experiential therapy group provided by Experiential Therapist.  Outcome: Ongoing (see interventions/notes)  Goal: Patient will participate in 25% of recreational groups provided by Recreational Therapist.  Outcome: Ongoing (see interventions/notes)

## 2021-10-04 DIAGNOSIS — E781 Pure hyperglyceridemia: Secondary | ICD-10-CM

## 2021-10-04 DIAGNOSIS — M549 Dorsalgia, unspecified: Secondary | ICD-10-CM

## 2021-10-04 LAB — URINALYSIS, MACROSCOPIC
BILIRUBIN: NEGATIVE mg/dL
BLOOD: NEGATIVE mg/dL
GLUCOSE: NEGATIVE mg/dL
KETONES: NEGATIVE mg/dL
LEUKOCYTES: NEGATIVE WBCs/uL
NITRITE: NEGATIVE
PH: 7.5 (ref 5.0–9.0)
PROTEIN: NEGATIVE mg/dL
SPECIFIC GRAVITY: 1.004 (ref 1.002–1.030)
UROBILINOGEN: NORMAL mg/dL

## 2021-10-04 LAB — ECG 12 LEAD
Atrial Rate: 98 {beats}/min
Atrial Rate: 94 {beats}/min
Calculated P Axis: 60 degrees
Calculated P Axis: 27 degrees
Calculated R Axis: 81 degrees
Calculated R Axis: 97 degrees
Calculated T Axis: 2 degrees
PR Interval: 140 ms
PR Interval: 142 ms
QRS Duration: 90 ms
QRS Duration: 96 ms
QT Interval: 342 ms
QTC Calculation: 431 ms
QTC Calculation: 427 ms
Ventricular rate: 94 {beats}/min

## 2021-10-04 LAB — LIPID PANEL
CHOL/HDL RATIO: 4.7
CHOLESTEROL: 165 mg/dL (ref <200)
HDL CHOL: 35 mg/dL (ref 23–92)
LDL CALC: 41 mg/dL (ref 0–100)
TRIGLYCERIDES: 447 mg/dL — ABNORMAL HIGH (ref <=150)

## 2021-10-04 LAB — URINE DRUG SCREEN
AMPHET QL: NEGATIVE
BARB QL: NEGATIVE
BENZO QL: NEGATIVE
BUP QL: NEGATIVE
CANNAQL: NEGATIVE
COCQL: NEGATIVE
METHQL: NEGATIVE
OPIATE: NEGATIVE
OXYCODONE URINE: NEGATIVE
PCP QL: NEGATIVE
TRICYCLIC ANTIDEPRESSANTS URINE: NEGATIVE

## 2021-10-04 LAB — HGA1C (HEMOGLOBIN A1C WITH EST AVG GLUCOSE): HEMOGLOBIN A1C: 5.2 % (ref 4.0–6.0)

## 2021-10-04 LAB — URINALYSIS, MICROSCOPIC: WBCS: <1 /hpf (ref <6)

## 2021-10-04 LAB — COMPREHENSIVE METABOLIC PANEL, NON-FASTING
ALBUMIN/GLOBULIN RATIO: 1.7 — ABNORMAL HIGH (ref 0.8–1.4)
ALBUMIN: 4.5 g/dL (ref 3.5–5.7)
ALKALINE PHOSPHATASE: 89 U/L (ref 34–104)
ALT (SGPT): 13 U/L (ref 7–52)
ANION GAP: 10 mmol/L (ref 10–20)
AST (SGOT): 16 U/L (ref 13–39)
BILIRUBIN TOTAL: 0.3 mg/dL (ref 0.3–1.2)
BUN/CREA RATIO: 13 (ref 6–22)
BUN: 13 mg/dL (ref 7–25)
CALCIUM, CORRECTED: 9.4 mg/dL (ref 8.9–10.8)
CALCIUM: 9.9 mg/dL (ref 8.6–10.3)
CHLORIDE: 104 mmol/L (ref 98–107)
CO2 TOTAL: 24 mmol/L (ref 21–31)
CREATININE: 1.01 mg/dL (ref 0.60–1.30)
ESTIMATED GFR: 100 mL/min/{1.73_m2} (ref >59)
GLOBULIN: 2.7 — ABNORMAL LOW (ref 2.9–5.4)
GLUCOSE: 96 mg/dL (ref 74–109)
OSMOLALITY, CALCULATED: 276 mOsm/kg (ref 270–290)
POTASSIUM: 3.8 mmol/L (ref 3.5–5.1)
PROTEIN TOTAL: 7.2 g/dL (ref 6.4–8.9)
SODIUM: 138 mmol/L (ref 136–145)

## 2021-10-04 LAB — COVID-19 ~~LOC~~ MOLECULAR LAB TESTING
INFLUENZA VIRUS TYPE A: NOT DETECTED
INFLUENZA VIRUS TYPE B: NOT DETECTED
RESPIRATORY SYNCTIAL VIRUS (RSV): NOT DETECTED
SARS-CoV-2: NOT DETECTED

## 2021-10-04 LAB — CBC
HCT: 43.8 % (ref 42.0–51.0)
HGB: 14.8 g/dL (ref 13.5–18.0)
MCH: 29.6 pg (ref 27.0–32.0)
MCHC: 33.9 g/dL (ref 32.0–36.0)
MCV: 87.3 fL (ref 78.0–99.0)
MPV: 8.2 fL (ref 7.4–10.4)
PLATELETS: 242 10*3/uL (ref 140–440)
RBC: 5.01 10*6/uL (ref 4.20–6.00)
RDW: 14.3 % (ref 11.6–14.8)
WBC: 5.9 10*3/uL (ref 4.0–10.5)
WBCS UNCORRECTED: 5.9 10*3/uL

## 2021-10-04 LAB — TRIGLYCERIDE: TRIGLYCERIDES: 447 mg/dL — ABNORMAL HIGH (ref <=150)

## 2021-10-04 LAB — GLUCOSE, NON FASTING: GLUCOSE: 97 mg/dL (ref 74–109)

## 2021-10-04 NOTE — Progress Notes (Signed)
[place] [facility] PROGRESS NOTE      Donald Atkins, Hervey  Date of Admission:  10/02/2021  Date of Birth:  May 06, 1986  Date of Service:  10/04/2021    Hospital Day:  LOS: 2 days     HPI:  Patient is ambulating the hallways of the PICU well today.  Patient was pleasant and cooperative with me.  It appears that he is being committed.  I was also told by staff that he may have swallowed a screw yesterday or last night.  Perhaps from the shower.  Patient denies any abdominal pain to me.  He denies any vomiting.  He denies hematemesis or hematochezia.  He does complain of back pain which appears to be chronic.  He states he puts "pain patches" on his back when it feels like this.  He states he may have lifted something too heavy a week or 2 ago but does not remember.  Again he appears quite comfortable    Vital Signs:  Temp (24hrs) Max:36.7 C (98 F)      Temperature: 36.6 C (97.8 F)  BP (Non-Invasive): 135/68  Heart Rate: 74  Respiratory Rate: 19  SpO2: 100 %    Current Medications:  acetaminophen (TYLENOL) tablet, 650 mg, Oral, Q4H PRN  aluminum-magnesium hydroxide-simethicone (MAG-AL PLUS) 200-200-20 mg per 5 mL oral liquid, 30 mL, Oral, Q4H PRN  benztropine (COGENTIN) tablet, 1 mg, Oral, 2x/day  divalproex (DEPAKOTE ER) extended release tablet 750 mg, 750 mg, Oral, 2x/day  haloperidol (HALDOL) tablet, 10 mg, Oral, 2x/day  LORazepam (ATIVAN) 2 mg/mL injection, 2 mg, IntraMUSCULAR, Q6H PRN  magnesium hydroxide (MILK OF MAGNESIA) 400mg  per 5mL oral liquid, 30 mL, Oral, HS PRN  pantoprazole (PROTONIX) delayed release tablet, 40 mg, Oral, Daily  QUEtiapine (SEROQUEL) tablet, 100 mg, Oral, 2x/day  traZODone (DESYREL) tablet, 50 mg, Oral, HS PRN - MR x 1  ziprasidone (GEODON) IM injection, 20 mg, IntraMUSCULAR, Q12H PRN        Current Orders:  Active Orders   Diet    DIET REGULAR Do you want to initiate MNT Protocol? Yes; Calorie amount: CC 2000; Special Tray Requirements: DISPOSABLES (paper &  plastic)     Frequency: All Meals     Number of Occurrences: 1 Occurrences   Nursing    BEHAVIORAL MED SAFETY CHECKS     Frequency: Q15MIN     Number of Occurrences: 999 Occurrences    ENCOURAGE AMBULATION AND ROM EXERCISES     Frequency: UNTIL DISCONTINUED     Number of Occurrences: Until Specified    PT IS LOW RISK FOR VENOUS THROMBOEMBOLISM     Frequency: CONTINUOUS     Number of Occurrences: Until Specified    SMOKING CESSATION EDUCATION     Frequency: UNTIL DISCONTINUED     Number of Occurrences: Until Specified    VITAL SIGNS EVERY 12 HRS (0800-2000)     Frequency: EVERY 12 HRS (0800-2000)     Number of Occurrences: Until Specified   Code Status    FULL CODE     Frequency: CONTINUOUS     Number of Occurrences: Until Specified   Consult    IP CONSULT TO HOSPITALIST On-Call Provider (nurse/clerk to determine) (D Galya Dunnigan)     Frequency: ONE TIME     Number of Occurrences: 1 Occurrences     Order Comments: Nurse may enter secondary order if modifications/details needed.       IP CONSULT TO HOSPITALIST Requested Provider; Louis MeckelYADAV, YOGINDER  Frequency: ONE TIME     Number of Occurrences: 1 Occurrences     Order Comments: Nurse may enter secondary order if modifications/details needed.      Cardiac Services    ECG 12 LEAD     Frequency: ONE TIME     Number of Occurrences: 1 Occurrences     Scheduling Instructions:               Precaution    BMED SPECIAL PRECAUTIONS OBSERVE EVERY FIFTEEN MINUTES     Frequency: UNTIL DISCONTINUED     Number of Occurrences: Until Specified    SUICIDE PRECAUTIONS LEVEL: MODERATE RISK PRECAUTIONS     Frequency: UNTIL DISCONTINUED     Number of Occurrences: Until Specified     Order Comments:        Medications    acetaminophen (TYLENOL) tablet     Frequency: Q4H PRN     Dose: 650 mg     Route: Oral    aluminum-magnesium hydroxide-simethicone (MAG-AL PLUS) 200-200-20 mg per 5 mL oral liquid     Frequency: Q4H PRN     Dose: 30 mL     Route: Oral    benztropine (COGENTIN) tablet      Frequency: 2x/day     Dose: 1 mg     Route: Oral    divalproex (DEPAKOTE ER) extended release tablet 750 mg     Frequency: 2x/day     Dose: 750 mg     Route: Oral    haloperidol (HALDOL) tablet     Frequency: 2x/day     Dose: 10 mg     Route: Oral    LORazepam (ATIVAN) 2 mg/mL injection     Frequency: Q6H PRN     Dose: 2 mg     Route: IntraMUSCULAR    magnesium hydroxide (MILK OF MAGNESIA) 400mg  per 48mL oral liquid     Frequency: HS PRN     Dose: 30 mL     Route: Oral    pantoprazole (PROTONIX) delayed release tablet     Frequency: Daily     Dose: 40 mg     Route: Oral    QUEtiapine (SEROQUEL) tablet     Frequency: 2x/day     Dose: 100 mg     Route: Oral    traZODone (DESYREL) tablet     Frequency: HS PRN - MR x 1     Dose: 50 mg     Route: Oral    ziprasidone (GEODON) IM injection     Frequency: Q12H PRN     Dose: 20 mg     Route: IntraMUSCULAR        Review of Systems:  Focused review of system was completed. Refer to the HPI for ROS details.     Today's Physical Exam:    General:  Patient is pleasant and cooperative.  Walking the halls.  HEENT: NC/AT   eyes  normal  Lungs:  Lungs clear, normal breathing, non-labored  CV: Regular rate and rhythm.   Abd:  non-tender, soft.  Extremities: no edema, normal appearance, pulses intact  Skin: no rashes, lesions,abscesses. Normal color.  Neuro: No focal deficits. Normal tone.         I/O:  I/O last 24 hours:  No intake or output data in the 24 hours ending 10/04/21 1128  I/O current shift:  No intake/output data recorded.    Labs  Please indicate ordered or reviewed)  Reviewed: Lab Results Today:  Results for orders placed or performed during the hospital encounter of 10/02/21 (from the past 24 hour(s))   LIPID PANEL   Result Value Ref Range    CHOLESTEROL 165 <200 mg/dL    TRIGLYCERIDES 657 (H) <=150 mg/dL    HDL CHOL 35 23 - 92 mg/dL    LDL CALC 41 0 - 846 mg/dL    VLDL CALC      CHOL/HDL RATIO 4.7    GLUCOSE, NON FASTING   Result Value Ref Range    GLUCOSE 97 74 - 109  mg/dL   TRIGLYCERIDE   Result Value Ref Range    TRIGLYCERIDES 447 (H) <=150 mg/dL   CBC   Result Value Ref Range    WBCS UNCORRECTED 5.9 x10^3/uL    WBC 5.9 4.0 - 10.5 x10^3/uL    RBC 5.01 4.20 - 6.00 x10^6/uL    HGB 14.8 13.5 - 18.0 g/dL    HCT 96.2 95.2 - 84.1 %    MCV 87.3 78.0 - 99.0 fL    MCH 29.6 27.0 - 32.0 pg    MCHC 33.9 32.0 - 36.0 g/dL    RDW 32.4 40.1 - 02.7 %    PLATELETS 242 140 - 440 x10^3/uL    MPV 8.2 7.4 - 10.4 fL   COMPREHENSIVE METABOLIC PANEL, NON-FASTING   Result Value Ref Range    SODIUM 138 136 - 145 mmol/L    POTASSIUM 3.8 3.5 - 5.1 mmol/L    CHLORIDE 104 98 - 107 mmol/L    CO2 TOTAL 24 21 - 31 mmol/L    ANION GAP 10 10 - 20 mmol/L    BUN 13 7 - 25 mg/dL    CREATININE 2.53 6.64 - 1.30 mg/dL    BUN/CREA RATIO 13 6 - 22    ESTIMATED GFR 100 >59 mL/min/1.43m^2    ALBUMIN 4.5 3.5 - 5.7 g/dL    CALCIUM 9.9 8.6 - 40.3 mg/dL    GLUCOSE 96 74 - 474 mg/dL    ALKALINE PHOSPHATASE 89 34 - 104 U/L    ALT (SGPT) 13 7 - 52 U/L    AST (SGOT) 16 13 - 39 U/L    BILIRUBIN TOTAL 0.3 0.3 - 1.2 mg/dL    PROTEIN TOTAL 7.2 6.4 - 8.9 g/dL    ALBUMIN/GLOBULIN RATIO 1.7 (H) 0.8 - 1.4    OSMOLALITY, CALCULATED 276 270 - 290 mOsm/kg    CALCIUM, CORRECTED 9.4 8.9 - 10.8 mg/dL    GLOBULIN 2.7 (L) 2.9 - 5.4   URINALYSIS, MACROSCOPIC   Result Value Ref Range    COLOR Yellow Colorless, Light Yellow, Yellow    APPEARANCE Clear Clear    SPECIFIC GRAVITY 1.004 1.002 - 1.030    PH 7.5 5.0 - 9.0    LEUKOCYTES Negative Negative, 100  WBCs/uL    NITRITE Negative Negative    PROTEIN Negative Negative, 10 , 20  mg/dL    GLUCOSE Negative Negative, 30  mg/dL    KETONES Negative Negative, Trace mg/dL    BILIRUBIN Negative Negative, 0.5 mg/dL    BLOOD Negative Negative mg/dL    UROBILINOGEN Normal Normal mg/dL   URINALYSIS, MICROSCOPIC   Result Value Ref Range    MUCOUS Rare (A) (none) /hpf    RBCS      WBCS <1 <6 /hpf          Problem List:  Active Hospital Problems   (*Primary Problem)    Diagnosis   . *Other schizophrenia (CMS HCC)    .  Suicidal ideation   . GAD (generalized anxiety disorder)       Assessment/ Plan:  Patient is quite stable for transfer to another facility from medical standpoint.  I am certainly not convinced that he swallowed anything but he certainly is asymptomatic as he has had no nausea vomiting, abdominal pain or bleeding.  Patient does have hypertriglyceridemia which could be addressed at a later date.  I would not feel comfortable starting him on anything for his triglycerides in this setting especially since he would likely be leaving today.  EKG reviewed.    Leonides Cave, PA-C
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
## 2021-10-04 NOTE — Care Plan (Signed)
PATIENT STATES SI PATIENT UNCOOPERATIVE THIS SHIFT.     Problem: Adult Behavioral Health Plan of Care  Goal: Plan of Care Review  Outcome: Ongoing (see interventions/notes)  Goal: Patient-Specific Goal (Individualization)  Outcome: Ongoing (see interventions/notes)  Flowsheets (Taken 10/04/2021 0043)  Individualized Care Needs: NEEDS MEDICATION ADJUSTMENT  Goal: Strengths and Vulnerabilities  Outcome: Ongoing (see interventions/notes)  Goal: Adheres to Safety Considerations for Self and Others  Outcome: Ongoing (see interventions/notes)  Goal: Absence of New-Onset Illness or Injury  Outcome: Ongoing (see interventions/notes)  Goal: Optimized Coping Skills in Response to Life Stressors  Outcome: Ongoing (see interventions/notes)  Goal: Develops/Participates in Therapeutic Alliance to Support Successful Transition  Outcome: Ongoing (see interventions/notes)  Goal: Rounds/Family Conference  Outcome: Ongoing (see interventions/notes)     Problem: Psychotic Signs/Symptoms  Goal: Improved Behavioral Control (Psychotic Signs/Symptoms)  Outcome: Ongoing (see interventions/notes)  Goal: Optimal Cognitive Function (Psychotic Signs/Symptoms)  Outcome: Ongoing (see interventions/notes)  Goal: Increased Participation and Engagement (Psychotic Signs/Symptoms)  Outcome: Ongoing (see interventions/notes)  Goal: Improved Mood Symptoms  Outcome: Ongoing (see interventions/notes)  Goal: Improved Psychomotor Symptoms (Psychotic Signs/Symptoms)  Outcome: Ongoing (see interventions/notes)  Intervention: Manage Psychomotor Movement  Recent Flowsheet Documentation  Taken 10/04/2021 0043 by Onalee Hua, RN  Activity (Behavioral Health): up ad lib  Goal: Decreased Sensory Symptoms (Psychotic Signs/Symptoms)  Outcome: Ongoing (see interventions/notes)  Goal: Improved Sleep (Psychotic Signs/Symptoms)  Outcome: Ongoing (see interventions/notes)  Goal: Enhanced Social, Occupational or Functional Skills (Psychotic Signs/Symptoms)  Outcome:  Ongoing (see interventions/notes)     Problem: Suicidal Behavior  Goal: Suicidal Behavior is Absent or Managed  Outcome: Ongoing (see interventions/notes)     Problem: Health Knowledge, Opportunity to Enhance (Adult,Obstetrics,Pediatric)  Goal: Knowledgeable about Health Subject/Topic  Description: Patient will demonstrate the desired outcomes by discharge/transition of care.  Outcome: Ongoing (see interventions/notes)     Problem: Adult Treatment Plan  Goal: Patient will meet daily with attending physician.  Description: Physicians will evaluate patient, discuss diagnosis, prescribe medications and order diagnostic testing as clinically indicated. Physicians will monitor safety risks and assess when appropriate for discharge.  Outcome: Ongoing (see interventions/notes)  Goal: Patient will deny suicidal/homicidal ideation by day of discharge to Physician.  Outcome: Ongoing (see interventions/notes)  Goal: Patient will participate in 25% of morning affirmation and psychoeducational groups provided daily by Mental Health Specialist.  Outcome: Ongoing (see interventions/notes)  Goal: Patient will participate in 25% of therapy groups facilitated by Clinical Therapist.  Description: Topics will include Dialectical Behavior Therapy with the components of Mindfulness, Distress Tolerance, Emotion Regulation and Crisis Survival Skills, as well as Stress Management skills.  Outcome: Ongoing (see interventions/notes)  Goal: Patient will participate in 25% of experiential therapy group provided by Experiential Therapist.  Outcome: Ongoing (see interventions/notes)  Goal: Patient will participate in 25% of recreational groups provided by Recreational Therapist.  Outcome: Ongoing (see interventions/notes)

## 2021-10-04 NOTE — Discharge Instructions (Signed)
Take medications as prescribed by provider.

## 2021-10-04 NOTE — Nurses Notes (Signed)
Patient being committed to [facility]. No nonverbal/verbal indications of pain, no s/s of distress noted. Will continue to visually monitor Q15 minutes.

## 2021-10-04 NOTE — Discharge Summary (Signed)
PRN [facility] Discharge Summary    Name: Donald Atkins  MRN: [id number]  Date of Admission: 10/02/2021   Discharge Date:  10/04/2021      Type of Discharge:  Commitment to the state hospital    Reason for hospitalization:  Suicidal ideation and command hallucination    Hospital Course:  Donald Atkins is a 36 year old white male who presents from [facility] with a history of schizophrenia.  He presented to the emergency department with an increase in suicidal ideation and auditory hallucinations.  Crisis intake states that he was hearing multiple voices telling him to walk into traffic.  Apparently this has been an ongoing issue for the patient and he has a longstanding history of mental illness. the patient appears to have a guardian in [place], Donald Atkins.  The patient was evaluated in PICU on day of admission peer He is calm and cordial on approach.  He states that he has been feeling more suicidal and depressed recently.  He has a longstanding history of mental illness and has been psychiatrically hospitalized multiple times at the [facility] in [place].  He tells me that he had been in a group home [facility] in [place] but was kicked out for violent behavior.  He states that he was sent to the state hospital and was just discharged after a 4 month stay.  He states that he has been visiting his sister who lives locally but the plan is for him to go back to a group home in [place].  His sister Donald Atkins at [phone] was unavailable by phone today.  I did attempt to contact Donald Atkins at the number listed on day of admission however there was no answer and no voicemail set up.  The patient states that he has been feeling more stressed out recently due to the recent hospitalization and having to go to a different group home.  He states he has been hearing multiple voices command him to walk into traffic.  He states that he  was recently started on the "Haldol shot "about a month ago while he was hospitalized in [place].  He states that he received the last injection a week ago.  He states he has been compliant with his other medications.  He was deemed medically stable in the ER prior to transfer.  His UDS was negative.  Alcohol level was less than 10.  Depakote level was 103.6.  He denies any recent illnesses or injuries.  He does not appear to be in any acute distress.    Patient was admitted to the hospital for stabilization of admission symptoms. Medicines were instituted and adjusted.  The patient has persistent and severe mental illness that has required numerous lengthy hospitalizations.  He has been committed to the [facility] and [facility] multiple times and was just recently discharged.  The patient has continued to endorse suicidal ideation as well as auditory hallucinations of voices commanding him to walk into traffic.  The patient has had self-harm behavior on the unit and also wrote a note stating that he wanted to die.  Based on the patient's presentation and history we feel that he requires a higher level of care offered at our facility.  The patient's family and [facility] guardian in [place] are aware and agree with the treatment plan and are also agreeable to participating in commitment hearing.  Discharge Medications:     Current Discharge Medication List      CONTINUE these medications - NO CHANGES were made during your visit.      Details   benztropine 1 mg Tablet  Commonly known as: COGENTIN   1 mg, Oral, 2 TIMES DAILY  Refills: 0     divalproex 250 mg Tablet Sustained Release 24 hr  Commonly known as: DEPAKOTE ER   750 mg, Oral, 2 TIMES DAILY  Refills: 0     haloperidoL 10 mg Tablet  Commonly known as: HALDOL   10 mg, Oral, 2 TIMES DAILY  Refills: 0     omeprazole 20 mg Capsule, Delayed Release(E.C.)  Commonly known as: PRILOSEC   20 mg, Oral, DAILY  Refills: 0     QUEtiapine 100 mg Tablet  Commonly  known as: SEROQUEL   100 mg, Oral, 2 TIMES DAILY  Refills: 0            Condition at discharge:  Stable for transfer with positive suicidal ideation and auditory hallucination    Discharge Diagnosis:  Axis I:1 schizophrenia, unspecified 2.  Generalized anxiety disorder, 3 differential diagnosis includes schizoaffective disorder, bipolar type and borderline intellectual function  Axis II:  Deferred  Axis III: Allergy to penicillin and latex.  No medical or surgical history reported.  No history of head trauma, LOC or seizure disorder.  Axis IV: Severe psychosocial stressors  Axis V:  Admission GAF: 20 , Best in past year: unknown    Follow Up plan:  Discharge to state hospital.    Time taken on day of discharge exceeded 30 minutes and included medication reconciliation, treatment team staffing, discussion with patient, review of record and completion of all documentation.    Donald Atkins, PMHNP-BC

## 2021-10-04 NOTE — Group Note (Signed)
Name: Deshae Jagielski   Date of Birth: 1986/05/21   Today's Date: 10/04/2021   Group Start Time: 1000   Group End Time: [id number]   Group Topic: ACTIVITY GROUP  Number of participants: 3      Summary of group discussion:       Group was offered, patients declined       Tyrann's Participation:did not participate

## 2021-10-04 NOTE — Progress Notes (Signed)
[facility]    Progress Note    Name: Donald Atkins  DOB: [birth year]  MRN: [id number]    Patient seen today in follow up of continuing psychiatric symptoms. Notes and labs reviewed. The patient's clinical information and treatment decisions were personally discussed with attending physician and discussed in treatment team. Case discussed with treatment team and hospitalist. Today the patient is up and about on the unit.  He is observed ambulatory in the hallway and is drinking coffee.  He is common cordial on approach and states "I am not doing good. " he continues to endorse suicidal ideation and auditory hallucinations.  He states that he swallowed a screw from the shower last night.  The shower was inspected by staff and there does not appear to be any missing hardware however the patient does have a documented history of ingesting foreign bodies in the past.  He has no physical complaints and does not appear to be in any acute distress.  He has also been evaluated by the hospitalist.  The patient states he wants to stay in the hospital and have long-term treatment.  I discussed the commitment process with him, he verbalized understanding and states "I want to be committed. "  Based on the patient's presentation, complaints and reported self-harm behavior on the unit we are going to proceed with filing commitment paperwork.  The patient has a severe and persistent history of mental illness and has required numerous hospitalizations in the past and was just released from the [facility] in [place].  He is denying any homicidal ideations.  Overall he has been cooperative with staff and compliant with medications.  I spoke with the patient's sister Marcelle Smiling and updated her on the patient's status and commitment process.  She verbalized understanding and is agreeable.  She would also like to participate in the hearing.  She states that she is not surprised by this behavior and that the  patient has done this before.  I was also able to speak with the [facility] representative in [facility] and updated her on the situation.  She verbalized understanding and agrees as well.  She would also be available to participate in the hearing if needed peer to participate in the hearing if needed.  She states that the patient typically requires commitment with long-term treatment for his persistent and severe mental illness.            Takes meds. No reported side effects. Sleep is good, Appetite good. No prn meds required. No physical complaints on review of systems.    Alert and oriented x4.  Dressed in PICU scrubs, calm, disheveled with multiple facial tattoos.  Passive suicidal ideation with no plan or intent.  Intermittent auditory hallucinations.  No delusions, or paranoia. Thoughts are logical, coherent, goal directed. Good eye contact. Speech is normal rate and tone. Mood is ``ok affect congruent. No psychomotor agitation or psychomotor retardation, no cogwheel rigidity or abnormal movements. Gait is not observed. Attention is good. Concentration and memory good. No cognitive deficits noted. Judgment fair, insight fair. Calculation and abstraction are within normal limits.      Diagnosis:  Axis I:1 schizophrenia, unspecified 2.  Generalized anxiety disorder, 3 differential diagnosis includes schizoaffective disorder, bipolar type and borderline intellectual function  Axis II:  Deferred      Plan:    We discussed meds, side effects, and treatment options, need for sobriety and compliance and prognosis. I encouraged groups and  working on Pharmacologist. To work with counselors as indicated.  We will proceed with filing commitment paperwork.    I will continue medications. Watch for problems. I encouraged her to participate in groups and reinforced that compliance is key for stability.     Changes:  I will make no medication changes at this time.  We will continue his regular medications and allow time for  further improvement.     Continued support, safety, groups, observation and allow time for further improvement.    I certify that this psychiatric inpatient admission is medically necessary for treatment which can reasonably be expected to improve the patients condition.  Evaluation exceeded 30 minutes today and included phone call with the patient's family.    The evaluation today required extensive level of service due to level of complexity. Visit exceeded 35 minutes and included treatment team staffing, discussion with the patient, discussion with family, discussion with [employer] representative and completion of mental hygiene paperwork.        Rhen Dossantos, PMHNP-BC

## 2021-10-04 NOTE — Behavioral Health (Signed)
Obtain copy of detainment order, hearing is scheduled for 7:00 p.m.Marland Kitchen  Attending and charge nurse notified.  Copy of order placed on patient's chart.

## 2021-10-04 NOTE — Behavioral Health (Signed)
Providing case coverage in lieu of primary therapist's schedule day off and was notified by attending in plan that she will file Mental Hygiene today.  Charge nurse [employer] ordering all labs in reference to medical clearance and has spoke with hospitalist as well.  Attending will advise once paperwork is completed so that Mental Hygiene could be processed.    I placed call to patient's guardian rebecca Renae Fickle [phone], she speaking with her regarding case coverage, patient admission occurring the weekend and attending plan today to file Mental Hygiene explaining Mental Hygiene process.  She is aware that should patient be committed he will likely go to the state [facility] in [place].  Advised that I would be able to update outcome of hearing likely tomorrow however provide her direct contact number information.  While speaking with her on the fact attending was attempting to contact her therefore transferred call to attending.

## 2021-10-04 NOTE — Behavioral Health (Addendum)
Received mental hygiene petition from attending, assisted her in filiing of mental hygiene with staff at [facility].  I spoke with Toney Reil at [facility] advising of mental hygiene petition and forward via secure e-mail petition along with medical information for review.  She was made aware that should patient be committed we are requesting transfer to the state hospital.    Attending informed me that she had spoke both with [facility] guardian as well as patient's sister and included their information on hygiene petition.    Obtained remainder of medical information required for hearing (labs and d/c summary) and forwarded to [place] at [facility] via secure e-mail.

## 2021-10-04 NOTE — Behavioral Health (Signed)
Met with patient individually on unit explaining commitment process which will take place this afternoon.  Patient states understanding noting he is been in state hospitals before verbalizing he wants to continue to work on his problems and wants to go there.    Unit charge nurse aware of commitment time estimated to occur at 7:00 p.m.Marland KitchenMarland Kitchen

## 2021-10-05 NOTE — Nurses Notes (Signed)
[id number] Nurse Note: Patient up ad lib on unit this shift. Voices SI no plan. Voices auditory and visual hallucinations, but did not elaborate when asked. Denies HI, anxiety, and depression. Patient alert and oriented to person and place during assessment. Patient calm and cooperative with medications and nursing assessments. Flat affect noted. Snacks and drinks offered. No nonverbal/verbal indications of pain, no s/s of distress noted. See MAR/Flowsheets for medication administrations and nursing assessments. Will continue to visually monitor Q15 minutes and PRN per protocol.

## 2021-12-25 ENCOUNTER — Other Ambulatory Visit: Payer: Self-pay

## 2021-12-25 ENCOUNTER — Emergency Department
Admission: EM | Admit: 2021-12-25 | Discharge: 2021-12-26 | Disposition: A | Discharge status: Other Acute Inpatient Hospital | Payer: Medicaid - Out of State | Attending: FAMILY PRACTICE | Admitting: FAMILY PRACTICE

## 2021-12-25 ENCOUNTER — Encounter (HOSPITAL_BASED_OUTPATIENT_CLINIC_OR_DEPARTMENT_OTHER): Payer: Self-pay

## 2021-12-25 ENCOUNTER — Emergency Department (HOSPITAL_BASED_OUTPATIENT_CLINIC_OR_DEPARTMENT_OTHER): Payer: Medicaid - Out of State

## 2021-12-25 DIAGNOSIS — T189XXA Foreign body of alimentary tract, part unspecified, initial encounter: Secondary | ICD-10-CM | POA: Insufficient documentation

## 2021-12-25 DIAGNOSIS — R45851 Suicidal ideations: Secondary | ICD-10-CM | POA: Insufficient documentation

## 2021-12-25 DIAGNOSIS — F209 Schizophrenia, unspecified: Secondary | ICD-10-CM | POA: Insufficient documentation

## 2021-12-25 DIAGNOSIS — F32A Depression, unspecified: Secondary | ICD-10-CM | POA: Insufficient documentation

## 2021-12-25 LAB — CBC WITH DIFF
BASOPHIL #: 0.03 10*3/uL (ref 0.00–0.30)
BASOPHIL %: 1 % (ref 0–3)
EOSINOPHIL #: 0.07 10*3/uL (ref 0.00–0.80)
EOSINOPHIL %: 1 % (ref 0–7)
HCT: 42.1 % (ref 42.0–51.0)
HGB: 14.1 g/dL (ref 13.5–18.0)
LYMPHOCYTE #: 1.89 10*3/uL (ref 1.10–5.00)
LYMPHOCYTE %: 33 % (ref 25–45)
MCH: 29.3 pg (ref 27.0–32.0)
MCHC: 33.4 g/dL (ref 32.0–36.0)
MCV: 87.6 fL (ref 78.0–99.0)
MONOCYTE #: 0.39 10*3/uL (ref 0.00–1.30)
MONOCYTE %: 7 % (ref 0–12)
MPV: 8.5 fL (ref 7.4–10.4)
NEUTROPHIL #: 3.38 10*3/uL (ref 1.80–8.40)
NEUTROPHIL %: 59 % (ref 40–76)
PLATELETS: 291 10*3/uL (ref 140–440)
RBC: 4.81 10*6/uL (ref 4.20–6.00)
RDW: 16.1 % — ABNORMAL HIGH (ref 11.6–14.8)
WBC: 5.8 10*3/uL (ref 4.0–10.5)

## 2021-12-25 LAB — BASIC METABOLIC PANEL
ANION GAP: 10 mmol/L (ref 10–20)
BUN/CREA RATIO: 14
BUN: 13 mg/dL (ref 7–18)
CALCIUM: 9.4 mg/dL (ref 8.5–10.1)
CHLORIDE: 106 mmol/L (ref 98–107)
CO2 TOTAL: 27 mmol/L (ref 21–32)
CREATININE: 0.9 mg/dL (ref 0.70–1.30)
ESTIMATED GFR: 114 mL/min/{1.73_m2} (ref >59)
GLUCOSE: 96 mg/dL (ref 74–106)
OSMOLALITY, CALCULATED: 285 mOsm/kg (ref 270–290)
POTASSIUM: 3.8 mmol/L (ref 3.5–5.1)
SODIUM: 143 mmol/L (ref 136–145)

## 2021-12-25 LAB — URINALYSIS, MACRO/MICRO
BILIRUBIN: NEGATIVE mg/dL
BLOOD: NEGATIVE mg/dL
GLUCOSE: NEGATIVE mg/dL
KETONES: NEGATIVE mg/dL
LEUKOCYTES: NEGATIVE WBCs/uL
NITRITE: NEGATIVE
PH: 5.5 (ref 4.6–8.0)
PROTEIN: NEGATIVE mg/dL
SPECIFIC GRAVITY: >=1.03 (ref 1.003–1.035)
UROBILINOGEN: 0.2 mg/dL (ref 0.2–1.0)

## 2021-12-25 LAB — URINE DRUG SCREEN
AMPHETAMINES URINE: NEGATIVE
BARBITURATES URINE: NEGATIVE
BENZODIAZEPINES URINE: NEGATIVE
CANNABINOIDS URINE: NEGATIVE
COCAINE METABOLITES URINE: NEGATIVE
METHADONE URINE: NEGATIVE
OPIATES URINE: NEGATIVE
PCP URINE: NEGATIVE

## 2021-12-25 LAB — SALICYLATE ACID LEVEL: SALICYLATE LEVEL: 1 mg/dL — ABNORMAL LOW (ref 3–20)

## 2021-12-25 LAB — ETHANOL, SERUM: ETHANOL: <3 mg/dL (ref <=3)

## 2021-12-25 LAB — ACETAMINOPHEN LEVEL: ACETAMINOPHEN LEVEL: 0

## 2021-12-25 NOTE — ED Attending Note (Addendum)
[facility] emergency department         HISTORY OF PRESENT ILLNESS     Date:  12/25/2021  Patient's Name:  Donald Atkins  Date of Birth:  15-May-1986    Patient is a 36 year old with a past medical history of schizophrenia bipolar disorder patient states he has been compliant with his medication but the do not seem to be working.  Over the last week he is had worsening auditory hallucinations telling him to kill himself by jumping in front of a car hang himself.  Patient states he has also been having thoughts of hurting others.  He denies any visual hallucinations.  Patient states he needs help because of the worsening thoughts and he   can not control himself and today he was seen by the police walking in front of cars.  He was dropped off to the hospital by the sheriff's department.  Patient is in need of inpatient psychiatry evaluation          Review of Systems     Review of Systems   HENT: Negative.    Respiratory: Negative.    Cardiovascular: Negative.    Gastrointestinal: Negative.    Endocrine: Negative.    Musculoskeletal: Negative.    Neurological: Negative.    Psychiatric/Behavioral: Positive for hallucinations and suicidal ideas.   All other systems reviewed and are negative.      Previous History     Past Medical History:  Past Medical History:   Diagnosis Date   . Bipolar disorder, unspecified (CMS HCC)    . Convulsions (CMS HCC)    . GERD (gastroesophageal reflux disease)    . Mixed hyperlipidemia    . Schizophrenia (CMS HCC)        Past Surgical History:  History reviewed. No pertinent surgical history.    Social History:  Social History     Tobacco Use   . Smoking status: Never     Passive exposure: Never   . Smokeless tobacco: Never   Vaping Use   . Vaping Use: Never used   Substance Use Topics   . Alcohol use: Never   . Drug use: Never     Social History     Substance and Sexual Activity   Drug Use Never       Family History:  No family history on  file.    Medication History:  Current Outpatient Medications   Medication Sig   . benztropine (COGENTIN) 1 mg Oral Tablet Take 1 Tablet (1 mg total) by mouth Twice daily   . divalproex (DEPAKOTE ER) 250 mg Oral Tablet Sustained Release 24 hr Take 3 Tablets (750 mg total) by mouth Twice daily   . hydrOXYzine pamoate (VISTARIL) 50 mg Oral Capsule Take 1 Capsule (50 mg total) by mouth Three times a day as needed for Itching   . lithium carbonate (ESKALITH) 300 mg Oral Capsule Take 1 Capsule (300 mg total) by mouth Once a day       Allergies:  Allergies   Allergen Reactions   . Latex Rash   . Penicillins Anaphylaxis       Physical Exam     Vitals:    BP 130/86   Pulse 75   Temp 36.7 C (98.1 F)   Resp 20   Ht 1.88 m (6\' 2" )   Wt 93 kg (205 lb)   SpO2 98%   BMI 26.32 kg/m  Physical Exam  Vitals and nursing note reviewed.   Constitutional:       General: He is not in acute distress.     Appearance: He is well-developed.   HENT:      Head: Normocephalic and atraumatic.      Right Ear: Tympanic membrane normal.      Left Ear: Tympanic membrane normal.      Nose: Nose normal.      Mouth/Throat:      Mouth: Mucous membranes are moist.   Eyes:      Extraocular Movements: Extraocular movements intact.      Conjunctiva/sclera: Conjunctivae normal.      Pupils: Pupils are equal, round, and reactive to light.   Cardiovascular:      Rate and Rhythm: Normal rate and regular rhythm.      Heart sounds: No murmur heard.  Pulmonary:      Effort: Pulmonary effort is normal. No respiratory distress.      Breath sounds: Normal breath sounds.   Abdominal:      General: Bowel sounds are normal.      Palpations: Abdomen is soft.      Tenderness: There is no abdominal tenderness.   Musculoskeletal:         General: No swelling. Normal range of motion.      Cervical back: Normal range of motion and neck supple.   Skin:     General: Skin is warm and dry.      Capillary Refill: Capillary refill takes less than 2 seconds.    Neurological:      General: No focal deficit present.      Mental Status: He is alert and oriented to person, place, and time.   Psychiatric:         Mood and Affect: Mood normal.         Behavior: Behavior normal.         Thought Content: Thought content normal.         Diagnostic Studies/Treatment     Medications:       New Prescriptions    No medications on file       Labs:    Results for orders placed or performed during the hospital encounter of 12/25/21 (from the past 12 hour(s))   URINE DRUG SCREEN   Result Value Ref Range    AMPHETAMINES URINE Negative Negative    BARBITURATES URINE Negative Negative    BENZODIAZEPINES URINE Negative Negative    METHADONE URINE Negative Negative    COCAINE METABOLITES URINE Negative Negative    OPIATES URINE Negative Negative    PCP URINE Negative Negative    CANNABINOIDS URINE Negative Negative   URINALYSIS, MACRO/MICRO   Result Value Ref Range    COLOR Light Yellow (A) Yellow    APPEARANCE Clear Clear    SPECIFIC GRAVITY >=1.030 1.003 - 1.035    PH 5.5 4.6 - 8.0    LEUKOCYTES Negative Negative WBCs/uL    NITRITE Negative Negative    PROTEIN Negative Negative mg/dL    GLUCOSE Negative Negative mg/dL    KETONES Negative Negative mg/dL    BILIRUBIN Negative Negative mg/dL    BLOOD Negative Negative mg/dL    UROBILINOGEN 0.2 0.2 - 1.0 mg/dL   BASIC METABOLIC PANEL   Result Value Ref Range    SODIUM 143 136 - 145 mmol/L    POTASSIUM 3.8 3.5 - 5.1 mmol/L    CHLORIDE 106 98 - 107 mmol/L  CO2 TOTAL 27 21 - 32 mmol/L    ANION GAP 10 10 - 20 mmol/L    CALCIUM 9.4 8.5 - 10.1 mg/dL    GLUCOSE 96 74 - 102 mg/dL    BUN 13 7 - 18 mg/dL    CREATININE 7.25 3.66 - 1.30 mg/dL    BUN/CREA RATIO 14     ESTIMATED GFR 114 >59 mL/min/1.10m^2    OSMOLALITY, CALCULATED 285 270 - 290 mOsm/kg   ETHANOL, SERUM   Result Value Ref Range    ETHANOL <3 <=3 mg/dL   ACETAMINOPHEN LEVEL   Result Value Ref Range    ACETAMINOPHEN LEVEL 0    SALICYLATE ACID LEVEL   Result Value Ref Range    SALICYLATE LEVEL  1 (L) 3 - 20 mg/dL        Radiology:  None    No orders to display       ECG:  NONE            Differential diagnosis  Schizophrenia exacerbation, suicidal ideation, depression,    Course/Disposition/Plan     Course:     ED Course as of 12/26/21 0551   Sun Dec 26, 2021   0230 Patient became very belligerent.  After labs and urine was obtained patient stated that he wanted to leave the emergency room.  Patient had threatened to hang himself or walk in front of a car.  Patient did not listen to reason and we advised him to remain and be evaluated by Psychiatry.  However patient refused at this point I did do commitment papers given the high possibility that the patient would do harm to himself or others.   4403 Patient is resting presently after receiving IM Geodon.  Patient had become belligerent and agitated restless pacing in the room.  We did give him Vistaril and his nighttime meds with no good response.    patient will be evaluated for inpatient psychiatric care    Disposition:    Psych Facility    Condition at Disposition:    Stable    Follow up:   No follow-up provider specified.    Clinical Impression:     Clinical Impression   Schizophrenia, chronic with acute exacerbation (CMS HCC) (Primary)   Depression with suicidal ideation         Tivis Ringer, MD

## 2021-12-25 NOTE — ED Triage Notes (Signed)
Pt c/o si/hi. States that he hears voices that are telling him to "throw myself infront of a car". PT states that he has been to [facility] for this in the past but that symptoms have gotten worse. Wants to go back to [facility].

## 2021-12-25 NOTE — ED Nurses Note (Signed)
PT states he wants to leave because he is bored here and feels it is to late to help him and if he gets out the door, he will run un front of a car and kill himself. States he needs help, but doesn't want to wait for possible bed placement. MD at bedside and talked to pt about committing him if he decides to try to leave. Police will also be called. PT states he will comply at this time with myself and security at bedside.

## 2021-12-25 NOTE — ED Nurses Note (Signed)
Pt placed in paper scrubs, personal belongings placed in secure area. Will continue to monitor.

## 2021-12-25 NOTE — ED Nurses Note (Signed)
Pt requesting to leave ER, physician notified.

## 2021-12-26 ENCOUNTER — Encounter (HOSPITAL_COMMUNITY): Payer: Self-pay

## 2021-12-26 ENCOUNTER — Emergency Department (HOSPITAL_BASED_OUTPATIENT_CLINIC_OR_DEPARTMENT_OTHER): Payer: Medicaid - Out of State

## 2021-12-26 ENCOUNTER — Inpatient Hospital Stay (HOSPITAL_PSYCHIATRIC): Admit: 2021-12-26 | Payer: Medicaid - Out of State | Source: Other Acute Inpatient Hospital | Admitting: Psychiatry

## 2021-12-26 ENCOUNTER — Encounter (HOSPITAL_BASED_OUTPATIENT_CLINIC_OR_DEPARTMENT_OTHER): Payer: Self-pay | Admitting: FAMILY PRACTICE

## 2021-12-26 LAB — VALPROIC ACID LEVEL: VALPROIC ACID LEVEL: <10 ug/mL — ABNORMAL LOW (ref 50.0–100.0)

## 2021-12-26 MED ORDER — HYDROXYZINE PAMOATE 25 MG CAPSULE
ORAL_CAPSULE | ORAL | Status: AC
Start: 2021-12-26 — End: 2021-12-26
  Filled 2021-12-26: qty 2

## 2021-12-26 MED ORDER — ZIPRASIDONE 20 MG/ML (FINAL CONCENTRATION) INTRAMUSCULAR SOLUTION
INTRAMUSCULAR | Status: AC
Start: 2021-12-26 — End: 2021-12-26
  Filled 2021-12-26: qty 1

## 2021-12-26 MED ORDER — DIVALPROEX 250 MG TABLET,DELAYED RELEASE
DELAYED_RELEASE_TABLET | ORAL | Status: AC
Start: 2021-12-26 — End: 2021-12-26
  Filled 2021-12-26: qty 3

## 2021-12-26 MED ORDER — HYDROXYZINE PAMOATE 25 MG CAPSULE
50.0000 mg | ORAL_CAPSULE | ORAL | Status: AC
Start: 2021-12-26 — End: 2021-12-26
  Administered 2021-12-26: 50 mg via ORAL

## 2021-12-26 MED ORDER — DIVALPROEX 250 MG TABLET,DELAYED RELEASE
500.0000 mg | DELAYED_RELEASE_TABLET | Freq: Two times a day (BID) | ORAL | Status: DC
Start: 2021-12-26 — End: 2021-12-26
  Administered 2021-12-26: 500 mg via ORAL

## 2021-12-26 MED ORDER — DIVALPROEX 250 MG TABLET,DELAYED RELEASE
DELAYED_RELEASE_TABLET | ORAL | Status: AC
Start: 2021-12-26 — End: 2021-12-26
  Filled 2021-12-26: qty 2

## 2021-12-26 MED ORDER — ZIPRASIDONE 20 MG/ML (FINAL CONCENTRATION) INTRAMUSCULAR SOLUTION
20.0000 mg | Freq: Once | INTRAMUSCULAR | Status: AC
Start: 2021-12-26 — End: 2021-12-26
  Administered 2021-12-26: 20 mg via INTRAMUSCULAR

## 2021-12-26 MED ORDER — ZIPRASIDONE 20 MG/ML (FINAL CONCENTRATION) INTRAMUSCULAR SOLUTION
10.0000 mg | Freq: Once | INTRAMUSCULAR | Status: AC
Start: 2021-12-26 — End: 2021-12-26
  Administered 2021-12-26: 10 mg via INTRAMUSCULAR

## 2021-12-26 MED ORDER — HYDROXYZINE PAMOATE 25 MG CAPSULE
ORAL_CAPSULE | ORAL | Status: AC
Start: 2021-12-26 — End: 2021-12-26
  Filled 2021-12-26: qty 1

## 2021-12-26 MED ORDER — DIVALPROEX ER 500 MG TABLET,EXTENDED RELEASE 24 HR
750.0000 mg | ORAL_TABLET | Freq: Every day | ORAL | Status: DC
Start: 2021-12-26 — End: 2021-12-26
  Administered 2021-12-26 (×2): 750 mg via ORAL
  Filled 2021-12-26 (×2): qty 1

## 2021-12-26 MED ORDER — HYDROXYZINE PAMOATE 25 MG CAPSULE
25.0000 mg | ORAL_CAPSULE | ORAL | Status: AC
Start: 2021-12-26 — End: 2021-12-26
  Administered 2021-12-26: 25 mg via ORAL

## 2021-12-26 NOTE — ED Nurses Note (Signed)
Patient sleeping calmly and cooperatively in bed. Sitter remains at bedside.

## 2021-12-26 NOTE — ED Nurses Note (Signed)
Pt continues to pace back and forth in room, anxious behavior. Provider notified.

## 2021-12-26 NOTE — ED Nurses Note (Signed)
PT given snack tray and po fluids with warm blankets.

## 2021-12-26 NOTE — ED Nurses Note (Signed)
Pt resting in bed, eyes closed, resp even and unlabored, sitter placed outside pt door, camera on pt , will assess pt upon waking up. Will monitor.

## 2021-12-26 NOTE — ED Nurses Note (Signed)
Spoke with [facility] and faxed additional paperwork the needed to get Committal hearing in progress. States that it will be sometime tomorrow am before anyone could come to [facility] and evaluate pt.

## 2021-12-26 NOTE — ED Nurses Note (Signed)
Sharee Pimple and [employer] here to interview pt.

## 2021-12-26 NOTE — ED Nurses Note (Signed)
Pt transferred to [facility] facility at this time via [employer]. Pts belongings with police officer and paperwork sent.

## 2021-12-26 NOTE — ED Nurses Note (Addendum)
Pt is alert, oriented, cooperative and pleasant this morning, states he still feel suicidal, he mentions he has multiple plans to kill himself, hears voices saying to kill himself, agreeable to go to [facility], pt states he is also HI would like to hurt his family, states they are not good to me, breakfast biscuits x 2 given and several drinks, urinal output 1000 mls . Sitter outside of room, camera on pt. Will monitor.

## 2021-12-26 NOTE — ED Nurses Note (Signed)
Pt resting on stretcher with eyes closed, no s/s of distress. Video and close monitoring implemented. Will continue to monitor.

## 2021-12-26 NOTE — ED Nurses Note (Signed)
One on one psych sitter assigned to pt

## 2021-12-26 NOTE — ED Attending Handoff Note (Signed)
MDM:  Patient is high complexity due presentation comorbidities.  Does have schizophrenia, and bipolar disorder.  He states he is being compliant with all of his medications, however, they are not helping his symptoms.  Says he keeps hearing his voices telling him to kill himself, he also feels that he has nothing to live for.  He has been in [facility], and the [facility] in the past as well.  He says he has attempted suicide by buying and swallowing razor blades in the past as well.  He lives with a sister, who does have 1 firearm in the house that is not in a safe.      Examination this morning shows flat withdrawn affect, no acute distress, neck is supple, lungs are clear symmetrical, heart regular rate and rhythm without murmur, abdomen is soft and nontender.  Labs appear stable, did order a valproic acid level on him, waiting hearing for commitment later today.  ED Course as of 12/26/21 1735   Sun Dec 26, 2021   1441 Patient states that he supposedly swallowed something when he went to the bathroom, says he wants to talk to his law your before he discloses any further information.  Abdominal, KUB, ordered for the patient to assess for radio opaque foreign body ingestion.   1657 Patient's valproic acid level was low at 10, will need Depakote given.   1734 Patient did state that he swallowed an object and would not describe what it was, x-ray report shows no foreign body identified, which means no radiopaque objects.  As noted above he was pulling pieces of his paper gown often chewing and swallowing them, a large piece was removed prior to him being able to successfully swallow a small piece of paper material.  He is medically cleared.      Psych Facility  Clinical Impression   Schizophrenia, chronic with acute exacerbation (CMS HCC) (Primary)   Depression with suicidal ideation     Medications Administered in the ED   divalproex (DEPAKOTE ER) extended release tablet 750 mg (750 mg Oral Given 12/26/21 0200)    hydrOXYzine pamoate (VISTARIL) capsule (50 mg Oral Given 12/26/21 0129)   ziprasidone (GEODON) IM injection (20 mg IntraMUSCULAR Given 12/26/21 0245)        Current Discharge Medication List      CONTINUE these medications - NO CHANGES were made during your visit.      Details   benztropine 1 mg Tablet  Commonly known as: COGENTIN   1 mg, Oral, 2 TIMES DAILY  Refills: 0     divalproex 250 mg Tablet Sustained Release 24 hr  Commonly known as: DEPAKOTE ER   750 mg, Oral, 2 TIMES DAILY  Refills: 0     hydrOXYzine pamoate 50 mg Capsule  Commonly known as: VISTARIL   50 mg, Oral, 3 TIMES DAILY PRN  Refills: 0     lithium carbonate 300 mg Capsule  Commonly known as: ESKALITH   300 mg, Oral, DAILY  Refills: 0

## 2021-12-26 NOTE — ED Nurses Note (Signed)
Patient requested to use the restroom. Accompanied patient to the restroom. Accompanied patient back to room. Patient lying calmly and cooperatively in bed. Sitter at bedside.

## 2021-12-26 NOTE — ED Nurses Note (Signed)
Restraints taken off 1820. Pt given Geodon IM

## 2021-12-26 NOTE — ED Nurses Note (Signed)
Pt to bathroom with sitter outside of door, pt comes out door and tells sitter "I swallowed something in the bathroom and I am not telling you what"  pt taken back to room. All belongings taken from pt, pt searched again, and room searched and trashed taken out, oxygen tank taken from stretcher, pt back to room, pt biting self, ripped paper scrubs and tried to push down his throat, pt rested in room for a couple minutes, then bolted toward the door, staff after him, he walked back to room,   4 point restraints placed on pt, this pt was cooperative during placement, pt in restraints and after a couple minutes he broke out, restraints placed on tighter, dr Cristela Felt at bedside, KUB obtained since he swallowed unknown object.

## 2021-12-26 NOTE — ED Nurses Note (Signed)
Pt given lunch meal and drinks.

## 2021-12-26 NOTE — ED Nurses Note (Signed)
PT resting in bed with resp even and unlabored. Lights dimmed for pt comfort. PT still in direct view of nurses station and nursing staff.

## 2021-12-26 NOTE — ED Nurses Note (Signed)
Patient requesting a snack and po fluids. Primary nurse to accommodate.

## 2021-12-26 NOTE — ED Nurses Note (Incomplete)
Pt went to bathroom, sitter outside  bathroom door, pt came out and states " I swallowed  something in bathroom, and I am not telling you what". Pt brought back to room,

## 2021-12-26 NOTE — ED Nurses Note (Signed)
PT resting in room with resp even and unlabored. Calm at this time but has had to have police at bedside today because of behavior issues. Awaiting Transfer via [facility].

## 2022-02-01 ENCOUNTER — Other Ambulatory Visit: Payer: Self-pay

## 2022-02-01 ENCOUNTER — Encounter (HOSPITAL_COMMUNITY): Payer: Self-pay | Admitting: Nurse Practitioner

## 2022-02-01 ENCOUNTER — Emergency Department
Admission: EM | Admit: 2022-02-01 | Discharge: 2022-02-02 | Disposition: A | Discharge status: Other Acute Inpatient Hospital | Payer: Medicaid - Out of State | Attending: Nurse Practitioner | Admitting: Nurse Practitioner

## 2022-02-01 ENCOUNTER — Inpatient Hospital Stay (HOSPITAL_COMMUNITY): Payer: Medicaid - Out of State

## 2022-02-01 DIAGNOSIS — F319 Bipolar disorder, unspecified: Secondary | ICD-10-CM

## 2022-02-01 DIAGNOSIS — F209 Schizophrenia, unspecified: Secondary | ICD-10-CM | POA: Insufficient documentation

## 2022-02-01 DIAGNOSIS — R440 Auditory hallucinations: Secondary | ICD-10-CM

## 2022-02-01 DIAGNOSIS — Z9151 Personal history of suicidal behavior: Secondary | ICD-10-CM

## 2022-02-01 DIAGNOSIS — T1491XA Suicide attempt, initial encounter: Secondary | ICD-10-CM

## 2022-02-01 LAB — URINALYSIS, MACROSCOPIC
BILIRUBIN: NEGATIVE mg/dL
BLOOD: NEGATIVE mg/dL
GLUCOSE: NEGATIVE mg/dL
KETONES: NEGATIVE mg/dL
LEUKOCYTES: 25 WBCs/uL — AB
NITRITE: NEGATIVE
PH: 7 (ref 5.0–9.0)
PROTEIN: NEGATIVE mg/dL
SPECIFIC GRAVITY: 1.021 (ref 1.002–1.030)
UROBILINOGEN: NORMAL mg/dL

## 2022-02-01 LAB — SALICYLATE ACID LEVEL: SALICYLATE LEVEL: <3 mg/dL (ref <=30)

## 2022-02-01 LAB — CBC WITH DIFF
BASOPHIL #: 0 10*3/uL (ref 0.00–0.30)
BASOPHIL %: 0 % (ref 0–3)
EOSINOPHIL #: 0 10*3/uL (ref 0.00–0.80)
EOSINOPHIL %: 1 % (ref 0–7)
HCT: 43.1 % (ref 42.0–51.0)
HGB: 14.2 g/dL (ref 13.5–18.0)
LYMPHOCYTE #: 1.1 10*3/uL (ref 1.10–5.00)
LYMPHOCYTE %: 25 % (ref 25–45)
MCH: 28.1 pg (ref 27.0–32.0)
MCHC: 33.1 g/dL (ref 32.0–36.0)
MCV: 85 fL (ref 78.0–99.0)
MONOCYTE #: 0.3 10*3/uL (ref 0.00–1.30)
MONOCYTE %: 7 % (ref 0–12)
MPV: 8.1 fL (ref 7.4–10.4)
NEUTROPHIL #: 3 10*3/uL (ref 1.80–8.40)
NEUTROPHIL %: 68 % (ref 40–76)
PLATELETS: 236 10*3/uL (ref 140–440)
RBC: 5.07 10*6/uL (ref 4.20–6.00)
RDW: 13.9 % (ref 11.6–14.8)
WBC: 4.5 10*3/uL (ref 4.0–10.5)
WBCS UNCORRECTED: 4.5 10*3/uL

## 2022-02-01 LAB — BLUE TOP TUBE

## 2022-02-01 LAB — COMPREHENSIVE METABOLIC PANEL, NON-FASTING
ALBUMIN/GLOBULIN RATIO: 1.5 — ABNORMAL HIGH (ref 0.8–1.4)
ALBUMIN: 4.6 g/dL (ref 3.5–5.7)
ALKALINE PHOSPHATASE: 92 U/L (ref 34–104)
ALT (SGPT): 59 U/L — ABNORMAL HIGH (ref 7–52)
ANION GAP: 8 mmol/L (ref 4–13)
AST (SGOT): 32 U/L (ref 13–39)
BILIRUBIN TOTAL: 1 mg/dL (ref 0.3–1.2)
BUN/CREA RATIO: 18 (ref 6–22)
BUN: 14 mg/dL (ref 7–25)
CALCIUM, CORRECTED: 9 mg/dL (ref 8.9–10.8)
CALCIUM: 9.6 mg/dL (ref 8.6–10.3)
CHLORIDE: 107 mmol/L (ref 98–107)
CO2 TOTAL: 23 mmol/L (ref 21–31)
CREATININE: 0.78 mg/dL (ref 0.60–1.30)
ESTIMATED GFR: 119 mL/min/{1.73_m2} (ref >59)
GLOBULIN: 3 (ref 2.9–5.4)
GLUCOSE: 109 mg/dL (ref 74–109)
OSMOLALITY, CALCULATED: 277 mOsm/kg (ref 270–290)
POTASSIUM: 3.9 mmol/L (ref 3.5–5.1)
PROTEIN TOTAL: 7.6 g/dL (ref 6.4–8.9)
SODIUM: 138 mmol/L (ref 136–145)

## 2022-02-01 LAB — URINE DRUG SCREEN
AMPHET QL: NEGATIVE
BARB QL: NEGATIVE
BENZO QL: NEGATIVE
BUP QL: NEGATIVE
CANNAQL: NEGATIVE
COCQL: NEGATIVE
FENTANYL, RANDOM URINE: NEGATIVE
METHQL: NEGATIVE
OPIATE: NEGATIVE
OXYCODONE URINE: NEGATIVE
PCP QL: NEGATIVE

## 2022-02-01 LAB — URINALYSIS, MICROSCOPIC
RBCS: <1 /hpf (ref <4)
WBCS: 5 /hpf (ref <6)

## 2022-02-01 LAB — THYROID STIMULATING HORMONE WITH FREE T4 REFLEX: TSH: 0.85 u[IU]/mL (ref 0.450–5.330)

## 2022-02-01 LAB — ETHANOL, SERUM: ETHANOL: <10 mg/dL — ABNORMAL HIGH

## 2022-02-01 LAB — GOLD TOP TUBE

## 2022-02-01 LAB — ACETAMINOPHEN LEVEL: ACETAMINOPHEN LEVEL: <10 ug/mL — ABNORMAL LOW (ref 10–30)

## 2022-02-01 MED ORDER — LITHIUM CARBONATE 300 MG CAPSULE
300.0000 mg | ORAL_CAPSULE | Freq: Three times a day (TID) | ORAL | Status: DC
Start: 2022-02-02 — End: 2022-02-02
  Administered 2022-02-02 (×3): 300 mg via ORAL
  Filled 2022-02-01 (×7): qty 1

## 2022-02-01 MED ORDER — ACETAMINOPHEN 325 MG TABLET
650.0000 mg | ORAL_TABLET | ORAL | Status: AC
Start: 2022-02-01 — End: 2022-02-01
  Administered 2022-02-01: 650 mg via ORAL

## 2022-02-01 MED ORDER — BENZTROPINE 1 MG TABLET
ORAL_TABLET | ORAL | Status: AC
Start: 2022-02-01 — End: 2022-02-01
  Filled 2022-02-01: qty 1

## 2022-02-01 MED ORDER — ACETAMINOPHEN 325 MG TABLET
ORAL_TABLET | ORAL | Status: AC
Start: 2022-02-01 — End: 2022-02-01
  Filled 2022-02-01: qty 2

## 2022-02-01 MED ORDER — BENZTROPINE 1 MG TABLET
1.0000 mg | ORAL_TABLET | Freq: Two times a day (BID) | ORAL | Status: DC
Start: 2022-02-02 — End: 2022-02-02
  Administered 2022-02-01 – 2022-02-02 (×2): 1 mg via ORAL

## 2022-02-01 NOTE — ED Nurses Note (Signed)
Referral made to the crisis unit at this time.

## 2022-02-01 NOTE — ED Nurses Note (Signed)
Pt currently laying in bed. No s/s of distress noted. Asking for food and drink which was given. Pleasant demeanor. Asks if he can sign out AMA and was informed that it was not an option as he had s/i w/ a plan. No agitation at this time. Was telling sitter he did not like his place of residency, it was not 'safe'. 1:1 care being given at this time. Suicide prevention in place.

## 2022-02-01 NOTE — ED APP Handoff Note (Signed)
[facility]  Emergency Department  Handoff Note    Care/report received from Dr. Bonnetta Barry att. providers found at  23:40.  Per report:  Donald Atkins is a 36 y.o. male who had concerns including Suicidal Thoughts .     Pending labs/imaging/consults:  Medical clearance and disposition  Plan:  Patient medically cleared.  Needs higher level of care.  Mental hygiene file    Course:      Based on the history, physical exam, and tests obtained as indicated, the Emergency Department evaluation has identified no acute or active medical issues that warrant further emergency department intervention or precludes discharge of the patient in the company of law enforcement or transfer to a psychiatric facility.    Disposition: Psych Facility          Clinical Impression   Suicidal behavior with attempted self-injury (CMS HCC) (Primary)   Bipolar disorder with depression (CMS HCC)   Auditory hallucinations   Schizophrenia, unspecified type (CMS HCC)         Emi Holes, PA-C

## 2022-02-01 NOTE — ED APP Handoff Note (Signed)
[facility]  Emergency Department  Provider in Triage Note    Name: Donald Atkins  Age: 36 y.o.  Gender: male     Subjective:   Donald Atkins is a 36 y.o. male who presents with complaint of Suicidal Thoughts   .  Pt states he is suicidal with plan and intent. Pt states he has thoughts of running in front of car, hanging himself, or cutting his throat. Pt also swallowed half of a pencil today    Objective:   There were no vitals filed for this visit.   Focused Physical Exam shows S1 and S2 audible. VSS. PEARLA. Alert and oriented      Plan:  Please see initial orders and work-up below.  This is to be continued with full evaluation in the main Emergency Department.     No current facility-administered medications for this encounter.     Results for orders placed or performed during the hospital encounter of 02/01/22 (from the past 24 hour(s))   CBC/DIFF    Narrative    The following orders were created for panel order CBC/DIFF.  Procedure                               Abnormality         Status                     ---------                               -----------         ------                     CBC WITH DUKG[[id number]]                                                                 Please view results for these tests on the individual orders.   URINALYSIS, MACROSCOPIC AND MICROSCOPIC W/CULTURE REFLEX    Specimen: Urine, Site not specified    Narrative    The following orders were created for panel order URINALYSIS, MACROSCOPIC AND MICROSCOPIC W/CULTURE REFLEX.  Procedure                               Abnormality         Status                     ---------                               -----------         ------                     URINALYSIS, MACROSCOPIC[[id number]]  URINALYSIS, MICROSCOPIC[[id number]]                                                       Please view results for these tests on the individual orders.         Sidney Ace, FNP-C

## 2022-02-01 NOTE — ED Nurses Note (Signed)
[facility] called at this time for update and stated that he was denied and needed a higher level of care.

## 2022-02-01 NOTE — ED Nurses Note (Signed)
Referral to [facility] at this time.

## 2022-02-01 NOTE — ED Triage Notes (Addendum)
Suicidal thoughts with plan to hand himself, cut his throat or run in front of a car. Denies HI. Swallowed a half a pencil about 1.5 hours ago.

## 2022-02-01 NOTE — ED Provider Notes (Signed)
[facility]  ED Primary Provider Note  History of Present Illness   Chief Complaint   Patient presents with    Suicidal Thoughts      Donald Atkins is a 36 y.o. male who had concerns including Suicidal Thoughts .  Arrival: The patient arrived by Car      This 36 year old male with history of bipolar disorder and schizophrenia presents to the emergency department complaining of feeling depressed and suicidal with a plan of either to hang himself, run out in front of a car, or slicing his throat.  He reports that at 3:00 a.m. he became depressed and started hearing voices telling him to kill himself.  He reports history of a suicide attempt where he swallowed a plastic spoon while he was in the hospital.  He voices no physical complaints and states that he feels okay.  He denies chest pain or shortness of breath.  He denies abdominal pain.    Review of Systems   Pertinent positive and negative ROS as per HPI.  Historical Data   History Reviewed This Encounter: Medical History  Surgical History  Family History  Social History    Physical Exam   ED Triage Vitals [02/01/22 1309]   BP (Non-Invasive) (!) 143/92   Heart Rate 91   Respiratory Rate 16   Temperature 36.3 C (97.3 F)   SpO2 98 %   Weight 92.5 kg (204 lb)   Height 1.88 m (_0 )     Physical Exam  Vitals and nursing note reviewed.   Constitutional:       General: He is not in acute distress.     Appearance: Normal appearance. He is normal weight. He is not ill-appearing, toxic-appearing or diaphoretic.   HENT:      Head: Normocephalic and atraumatic.      Right Ear: Tympanic membrane, ear canal and external ear normal.      Left Ear: Tympanic membrane, ear canal and external ear normal.      Nose: Nose normal.      Mouth/Throat:      Mouth: Mucous membranes are moist.      Pharynx: Oropharynx is clear.   Eyes:      Extraocular Movements: Extraocular movements intact.      Conjunctiva/sclera: Conjunctivae normal.       Pupils: Pupils are equal, round, and reactive to light.   Cardiovascular:      Rate and Rhythm: Normal rate and regular rhythm.      Pulses: Normal pulses.      Heart sounds: Normal heart sounds.   Pulmonary:      Effort: Pulmonary effort is normal. No respiratory distress.      Breath sounds: Normal breath sounds. No stridor. No wheezing, rhonchi or rales.   Chest:      Chest wall: No tenderness.   Abdominal:      General: Abdomen is flat. Bowel sounds are normal. There is no distension.      Palpations: Abdomen is soft.      Tenderness: There is no abdominal tenderness.   Musculoskeletal:         General: No swelling or tenderness. Normal range of motion.      Cervical back: Normal range of motion and neck supple.   Skin:     General: Skin is warm and dry.      Capillary Refill: Capillary refill takes less than 2 seconds.      Coloration: Skin is  not jaundiced or pale.      Findings: No rash.   Neurological:      General: No focal deficit present.      Mental Status: He is alert and oriented to person, place, and time.      Cranial Nerves: No cranial nerve deficit.      Sensory: No sensory deficit.      Motor: No weakness.   Psychiatric:      Comments: Appears to be in a depressed mood.     Patient Data       Labs Ordered/Reviewed   ACETAMINOPHEN LEVEL - Abnormal; Notable for the following components:       Result Value    ACETAMINOPHEN LEVEL <10 (*)     All other components within normal limits   COMPREHENSIVE METABOLIC PANEL, NON-FASTING - Abnormal; Notable for the following components:    ALT (SGPT) 59 (*)     ALBUMIN/GLOBULIN RATIO 1.5 (*)     All other components within normal limits    Narrative:     Estimated Glomerular Filtration Rate (eGFR) is calculated using the CKD-EPI (2021) equation, intended for patients 6 years of age and older. If gender is not documented or "unknown", there will be no eGFR calculation.   ETHANOL, SERUM - Abnormal; Notable for the following components:    ETHANOL <10 (*)     All  other components within normal limits   URINALYSIS, MACROSCOPIC - Abnormal; Notable for the following components:    LEUKOCYTES 25 (*)     All other components within normal limits   URINALYSIS, MICROSCOPIC - Abnormal; Notable for the following components:    MUCOUS Rare (*)     All other components within normal limits   THYROID STIMULATING HORMONE WITH FREE T4 REFLEX - Normal   URINE DRUG SCREEN - Normal    Narrative:     Any results reported as "positive" on this urine drug screen are unconfirmed screening results and should be used for medical(i.e.,treatment)purposes only. Unconfirmed screening results must not be used for non-medical purposes (e.g. employment or legal testing). Upon request, all results reported as "positive" can be sent to a reference laboratory for confirmation by [place].     Reporting Limits (cut-off concentrations)     Cocaine 300 ng/mL  Opiates 300 ng/mL  THC 50 ng/mL  Amphetamine 1000 ng/mL  Phencyclidine 25 ng/mL  Benzodiazepine 300 ng/mL  Barbiturates 300 ng/mL  Methadone 300 ng/mL  Oxycodone 100 ng/mL  Buprenorphine 5 ng/mL  Fentanyl 5 ng/mL     URINE CULTURE,ROUTINE   CBC/DIFF    Narrative:     The following orders were created for panel order CBC/DIFF.  Procedure                               Abnormality         Status                     ---------                               -----------         ------                     CBC WITH NWGN[[id number]]  Final result                 Please view results for these tests on the individual orders.   SALICYLATE ACID LEVEL   URINALYSIS, MACROSCOPIC AND MICROSCOPIC W/CULTURE REFLEX    Narrative:     The following orders were created for panel order URINALYSIS, MACROSCOPIC AND MICROSCOPIC W/CULTURE REFLEX.  Procedure                               Abnormality         Status                     ---------                               -----------         ------                     URINALYSIS, MACROSCOPIC[[id number]]       Abnormal            Final result               URINALYSIS, MICROSCOPIC[[id number]]      Abnormal            Final result                 Please view results for these tests on the individual orders.   CBC WITH DIFF   BLUE TOP TUBE   EXTRA TUBES    Narrative:     The following orders were created for panel order EXTRA TUBES.  Procedure                               Abnormality         Status                     ---------                               -----------         ------                     BLUE TOP QION[[id number]]                                    Final result               GOLD TOP KGMW[[id number]]                                    In process                   Please view results for these tests on the individual orders.   GOLD TOP TUBE     XR KUB AND UPRIGHT ABDOMEN   Final Result by Edi, [facility] In (08/22 1331)   NEGATIVE TWO VIEWS OF THE ABDOMEN.            Radiologist location ID: [id number]  Medical Decision Making          Medical Decision Making  The patient presents to the emergency department with depression, suicidal ideations with a plan, and hallucinations telling him to kill himself.  His blood work was essentially normal.  The abdominal x-ray was normal showing no foreign body.  He has multiple plans to kill himself including hanging himself, cutting his throat, and running out in front of a car.  With his depression and suicidal ideations with a plan he will need to be admitted to a [facility] facility for further evaluation and treatment.    Problems Addressed:  Auditory hallucinations: acute illness or injury  Bipolar disorder with depression (CMS [place]): chronic illness or injury  Schizophrenia, unspecified type (CMS [place]): chronic illness or injury  Suicidal behavior with attempted self-injury (CMS [facility]): acute illness or injury    Amount and/or Complexity of Data Reviewed  Labs: ordered. Decision-making details documented in ED Course.     Details: CBC essentially  normal with a WBC of 4.5.  CMP essentially normal.  Salicylate level less than 3.  TSH 0.850.  Acetaminophen level less than 10.  Urinalysis negative for urinary tract infection.  Urine drug screen negative for all drugs tested.  Radiology: ordered and independent interpretation performed. Decision-making details documented in ED Course.     Details: X-ray KUB and upright abdomen shows no acute findings as per the radiology report.    Risk  OTC drugs.      ED Course as of 02/01/22 1755   Tue Feb 01, 2022   1408 CBC essentially normal with a WBC is 4.5.   1510 X-ray KUB and upright abdomen shows no acute findings as per the radiology report.   1510 CMP essentially normal.   8325 Salicylate level less than 3.   1512 TSH 0.850   1512 Acetaminophen level less than 10.   1538 Urinalysis negative for urinary tract infection.   1547 Urine drug screen negative for all drugs tested.   1547 The patient is alert at this time and in no acute distress.  He is medically [facility]   1754 Reported off to [facility] awaiting bed availability at the [facility].         Risk Factors:    Bipolar Disorder  Protective Factors:    social supports  Suicide Inquiry:    Thoughts:   ideation with a plan--to hang himself, run out in front of a car, slice his throat.    Plans/Behaviors/Past attempts:   threat of hanging self  Intent:   Patient expressed intent to harm self with definite plan.    Risk Level:       Based on my assessment, the patient is a high risk of suicide     Intervention:     HIGH RISK: Patient has been found to be [place] for suicide.  Admission generally indicated unless a significant change reduces risk. Suicide precautions initiated including 1:1 observation until transferred to psychiatric unit/facility or risk is downgraded, removal of potential lethal objects and personal belongings from the room until transfer, and other measures advised by  psych.    Documentation  Given the above risk level the following plan of care was developed:    Admission is required due to depression with suicidal ideations and plans to kill himself.    1:1 observation was required due to prevent the patient from harming himself.  Medication management - unchanged.   Behavioral health referral was offered and was accepted by the patient.  Contact with supportive persons in their life was encouraged. Patient identifies no one as a person of trust and support.  Patient counseled by provider on Suicide prevention and provided with written information, crisis phone numbers, and resources.  [facility] follow up is [facility]      Medications Administered in the ED   acetaminophen (TYLENOL) tablet (has no administration in time range)     Clinical Impression   Suicidal behavior with attempted self-injury (CMS HCC) (Primary)   Bipolar disorder with depression (CMS HCC)   Auditory hallucinations   Schizophrenia, unspecified type (CMS [place])       Disposition: [facility]

## 2022-02-01 NOTE — ED Nurses Note (Signed)
Patient declined to Crisis unit.

## 2022-02-02 MED ORDER — ACETAMINOPHEN 325 MG TABLET
650.0000 mg | ORAL_TABLET | ORAL | Status: DC
Start: 2022-02-02 — End: 2022-02-02
  Administered 2022-02-02: 0 mg via ORAL

## 2022-02-02 MED ORDER — BENZTROPINE 1 MG TABLET
ORAL_TABLET | ORAL | Status: AC
Start: 2022-02-02 — End: 2022-02-02
  Filled 2022-02-02: qty 1

## 2022-02-02 MED ORDER — ZIPRASIDONE 20 MG/ML (FINAL CONCENTRATION) INTRAMUSCULAR SOLUTION
20.0000 mg | Freq: Once | INTRAMUSCULAR | Status: AC
Start: 2022-02-02 — End: 2022-02-02
  Administered 2022-02-02: 20 mg via INTRAMUSCULAR

## 2022-02-02 MED ORDER — HYDROXYZINE PAMOATE 25 MG CAPSULE
50.0000 mg | ORAL_CAPSULE | ORAL | Status: AC
Start: 2022-02-02 — End: 2022-02-02
  Administered 2022-02-02: 50 mg via ORAL

## 2022-02-02 MED ORDER — ACETAMINOPHEN 325 MG TABLET
ORAL_TABLET | ORAL | Status: AC
Start: 2022-02-02 — End: 2022-02-02
  Filled 2022-02-02: qty 2

## 2022-02-02 MED ORDER — ZIPRASIDONE 20 MG/ML (FINAL CONCENTRATION) INTRAMUSCULAR SOLUTION
INTRAMUSCULAR | Status: AC
Start: 2022-02-02 — End: 2022-02-02
  Filled 2022-02-02: qty 1

## 2022-02-02 MED ORDER — ACETAMINOPHEN 325 MG TABLET
650.0000 mg | ORAL_TABLET | ORAL | Status: AC
Start: 2022-02-02 — End: 2022-02-02
  Administered 2022-02-02: 650 mg via ORAL

## 2022-02-02 MED ORDER — HYDROXYZINE PAMOATE 25 MG CAPSULE
ORAL_CAPSULE | ORAL | Status: AC
Start: 2022-02-02 — End: 2022-02-02
  Filled 2022-02-02: qty 2

## 2022-02-02 NOTE — ED Nurses Note (Signed)
PATIENT REQUESTING COFFEE AT THIS TIME. COFFEE PROVIDED TO PATIENT.

## 2022-02-02 NOTE — ED Nurses Note (Signed)
PT increased agitation from beginning of shift. Around 19:00 sitter asked for a Band-Aid because pt scraped his finger. Brought Band-Aid to pt. Pt stated "I'm good now I could have used it 30 mins ago" Explained to pt that I had pressing matters to tend to. PT told me "Go fuck yourself, she is going to make me slit my throat." At that time security was called, monitor tech also came to room to assist taking items out. Pt states that we threatened to kill him, nothing of that nature was said. Pt was asked to remove himself from the doorway and sit on his stretcher so we can remove the items from the room. Pt accused this nurse and monitor tech having an affair. Pt was able to calm down. Was given a verbal order for Geodon. Explained to pt if he can remain calm that is our goal. If he would like the medication he can have it. Pt declined medication at this time. Pt spoke with monitor tech, calmed down and requested the Geodon after his hearing is held.

## 2022-02-03 LAB — URINE CULTURE,ROUTINE: URINE CULTURE: NO GROWTH

## 2022-03-18 ENCOUNTER — Other Ambulatory Visit: Payer: Self-pay

## 2022-03-18 ENCOUNTER — Emergency Department
Admission: EM | Admit: 2022-03-18 | Discharge: 2022-03-19 | Disposition: A | Discharge status: Other Acute Inpatient Hospital | Payer: Medicaid Other | Attending: NURSE PRACTITIONER | Admitting: NURSE PRACTITIONER

## 2022-03-18 DIAGNOSIS — F329 Major depressive disorder, single episode, unspecified: Secondary | ICD-10-CM

## 2022-03-18 DIAGNOSIS — F209 Schizophrenia, unspecified: Secondary | ICD-10-CM | POA: Insufficient documentation

## 2022-03-18 DIAGNOSIS — U071 COVID-19: Secondary | ICD-10-CM | POA: Insufficient documentation

## 2022-03-18 DIAGNOSIS — F319 Bipolar disorder, unspecified: Secondary | ICD-10-CM | POA: Insufficient documentation

## 2022-03-18 DIAGNOSIS — Z9151 Personal history of suicidal behavior: Secondary | ICD-10-CM | POA: Insufficient documentation

## 2022-03-18 DIAGNOSIS — R45851 Suicidal ideations: Secondary | ICD-10-CM | POA: Insufficient documentation

## 2022-03-18 DIAGNOSIS — Z008 Encounter for other general examination: Secondary | ICD-10-CM

## 2022-03-18 LAB — URINALYSIS, MACROSCOPIC
BILIRUBIN: NEGATIVE mg/dL
BLOOD: NEGATIVE mg/dL
GLUCOSE: NEGATIVE mg/dL
KETONES: NEGATIVE mg/dL
LEUKOCYTES: NEGATIVE WBCs/uL
NITRITE: NEGATIVE
PH: 6.5 (ref 5.0–9.0)
PROTEIN: NEGATIVE mg/dL
SPECIFIC GRAVITY: 1.015 (ref 1.002–1.030)
UROBILINOGEN: NORMAL mg/dL

## 2022-03-18 LAB — SALICYLATE ACID LEVEL: SALICYLATE LEVEL: <3 mg/dL (ref <=30)

## 2022-03-18 LAB — CBC WITH DIFF
BASOPHIL #: 0 10*3/uL (ref 0.00–0.10)
BASOPHIL %: 1 % (ref 0–1)
EOSINOPHIL #: 0 10*3/uL (ref 0.00–0.50)
EOSINOPHIL %: 0 % — ABNORMAL LOW
HCT: 42 % (ref 36.7–47.1)
HGB: 14.3 g/dL (ref 12.5–16.3)
LYMPHOCYTE #: 2.3 10*3/uL (ref 1.00–3.00)
LYMPHOCYTE %: 35 % (ref 16–44)
MCH: 28.7 pg (ref 23.8–33.4)
MCHC: 34 g/dL (ref 32.5–36.3)
MCV: 84.5 fL (ref 73.0–96.2)
MONOCYTE #: 0.3 10*3/uL (ref 0.30–1.00)
MONOCYTE %: 4 % — ABNORMAL LOW (ref 5–13)
MPV: 7.7 fL (ref 7.4–11.4)
NEUTROPHIL #: 3.9 10*3/uL (ref 1.85–7.80)
NEUTROPHIL %: 60 % (ref 43–77)
PLATELETS: 272 10*3/uL (ref 140–440)
RBC: 4.98 10*6/uL (ref 4.06–5.63)
RDW: 13.6 % (ref 12.1–16.2)
WBC: 6.5 10*3/uL (ref 3.6–10.2)

## 2022-03-18 LAB — COMPREHENSIVE METABOLIC PANEL, NON-FASTING
ALBUMIN/GLOBULIN RATIO: 1.6 — ABNORMAL HIGH (ref 0.8–1.4)
ALBUMIN: 4.7 g/dL (ref 3.5–5.7)
ALKALINE PHOSPHATASE: 83 U/L (ref 34–104)
ALT (SGPT): 42 U/L (ref 7–52)
ANION GAP: 6 mmol/L (ref 4–13)
AST (SGOT): 24 U/L (ref 13–39)
BILIRUBIN TOTAL: 0.5 mg/dL (ref 0.3–1.2)
BUN/CREA RATIO: 14 (ref 6–22)
BUN: 13 mg/dL (ref 7–25)
CALCIUM, CORRECTED: 9.1 mg/dL (ref 8.9–10.8)
CALCIUM: 9.8 mg/dL (ref 8.6–10.3)
CHLORIDE: 103 mmol/L (ref 98–107)
CO2 TOTAL: 31 mmol/L (ref 21–31)
CREATININE: 0.92 mg/dL (ref 0.60–1.30)
ESTIMATED GFR: 111 mL/min/{1.73_m2} (ref >59)
GLOBULIN: 2.9 (ref 2.9–5.4)
GLUCOSE: 118 mg/dL — ABNORMAL HIGH (ref 74–109)
OSMOLALITY, CALCULATED: 281 mOsm/kg (ref 270–290)
POTASSIUM: 3.9 mmol/L (ref 3.5–5.1)
PROTEIN TOTAL: 7.6 g/dL (ref 6.4–8.9)
SODIUM: 140 mmol/L (ref 136–145)

## 2022-03-18 LAB — COVID-19, FLU A/B, RSV RAPID BY PCR
INFLUENZA VIRUS TYPE A: NOT DETECTED
INFLUENZA VIRUS TYPE B: NOT DETECTED
RESPIRATORY SYNCTIAL VIRUS (RSV): NOT DETECTED
SARS-CoV-2: NOT DETECTED

## 2022-03-18 LAB — URINE DRUG SCREEN
AMPHET QL: NEGATIVE
BARB QL: NEGATIVE
BENZO QL: NEGATIVE
BUP QL: NEGATIVE
CANNAQL: NEGATIVE
COCQL: NEGATIVE
FENTANYL, RANDOM URINE: NEGATIVE
OPIATE: NEGATIVE
OXYCODONE URINE: NEGATIVE
PCP QL: NEGATIVE

## 2022-03-18 LAB — ACETAMINOPHEN LEVEL: ACETAMINOPHEN LEVEL: <10 ug/mL — ABNORMAL LOW (ref 10–30)

## 2022-03-18 LAB — ETHANOL, SERUM/PLASMA: ETHANOL: <10 mg/dL — ABNORMAL HIGH

## 2022-03-18 LAB — URINALYSIS, MICROSCOPIC
RBCS: <1 /hpf (ref <4)
SQUAMOUS EPITHELIAL: <1 /hpf (ref <28)
WBCS: <1 /hpf (ref <6)

## 2022-03-18 LAB — THYROID STIMULATING HORMONE WITH FREE T4 REFLEX: TSH: 0.926 u[IU]/mL (ref 0.450–5.330)

## 2022-03-18 NOTE — ED Provider Notes (Signed)
[facility]  Emergency Department  Advanced Practice Provider Note      CHIEF COMPLAINT  Chief Complaint   Patient presents with    Medical Screening     HISTORY OF PRESENT ILLNESS  Donald Atkins, date of birth December 08, 1985, is a 36 y.o. male who presented to the Emergency Department.    Patient here requesting medical clearance for psychiatric admission.  Patient does have a IT trainer from [facility] at bedside.  Caseworker reports that she has filed a Glass blower/designer for patient and he has been accepted to [facility].  Patient was recently hospitalized [facility].  [facility].  Patient does have an extensive mental health history including schizophrenia, bipolar and anxiety.  Patient denies any substance abuse, alcohol abuse or tobacco use.  Patient does admit to suicidal ideations with plans to "swallow something" patient admits to previous suicide attempts.  Denies any HI.  Reports he tested positive for COVID 2 days ago but he is asymptomatic.  Patient states he is unsure if the COVID test is accurate because "she didn't even go in my nose, just the outside"       Risk Factors:    Depression, sadness, Hopeless, Impulsivity, Major depression, and Schizophrenia  Protective Factors:    motivated for treatment  Suicide Inquiry:    Thoughts:   ideation with a plan--swallow "foreign objects" or use a weapon  Plans/Behaviors/Past attempts:   Intent:   Patient expressed intent to harm self without definite plan.    Risk Level:     Based on my assessment, the patient is a high risk of suicide     Intervention:     HIGH RISK: Patient has been found to be HIGH RISK for suicide.  Admission generally indicated unless a significant change reduces risk. Suicide precautions initiated including 1:1 observation until transferred to psychiatric unit/facility or risk is downgraded, removal of potential lethal objects and personal belongings from the room until  transfer, and other measures advised by psych.    Documentation  Given the above risk level the following plan of care was developed:    Admission is required due to extensive mental health history   1:1 observation was required  Medication management - unchanged.   Behavioral health referral was offered and was accepted by the patient.  Patient counseled by provider on Suicide prevention and provided with written information, crisis phone numbers, and resources.      PAST MEDICAL/SURGICAL/FAMILY/SOCIAL HISTORY  Past Medical History:   Diagnosis Date    Bipolar disorder, unspecified (CMS HCC)     Convulsions (CMS HCC)     GERD (gastroesophageal reflux disease)     Mixed hyperlipidemia     Schizophrenia (CMS HCC)            Family Medical History:    None       Social History     Socioeconomic History    Marital status: Unknown   Tobacco Use    Smoking status: Never     Passive exposure: Never    Smokeless tobacco: Never   Vaping Use    Vaping Use: Never used   Substance and Sexual Activity    Alcohol use: Never    Drug use: Never    Sexual activity: Not Currently      ALLERGIES  Allergies   Allergen Reactions    Latex     Penicillins Anaphylaxis       PHYSICAL  EXAM  VITAL SIGNS:  Filed Vitals:    03/18/22 1826   BP: (!) 143/94   Pulse: 71   Resp: 20   Temp: 36.6 C (97.8 F)   SpO2: 99%     Constitutional: Alert and oriented. Well appearing. Average body weight. No distress noted.   HEENT:   Head: Normocephalic and atraumatic.   Mouth/Throat: Oropharynx pink and moist. No postnasal drainage, erythema or exudates.   Eyes: EOMI, PERRL   Ears: External ear normal, no drainage noted  Nose:External nose normal, no drainage  Neck: Trachea midline. Neck supple.  Cardiovascular: RRR, S1, S2. Intact distal pulses.  Pulmonary/Chest: Breath sounds clear and equal bilaterally. No respiratory distress. No wheezes, rales or chest tenderness.             Musculoskeletal: No edema, tenderness or deformity. Normal muscle tone and  strength.   Skin: warm and dry. No rash  Psychiatric: normal mood and affect. Behavior is normal.   Neurological: Alert, oriented. Normal gait. No focal weakness noted.     Nursing notes reviewed.         DIAGNOSTICS  Labs:  Labs listed below were reviewed and interpreted by me.  No results found for any visits on 03/18/22.  Radiology:       ED [place]      ED Course as of 03/18/22 2055   Fri Mar 18, 2022   2055 COVID-19, FLU A/B, RSV RAPID BY PCR      Medical Decision Making  Patient here requesting medical clearance for psychiatric admission.  Patient does have a Product/process development scientist from [facility] at bedside.  Caseworker reports that she has filed a Firefighter for patient and he has been accepted to [facility].  Patient was recently hospitalized [facility].  Patient was hospitalized at the mental health [place] in April.  Patient does have an extensive mental health history including schizophrenia, bipolar and anxiety.  Patient denies any substance abuse, alcohol abuse or tobacco use.  Patient does admit to suicidal ideations with plans to "swallow something" patient admits to previous suicide attempts.  Denies any HI.  Reports he tested positive for COVID 2 days ago but he is asymptomatic.  Patient states he is unsure if the COVID test is accurate because "she didn't even go in my nose, just the outside"     Patient's list of differential diagnosis includes but is not limited to psychosis, schizophrenia, bipolar, anxiety, depression, suicidal ideation, suicide attempt    Care endorsed to Lonia Mad, NP    Amount and/or Complexity of Data Reviewed  ECG/medicine tests: independent interpretation performed.      CLINICAL IMPRESSION  Clinical Impression   Medical clearance for psychiatric admission (Primary)   Suicidal thoughts   Major depressive disorder     [place]  Current Discharge Medication List          Hewitt Shorts, FNP-C 03/18/2022, 18:40    [facility]  Department of Emergency [place]    This note was partially generated using MModal Fluency Direct system, and there may be some incorrect words, spellings, and punctuation that were not noted in checking the note before saving.    -----

## 2022-03-18 NOTE — ED Triage Notes (Signed)
Pt arrives to ED with SI with plan to "slit throat, swallow something, or walk into traffic." Reports he has been having the SI for about three days. Endorses A/V hallucinations. [place] worker is with patient and states "he can go to [facility] once medically cleared."

## 2022-03-18 NOTE — ED APP Handoff Note (Cosign Needed)
[facility]  Emergency Department  Provider in Triage Note    Name: Donald Atkins  Age: 36 y.o.  Gender: male     Subjective:   Donald Atkins is a 36 y.o. male who presents with complaint of Medical Screening  .  Pt to ED for medical clearance to be placed d/t SI with intent to walk into traffic, swallow an object, or cut his throat. Pt has hx of SI In the past. Pt has auditory hallucinations. Pt is currently COVID positive. Pt  denies SOB or chest pain    Objective:   Filed Vitals:    03/18/22 1826   Pulse: 71   Resp: 20   Temp: 36.6 C (97.8 F)   SpO2: 99%       Plan:  Please see initial orders and work-up below.  This is to be continued with full evaluation in the main Emergency Department.     No current facility-administered medications for this encounter.     Results for orders placed or performed during the hospital encounter of 03/18/22 (from the past 24 hour(s))   CBC/DIFF    Narrative    The following orders were created for panel order CBC/DIFF.  Procedure                               Abnormality         Status                     ---------                               -----------         ------                     CBC WITH EXHB[[id number]]                                                                 Please view results for these tests on the individual orders.   URINALYSIS, MACROSCOPIC AND MICROSCOPIC W/CULTURE REFLEX    Specimen: Urine, Site not specified    Narrative    The following orders were created for panel order URINALYSIS, MACROSCOPIC AND MICROSCOPIC W/CULTURE REFLEX.  Procedure                               Abnormality         Status                     ---------                               -----------         ------                     URINALYSIS, MACROSCOPIC[[id number]]  URINALYSIS, MICROSCOPIC[[id number]]                                                       Please view results for these tests on  the individual orders.             Sidney Ace, FNP-C

## 2022-03-19 LAB — METHADONE, RANDOM URINE: METHADONE URINE: NEGATIVE

## 2022-03-19 NOTE — ED Nurses Note (Signed)
Pt transported to [facility] by [facility] department. PT belongings given to officer.Marland Kitchen

## 2022-05-16 ENCOUNTER — Emergency Department
Admission: EM | Admit: 2022-05-16 | Discharge: 2022-05-16 | Discharge status: Against Medical Advice | Payer: Medicaid Other | Attending: Family | Admitting: Family

## 2022-05-16 ENCOUNTER — Emergency Department (EMERGENCY_DEPARTMENT_HOSPITAL)
Admission: EM | Admit: 2022-05-16 | Discharge: 2022-05-16 | Disposition: A | Discharge status: Other Acute Inpatient Hospital | Payer: Medicaid Other | Source: Home / Self Care

## 2022-05-16 ENCOUNTER — Telehealth (HOSPITAL_BASED_OUTPATIENT_CLINIC_OR_DEPARTMENT_OTHER): Payer: Self-pay

## 2022-05-16 ENCOUNTER — Encounter (HOSPITAL_PSYCHIATRIC): Payer: Self-pay | Admitting: Psychiatry

## 2022-05-16 ENCOUNTER — Encounter (HOSPITAL_BASED_OUTPATIENT_CLINIC_OR_DEPARTMENT_OTHER): Payer: Self-pay

## 2022-05-16 ENCOUNTER — Other Ambulatory Visit: Payer: Self-pay

## 2022-05-16 ENCOUNTER — Inpatient Hospital Stay
Admission: RE | Admit: 2022-05-16 | Discharge: 2022-05-18 | DRG: 885 | Disposition: A | Discharge status: Other Acute Inpatient Hospital | Payer: Medicaid Other | Source: Other Acute Inpatient Hospital | Attending: Psychiatry | Admitting: Psychiatry

## 2022-05-16 ENCOUNTER — Encounter (HOSPITAL_COMMUNITY): Payer: Self-pay

## 2022-05-16 DIAGNOSIS — Z1152 Encounter for screening for COVID-19: Secondary | ICD-10-CM | POA: Insufficient documentation

## 2022-05-16 DIAGNOSIS — R443 Hallucinations, unspecified: Secondary | ICD-10-CM | POA: Insufficient documentation

## 2022-05-16 DIAGNOSIS — R45851 Suicidal ideations: Secondary | ICD-10-CM | POA: Insufficient documentation

## 2022-05-16 DIAGNOSIS — R4589 Other symptoms and signs involving emotional state: Secondary | ICD-10-CM

## 2022-05-16 DIAGNOSIS — E782 Mixed hyperlipidemia: Secondary | ICD-10-CM | POA: Diagnosis present

## 2022-05-16 DIAGNOSIS — G4700 Insomnia, unspecified: Secondary | ICD-10-CM | POA: Diagnosis present

## 2022-05-16 DIAGNOSIS — F259 Schizoaffective disorder, unspecified: Principal | ICD-10-CM | POA: Diagnosis present

## 2022-05-16 DIAGNOSIS — F411 Generalized anxiety disorder: Secondary | ICD-10-CM | POA: Diagnosis present

## 2022-05-16 DIAGNOSIS — W449XXA Unspecified foreign body entering into or through a natural orifice, initial encounter: Secondary | ICD-10-CM | POA: Diagnosis not present

## 2022-05-16 DIAGNOSIS — Z5321 Procedure and treatment not carried out due to patient leaving prior to being seen by health care provider: Secondary | ICD-10-CM | POA: Insufficient documentation

## 2022-05-16 DIAGNOSIS — T189XXA Foreign body of alimentary tract, part unspecified, initial encounter: Secondary | ICD-10-CM | POA: Diagnosis not present

## 2022-05-16 DIAGNOSIS — Z79899 Other long term (current) drug therapy: Secondary | ICD-10-CM

## 2022-05-16 DIAGNOSIS — Z9152 Personal history of nonsuicidal self-harm: Secondary | ICD-10-CM

## 2022-05-16 HISTORY — DX: Borderline intellectual functioning: R41.83

## 2022-05-16 HISTORY — DX: Schizoaffective disorder, unspecified: F25.9

## 2022-05-16 HISTORY — DX: Generalized anxiety disorder: F41.1

## 2022-05-16 LAB — VALPROIC ACID LEVEL: VALPROIC ACID LEVEL: <10 ug/mL — ABNORMAL LOW (ref 50.0–100.0)

## 2022-05-16 LAB — CBC WITH DIFF
BASOPHIL #: 0.02 10*3/uL (ref 0.00–0.30)
BASOPHIL %: 0 % (ref 0–3)
EOSINOPHIL #: 0.04 10*3/uL (ref 0.00–0.80)
EOSINOPHIL %: 1 % (ref 0–7)
HCT: 40.7 % — ABNORMAL LOW (ref 42.0–51.0)
HGB: 14 g/dL (ref 13.5–18.0)
LYMPHOCYTE #: 1.31 10*3/uL (ref 1.10–5.00)
LYMPHOCYTE %: 30 % (ref 25–45)
MCH: 29.5 pg (ref 27.0–32.0)
MCHC: 34.3 g/dL (ref 32.0–36.0)
MCV: 86 fL (ref 78.0–99.0)
MONOCYTE #: 0.34 10*3/uL (ref 0.00–1.30)
MONOCYTE %: 8 % (ref 0–12)
MPV: 8.2 fL (ref 7.4–10.4)
NEUTROPHIL #: 2.71 10*3/uL (ref 1.80–8.40)
NEUTROPHIL %: 61 % (ref 40–76)
PLATELETS: 244 10*3/uL (ref 140–440)
RBC: 4.73 10*6/uL (ref 4.20–6.00)
RDW: 16.4 % — ABNORMAL HIGH (ref 11.6–14.8)
WBC: 4.4 10*3/uL (ref 4.0–10.5)

## 2022-05-16 LAB — URINALYSIS, MACRO/MICRO
BILIRUBIN: NEGATIVE mg/dL
BLOOD: NEGATIVE mg/dL
GLUCOSE: NEGATIVE mg/dL
KETONES: NEGATIVE mg/dL
LEUKOCYTES: NEGATIVE WBCs/uL
NITRITE: NEGATIVE
PH: 6 (ref 4.6–8.0)
PROTEIN: NEGATIVE mg/dL
SPECIFIC GRAVITY: 1.01 (ref 1.003–1.035)
UROBILINOGEN: 0.2 mg/dL (ref 0.2–1.0)

## 2022-05-16 LAB — COVID-19 ~~LOC~~ MOLECULAR LAB TESTING
INFLUENZA VIRUS TYPE A: NOT DETECTED
INFLUENZA VIRUS TYPE B: NOT DETECTED
RESPIRATORY SYNCTIAL VIRUS (RSV): NOT DETECTED
SARS-CoV-2: NOT DETECTED

## 2022-05-16 LAB — COMPREHENSIVE METABOLIC PANEL, NON-FASTING
ALBUMIN/GLOBULIN RATIO: 1.2 (ref 0.8–1.4)
ALBUMIN: 3.9 g/dL (ref 3.4–5.0)
ALKALINE PHOSPHATASE: 92 U/L (ref 46–116)
ALT (SGPT): 77 U/L (ref <=78)
ANION GAP: 9 mmol/L (ref 4–13)
AST (SGOT): 36 U/L (ref 15–37)
BILIRUBIN TOTAL: 0.5 mg/dL (ref 0.2–1.0)
BUN/CREA RATIO: 8
BUN: 8 mg/dL (ref 7–18)
CALCIUM, CORRECTED: 9.3 mg/dL
CALCIUM: 9.2 mg/dL (ref 8.5–10.1)
CHLORIDE: 104 mmol/L (ref 98–107)
CO2 TOTAL: 26 mmol/L (ref 21–32)
CREATININE: 0.95 mg/dL (ref 0.70–1.30)
ESTIMATED GFR: 107 mL/min/{1.73_m2} (ref >59)
GLOBULIN: 3.3
GLUCOSE: 95 mg/dL (ref 74–106)
OSMOLALITY, CALCULATED: 276 mOsm/kg (ref 270–290)
POTASSIUM: 3.8 mmol/L (ref 3.5–5.1)
PROTEIN TOTAL: 7.2 g/dL (ref 6.4–8.2)
SODIUM: 139 mmol/L (ref 136–145)

## 2022-05-16 LAB — ACETAMINOPHEN LEVEL: ACETAMINOPHEN LEVEL: 0

## 2022-05-16 LAB — ETHANOL, SERUM/PLASMA: ETHANOL: <3 mg/dL (ref <=3)

## 2022-05-16 LAB — URINE DRUG SCREEN
AMPHETAMINES URINE: NEGATIVE
BARBITURATES URINE: NEGATIVE
BENZODIAZEPINES URINE: NEGATIVE
CANNABINOIDS URINE: NEGATIVE
COCAINE METABOLITES URINE: NEGATIVE
METHADONE URINE: NEGATIVE
OPIATES URINE: NEGATIVE
PCP URINE: NEGATIVE

## 2022-05-16 LAB — SALICYLATE ACID LEVEL: SALICYLATE LEVEL: 0 mg/dL — ABNORMAL LOW (ref 3–20)

## 2022-05-16 LAB — LITHIUM LEVEL: LITHIUM LEVEL: <0.1 mmol/L — ABNORMAL LOW (ref 1.00–1.20)

## 2022-05-16 MED ORDER — MAGNESIUM HYDROXIDE 400 MG/5 ML ORAL SUSPENSION
30.0000 mL | Freq: Every evening | ORAL | Status: DC | PRN
Start: 2022-05-16 — End: 2022-05-18

## 2022-05-16 MED ORDER — LORAZEPAM 0.5 MG TABLET
ORAL_TABLET | ORAL | Status: AC
Start: 2022-05-16 — End: 2022-05-16
  Filled 2022-05-16: qty 1

## 2022-05-16 MED ORDER — ACETAMINOPHEN 325 MG TABLET
975.0000 mg | ORAL_TABLET | ORAL | Status: AC
Start: 2022-05-16 — End: 2022-05-16
  Administered 2022-05-16: 975 mg via ORAL

## 2022-05-16 MED ORDER — ZIPRASIDONE 20 MG/ML (FINAL CONCENTRATION) INTRAMUSCULAR SOLUTION
10.0000 mg | Freq: Once | INTRAMUSCULAR | Status: DC
Start: 2022-05-16 — End: 2022-05-16
  Administered 2022-05-16: 0 mg via INTRAMUSCULAR

## 2022-05-16 MED ORDER — TRAZODONE 50 MG TABLET
50.0000 mg | ORAL_TABLET | Freq: Every evening | ORAL | Status: DC | PRN
Start: 2022-05-16 — End: 2022-05-18
  Administered 2022-05-16: 50 mg via ORAL
  Filled 2022-05-16 (×2): qty 1

## 2022-05-16 MED ORDER — ACETAMINOPHEN 325 MG TABLET
650.0000 mg | ORAL_TABLET | ORAL | Status: DC | PRN
Start: 2022-05-16 — End: 2022-05-18

## 2022-05-16 MED ORDER — LORAZEPAM 0.5 MG TABLET
0.5000 mg | ORAL_TABLET | ORAL | Status: AC
Start: 2022-05-16 — End: 2022-05-16
  Administered 2022-05-16: 0.5 mg via ORAL

## 2022-05-16 MED ORDER — ALUMINUM-MAG HYDROXIDE-SIMETHICONE 200 MG-200 MG-20 MG/5 ML ORAL SUSP
30.0000 mL | ORAL | Status: DC | PRN
Start: 2022-05-16 — End: 2022-05-18

## 2022-05-16 MED ORDER — ACETAMINOPHEN 325 MG TABLET
ORAL_TABLET | ORAL | Status: AC
Start: 2022-05-16 — End: 2022-05-16
  Filled 2022-05-16: qty 3

## 2022-05-16 NOTE — ED Nurses Note (Signed)
Report called to [facility] at this time. [facility] arrived to transport pt. PT alert/o x4.

## 2022-05-16 NOTE — ED Provider Notes (Signed)
[facility] Emergency Department  ED Primary Provider Note  History of Present Illness   Chief Complaint   Patient presents with    Medical Screening     Arrival: The patient arrived by Car  Donald Atkins is a 36 y.o. male who had concerns including Medical Screening. Pt states he is hearing voices saying to kill himself. Plan to jump in front on traffic has prior attempts . States was recently at [facility]. Wants to be committed . States he is refusing [facility] and he has rights. Advised will have to go through the appropriate channels to find psych placement . States he wants to be locked up.     Review of Systems   Constitutional: No fever, chills or weakness   Skin: No rash or diaphoresis  HENT: No headaches, or congestion  Eyes: No vision changes or photophobia   Cardio: No chest pain, palpitations or leg swelling   Respiratory: No cough, wheezing or SOB  GI:  No nausea, vomiting or stool changes  GU:  No dysuria, hematuria, or increased frequency  MSK: No muscle aches, joint or back pain  Neuro: No seizures, LOC, numbness, tingling, or focal weakness  Psychiatric: + depression, SI  auditory hallucinations.  All other systems reviewed and are negative.    Historical Data   History Reviewed This Encounter: all noted and reviewed    Physical Exam   ED Triage Vitals [05/16/22 1309]   BP (Non-Invasive) 136/82   Heart Rate 84   Respiratory Rate 18   Temperature 36.7 C (98 F)   SpO2 99 %   Weight 84.4 kg (186 lb)   Height 1.88 m (_0 )   }  Constitutional:  36 y.o. male who appears in no distress. Normal color, no cyanosis.   HENT:   Head: Normocephalic and atraumatic.   Mouth/Throat: Oropharynx is clear and moist.   Eyes: EOMI, PERRL   Neck: Trachea midline. Neck supple.  Cardiovascular: RRR, No murmurs, rubs or gallops. Intact distal pulses.  Pulmonary/Chest: BS equal bilaterally. No respiratory distress. No wheezes, rales or chest tenderness.   Abdominal: Bowel  sounds present and normal. Abdomen soft, no tenderness, no rebound and no guarding.  Back: No midline spinal tenderness, no paraspinal tenderness, no CVA tenderness.           Musculoskeletal: No edema, tenderness or deformity.  Skin: warm and dry. No rash, erythema, pallor or cyanosis  Psychiatric: anxious. Interm eye contact fidelity, cooperative si.    Neurological: Patient keenly alert and responsive, easily able to raise eyebrows, facial muscles/expressions symmetric, speaking in fluent sentences, moving all extremities equally and fully, normal gait  Patient Data     Labs Ordered/Reviewed   SALICYLATE ACID LEVEL - Abnormal; Notable for the following components:       Result Value    SALICYLATE LEVEL 0 (*)     All other components within normal limits   CBC WITH DIFF - Abnormal; Notable for the following components:    HCT 40.7 (*)     RDW 16.4 (*)     All other components within normal limits   URINE DRUG SCREEN - Normal   ACETAMINOPHEN LEVEL - Normal   ETHANOL, SERUM/PLASMA - Normal   URINALYSIS, MACRO/MICRO - Normal   URINALYSIS WITH REFLEX MICROSCOPIC AND CULTURE IF POSITIVE    Narrative:     The following orders were created for panel order URINALYSIS WITH REFLEX MICROSCOPIC AND CULTURE IF POSITIVE.  Procedure  Abnormality         Status                     ---------                               -----------         ------                     URINALYSIS, MACRO/MICRO[[id number]]      Normal              Final result                 Please view results for these tests on the individual orders.   CBC/DIFF    Narrative:     The following orders were created for panel order CBC/DIFF.  Procedure                               Abnormality         Status                     ---------                               -----------         ------                     CBC WITH TYOM[[id number]]                Abnormal            Final result                 Please view results for these tests on  the individual orders.   COMPREHENSIVE METABOLIC PANEL, NON-FASTING    Narrative:     Estimated Glomerular Filtration Rate (eGFR) is calculated using the CKD-EPI (2021) equation, intended for patients 77 years of age and older. If gender is not documented or "unknown", there will be no eGFR calculation.   COVID-19 [place] MOLECULAR LAB TESTING   LITHIUM LEVEL     No orders to display     Medical Decision Making   Diff dx of si major depression.   :[id number]}  Risk Factors:    Overwhelmed  Protective Factors:    none  Suicide Inquiry:    Thoughts:   ideation with a plan--jump in front of car     Plans/Behaviors/Past attempts:   threats to jump in front of motor vehicle  Intent:   Patient has no means to carry out plan.    Risk Level:     Based on my assessment, the patient is a high risk of suicide     Intervention:     HIGH RISK: Patient has been found to be [place] for suicide.  Admission generally indicated unless a significant change reduces risk. Suicide precautions initiated including 1:1 observation until transferred to psychiatric unit/facility or risk is downgraded, removal of potential lethal objects and personal belongings from the room until transfer, and other measures advised by psych.    Documentation  Given the above risk level the following plan of care was developed:    Admission is required due to si.  1:1 observation was required due to si  .  Medication management - unchanged.   Behavioral health referral was offered and was accepted by the patient.  Contact with supportive persons in their life was encouraged. Patient identifies none as a person of trust and support.  Patient counseled by provider on Suicide prevention and provided with written information, crisis phone numbers, and resources.  Patient follow up is today with psych of most approp placement. .      Medications Administered in the ED   LORazepam (ATIVAN) tablet (0.5 mg Oral Given 05/16/22 1353)     Clinical Impression   Suicidal  behavior (Primary)   Hallucination       Disposition: [facility]

## 2022-05-16 NOTE — ED Nurses Note (Signed)
Patient not seen by [facility] staff. Patient has negative audit-C.

## 2022-05-16 NOTE — ED Nurses Note (Signed)
Pt given po fluids and snacks. Discussed admitting to [facility] and pt agreed after talking for a few mins that he would go.

## 2022-05-16 NOTE — ED Nurses Note (Signed)
Pt states he is hearing voices saying to kill himself, he also saying he has SI thoughts and has a plan to jump out in front of moving vehicle.

## 2022-05-16 NOTE — ED Nurses Note (Signed)
Pt resting in bed, [employer] at side, and our staff at side, see SI flowsheet.

## 2022-05-16 NOTE — ED Triage Notes (Addendum)
Hearing voices telling himself to kill himself. Patient was denied from [facility].  Patient states he has a right to refuse [facility] to the [facility].

## 2022-05-16 NOTE — ED Nurses Note (Signed)
Amy from [facility] states patient is accepted they just dont have a bed yet.

## 2022-05-16 NOTE — ED Nurses Note (Signed)
Spoke with Amy at [facility] and she is calling Donald Atkins patients legal guardian at this time. [phone].

## 2022-05-16 NOTE — Care Plan (Signed)
Patient new admission to facility.  Patient agreed to safety contract while in the hospital.  Adult Behavioral Health Plan of Care  Goal: Plan of Care Review  Outcome: Ongoing (see interventions/notes)  Goal: Patient-Specific Goal (Individualization)  Outcome: Ongoing (see interventions/notes)  Goal: Strengths and Vulnerabilities  Outcome: Ongoing (see interventions/notes)  Goal: Adheres to Safety Considerations for Self and Others  Outcome: Ongoing (see interventions/notes)  Goal: Absence of New-Onset Illness or Injury  Outcome: Ongoing (see interventions/notes)  Goal: Optimized Coping Skills in Response to Life Stressors  Outcome: Ongoing (see interventions/notes)  Goal: Develops/Participates in Therapeutic Alliance to Support Successful Transition  Outcome: Ongoing (see interventions/notes)  Intervention: Multimedia programmer  Recent Flowsheet Documentation  Taken 05/16/2022 2238 by Coralie Keens, RN  Trust Relationship/Rapport:   care explained   thoughts/feelings acknowledged   questions encouraged  Goal: Rounds/Family Conference  Outcome: Ongoing (see interventions/notes)

## 2022-05-17 ENCOUNTER — Telehealth (HOSPITAL_BASED_OUTPATIENT_CLINIC_OR_DEPARTMENT_OTHER): Payer: Self-pay

## 2022-05-17 DIAGNOSIS — F411 Generalized anxiety disorder: Secondary | ICD-10-CM

## 2022-05-17 DIAGNOSIS — T189XXA Foreign body of alimentary tract, part unspecified, initial encounter: Secondary | ICD-10-CM

## 2022-05-17 DIAGNOSIS — R45851 Suicidal ideations: Secondary | ICD-10-CM

## 2022-05-17 DIAGNOSIS — Z658 Other specified problems related to psychosocial circumstances: Secondary | ICD-10-CM

## 2022-05-17 DIAGNOSIS — F5089 Other specified eating disorder: Secondary | ICD-10-CM

## 2022-05-17 DIAGNOSIS — F209 Schizophrenia, unspecified: Secondary | ICD-10-CM

## 2022-05-17 DIAGNOSIS — Z9152 Personal history of nonsuicidal self-harm: Secondary | ICD-10-CM

## 2022-05-17 LAB — COMPREHENSIVE METABOLIC PANEL, NON-FASTING
ALBUMIN/GLOBULIN RATIO: 1.5 — ABNORMAL HIGH (ref 0.8–1.4)
ALBUMIN: 4.2 g/dL (ref 3.5–5.7)
ALKALINE PHOSPHATASE: 79 U/L (ref 34–104)
ALT (SGPT): 60 U/L — ABNORMAL HIGH (ref 7–52)
ANION GAP: 5 mmol/L (ref 4–13)
AST (SGOT): 37 U/L (ref 13–39)
BILIRUBIN TOTAL: 0.5 mg/dL (ref 0.3–1.2)
BUN/CREA RATIO: 12 (ref 6–22)
BUN: 10 mg/dL (ref 7–25)
CALCIUM, CORRECTED: 9.3 mg/dL (ref 8.9–10.8)
CALCIUM: 9.5 mg/dL (ref 8.6–10.3)
CHLORIDE: 109 mmol/L — ABNORMAL HIGH (ref 98–107)
CO2 TOTAL: 26 mmol/L (ref 21–31)
CREATININE: 0.86 mg/dL (ref 0.60–1.30)
ESTIMATED GFR: 116 mL/min/{1.73_m2} (ref >59)
GLOBULIN: 2.8 — ABNORMAL LOW (ref 2.9–5.4)
GLUCOSE: 101 mg/dL (ref 74–109)
OSMOLALITY, CALCULATED: 279 mOsm/kg (ref 270–290)
POTASSIUM: 4.2 mmol/L (ref 3.5–5.1)
PROTEIN TOTAL: 7 g/dL (ref 6.4–8.9)
SODIUM: 140 mmol/L (ref 136–145)

## 2022-05-17 LAB — URINE DRUG SCREEN
AMPHET QL: NEGATIVE
BARB QL: NEGATIVE
BENZO QL: NEGATIVE
BUP QL: NEGATIVE
CANNAQL: NEGATIVE
COCQL: NEGATIVE
FENTANYL, RANDOM URINE: NEGATIVE
METHQL: NEGATIVE
OPIATE: NEGATIVE
OXYCODONE URINE: NEGATIVE
PCP QL: NEGATIVE

## 2022-05-17 LAB — CBC
HCT: 41.4 % (ref 36.7–47.1)
HGB: 13.8 g/dL (ref 12.5–16.3)
MCH: 28.4 pg (ref 23.8–33.4)
MCHC: 33.2 g/dL (ref 32.5–36.3)
MCV: 85.5 fL (ref 73.0–96.2)
MPV: 8.6 fL (ref 7.4–11.4)
PLATELETS: 246 10*3/uL (ref 140–440)
RBC: 4.84 10*6/uL (ref 4.06–5.63)
RDW: 13.6 % (ref 12.1–16.2)
WBC: 4.3 10*3/uL (ref 3.6–10.2)

## 2022-05-17 LAB — URINALYSIS, MACROSCOPIC
BILIRUBIN: NEGATIVE mg/dL
BLOOD: NEGATIVE mg/dL
GLUCOSE: NEGATIVE mg/dL
KETONES: NEGATIVE mg/dL
LEUKOCYTES: NEGATIVE WBCs/uL
NITRITE: NEGATIVE
PH: 6 (ref 5.0–9.0)
PROTEIN: 10 mg/dL
SPECIFIC GRAVITY: 1.012 (ref 1.002–1.030)
UROBILINOGEN: NORMAL mg/dL

## 2022-05-17 LAB — URINALYSIS, MICROSCOPIC
RBCS: 1 /hpf (ref <4)
SQUAMOUS EPITHELIAL: <1 /hpf (ref <28)
WBCS: 1 /hpf (ref <6)

## 2022-05-17 MED ORDER — PANTOPRAZOLE 40 MG TABLET,DELAYED RELEASE
40.0000 mg | DELAYED_RELEASE_TABLET | Freq: Every day | ORAL | Status: DC
Start: 2022-05-17 — End: 2022-05-18
  Administered 2022-05-17 – 2022-05-18 (×2): 40 mg via ORAL
  Filled 2022-05-17 (×2): qty 1

## 2022-05-17 MED ORDER — QUETIAPINE 100 MG TABLET
100.0000 mg | ORAL_TABLET | Freq: Two times a day (BID) | ORAL | Status: DC
Start: 2022-05-17 — End: 2022-05-18
  Administered 2022-05-17 – 2022-05-18 (×3): 100 mg via ORAL
  Filled 2022-05-17 (×3): qty 1

## 2022-05-17 MED ORDER — HALOPERIDOL 5 MG TABLET
10.0000 mg | ORAL_TABLET | Freq: Two times a day (BID) | ORAL | Status: DC
Start: 2022-05-17 — End: 2022-05-18
  Administered 2022-05-17 – 2022-05-18 (×3): 10 mg via ORAL
  Filled 2022-05-17 (×3): qty 2

## 2022-05-17 MED ORDER — BENZTROPINE 1 MG TABLET
1.0000 mg | ORAL_TABLET | Freq: Two times a day (BID) | ORAL | Status: DC
Start: 2022-05-17 — End: 2022-05-18
  Administered 2022-05-17 – 2022-05-18 (×3): 1 mg via ORAL
  Filled 2022-05-17 (×3): qty 1

## 2022-05-17 MED ORDER — DIVALPROEX 500 MG TABLET,DELAYED RELEASE
500.0000 mg | DELAYED_RELEASE_TABLET | Freq: Two times a day (BID) | ORAL | Status: DC
Start: 2022-05-17 — End: 2022-05-18
  Administered 2022-05-17 – 2022-05-18 (×3): 500 mg via ORAL
  Filled 2022-05-17 (×3): qty 1

## 2022-05-17 NOTE — Care Plan (Signed)
Patient voiced si to hang himself or eat utensils.  Per patient he has been very depressed. Rated depression 8/10     Problem: Adult Behavioral Health Plan of Care  Goal: Plan of Care Review  05/17/2022 0201 by Coralie Keens, RN  Outcome: Ongoing (see interventions/notes)  05/16/2022 2255 by Coralie Keens, RN  Outcome: Ongoing (see interventions/notes)  Flowsheets (Taken 05/16/2022 2130)  Patient Agreement with Plan of Care: agrees  Consent Given to Review Plan with: patient has guardian  Goal: Patient-Specific Goal (Individualization)  05/17/2022 0201 by Coralie Keens, RN  Outcome: Ongoing (see interventions/notes)  05/16/2022 2255 by Coralie Keens, RN  Outcome: Ongoing (see interventions/notes)  Flowsheets (Taken 05/16/2022 2130)  Individualized Care Needs: effective coping mechanisms  Anxieties, Fears or Concerns: patient wants to go back to [facility]  Patient-Specific Goals (Include Timeframe): discharge back to [facility]  Goal: Strengths and Vulnerabilities  Outcome: Ongoing (see interventions/notes)  Goal: Adheres to Safety Considerations for Self and Others  05/17/2022 0201 by Coralie Keens, RN  Outcome: Ongoing (see interventions/notes)  05/16/2022 2255 by Coralie Keens, RN  Outcome: Ongoing (see interventions/notes)  Intervention: Develop and Maintain Individualized Safety Plan  Recent Flowsheet Documentation  Taken 05/16/2022 2130 by Coralie Keens, RN  Safety Measures:   safety rounds completed   suicide assessment completed   safety plan reviewed  Goal: Absence of New-Onset Illness or Injury  05/17/2022 0201 by Coralie Keens, RN  Outcome: Ongoing (see interventions/notes)  05/16/2022 2255 by Coralie Keens, RN  Outcome: Ongoing (see interventions/notes)  Intervention: Identify and Manage Fall Risk  Recent Flowsheet Documentation  Taken 05/16/2022 2130 by Coralie Keens, RN  Safety Measures:   safety rounds completed   suicide assessment completed   safety plan reviewed  Intervention: Prevent Skin Injury  Recent Flowsheet  Documentation  Taken 05/16/2022 2130 by Coralie Keens, RN  Skin Protection: (avoid latex)   adhesive use limited   other (see comments)  Intervention: Prevent VTE (Venous Thromboembolism)  Recent Flowsheet Documentation  Taken 05/16/2022 2130 by Coralie Keens, RN  VTE Prevention/Management: ambulation promoted  Intervention: Prevent Infection  Recent Flowsheet Documentation  Taken 05/16/2022 2130 by Coralie Keens, RN  Infection Prevention:   rest/sleep promoted   promote handwashing  Goal: Optimized Coping Skills in Response to Life Stressors  05/17/2022 0201 by Coralie Keens, RN  Outcome: Ongoing (see interventions/notes)  05/16/2022 2255 by Coralie Keens, RN  Outcome: Ongoing (see interventions/notes)  Intervention: Promote Effective Coping Strategies  Recent Flowsheet Documentation  Taken 05/16/2022 2130 by Coralie Keens, RN  Supportive Measures:   active listening utilized   verbalization of feelings encouraged  Goal: Develops/Participates in Therapeutic Alliance to Support Successful Transition  05/17/2022 0201 by Coralie Keens, RN  Outcome: Ongoing (see interventions/notes)  05/16/2022 2255 by Coralie Keens, RN  Outcome: Ongoing (see interventions/notes)  Intervention: Malen Gauze Therapeutic Alliance  Recent Flowsheet Documentation  Taken 05/16/2022 2238 by Coralie Keens, RN  Trust Relationship/Rapport:   care explained   thoughts/feelings acknowledged   questions encouraged  Intervention: Mutually Develop Transition Plan  Recent Flowsheet Documentation  Taken 05/16/2022 2300 by Coralie Keens, RN  Transportation Concerns: none  Goal: Rounds/Family Conference  05/17/2022 0201 by Coralie Keens, RN  Outcome: Ongoing (see interventions/notes)  05/16/2022 2255 by Coralie Keens, RN  Outcome: Ongoing (see interventions/notes)

## 2022-05-17 NOTE — H&P (Signed)
PRN [facility] PAVILION OF THE [facility]    Psychiatric Evaluation      Name: Donald Atkins  DOB: 07/29/1985  MRN: [id number]  Date of Admission: 05/16/2022     Reason for hospitalization:  Suicidal ideation with a plan    Type of Admission:   Voluntary with guardian Donald Atkins [phone]    Chief complaint:  "I want to go to the [facility], also swallowed 1 of these.  "    History of present illness:  Donald Atkins is a 36 year old white male who presents from [facility] with a history of schizophrenia.  He presented to the emergency department with an increase in suicidal ideation and auditory hallucinations.  We are familiar with this patient from previous hospitalizations.  He was admitted here in April for almost identical symptomatology and ended up being [facility].  Crisis intake states that the patient reported suicidal ideation with a plan to jump into traffic.  Also endorsed auditory hallucinations of voices commanding him to kill himself.  Apparently he was just discharged from [facility].  He required Ativan and Geodon in the emergency department.  He has a longstanding history of mental illness and has a legal guardian in [place].  Today the patient was evaluated in PICU.  He is calm and cordial on approach and immediately states "I want to go to the state hospital."He also tells me that he has already swallowed a plastic shower curtain hook which he removed from the ceiling.  He tells me that he removed 2 of the hooks, he has 1 lying on the window sill and states that he swallowed the other.  He is denying any physical complaints and does not appear to be in any acute distress.  He is not very talkative today and requests that I send him back to the state hospital.  He has a history of swallowing foreign objects including razor blades and plastic spoon.  He had been living in a group home in [place] but was kicked out for violent behavior.  It appears  that he has been living locally with family.  I did attempt to contact Donald Atkins at the number listed however there was no answer, I did leave a message requesting a call back as soon as possible.  He was deemed medically stable in the ER prior to transfer.  COVID test and UDS were both negative.  Ethanol level was less than 3.  Lithium level less than 0.10, Depakote level less than 10.0.    Past Psychiatric History:  The patient has been psychiatrically hospitalized multiple times in the past.  He has been committed to the [facility] in [facility] 4 times in the past.  [facility] [place] [facility] a child.  He has a previous hospitalization here at the [facility] in April for almost identical complaints and was committed during that admission.  He was just discharged from [facility].  He reports being diagnosed with schizophrenia and has a longstanding history of psychosis.  He has a history of self-harm behavior and cutting in the past.  He is also tried to swallow foreign objects and attempted to hang himself about a year ago.  He has a guardian in [place].  No history of drug or alcohol abuse.  He is currently prescribed Depakote, Seroquel, Haldol, Cogentin.  He has received Haldol Decanoate and Invega injections in the past.  It is unclear  at this time when his last Haldol Decanoate injection was.  He is also been prescribed Lexapro and lithium in the past.  He has a longstanding history of violent behavior.     Past medical history:  Allergy to penicillin and latex.  No medical or surgical history reported.  No history of head trauma, LOC. history of seizure.    Physical exam/ROS: Please see dictated hospitalist's note dated 05/17/2022    Family psychiatric history:  No known family psychiatric history or completed suicide    Family medical history:  Positive for diabetes    Social history:  Patient is originally from [place].  He was raised primarily in group  homes.  He was removed from the family home as a child due to violent behavior.  Both of his parents still reside in [place].  He has a sister who had been living locally.  He is never been married and has no children.  He has no current legal issues but does have a history of Federal indictment at the age of 24 for threatening to kill a judge.  No military background.  He graduated from high school.  He is unemployed and on disability.  No history of substance or alcohol abuse.    MSE:  The patient is alert and oriented x4, dressed in PICU scrubs, fair eye contact, disheveled a bit, appearing stated age.  He has multiple facial tattoos. Speech is normal rate and tone. Patient is not very talkative and appears depressed. There is no flight of ideas, loosening of associations, or tangential speech. Not manic. Mood is sad. Affect flat and constricted. Patient does not appear to be in any acute physical distress.  positive suicidal ideations, no homicidal ideation.  Positive command auditory hallucinations.  No visual hallucinations, no delusions, mild paranoia. No signs of psychosis.  no plans to harm others. Patient is not aggressive or threatening. Thoughts are linear, logical, and goal directed. Intellectual functioning is good. Memory is intact to recent, remote, and past events. Patient can recall 3 of 3 objects at 0 and 5 minutes, and what was eaten for last meal. Patient is able to provide details of current situation. Patient can name the president. Language is good. Vocabulary is unimpaired, no word finding difficulty or word misuse. Intelligence is fair, patient interprets a proverb, and reports apple and orange similarly concretely. Calculation is unimpaired. Concentration is good, able to recite days of week forward and backward. Insight fair patient is compliant with medications and is aware of his illness however and judgment  based on recent actions and self-harm behavior.     Diagnosis:  Axis  I:1 schizophrenia, unspecified with suicidal ideation and recent self-harm behavior 2.  Generalized anxiety disorder, 3 differential diagnosis includes schizoaffective disorder, bipolar type and borderline intellectual function  Axis II:  Deferred  Axis III: Allergy to penicillin and latex.  No medical or surgical history reported.  No history of head trauma, LOC. history of seizures  Axis IV: Severe psychosocial stressors  Axis V:  Admission GAF: 20 , Best in past year: unknown    Patient strengths and weaknesses:  See counselor's psychosocial evaluation    Provisional discharge plan:  Undetermined at this time    Plan:  The patient will be admitted for stabilization. Medicines will be instituted and adjusted.  I was unable to reach guardian by phone today but have left a message requesting a call back.  We are going to continue his regular medications  including Depakote 500 mg b.i.d. for mood, Seroquel 100 mg b.i.d. for mood, psychosis and insomnia, augmentation with Haldol 10 mg b.i.d. for mood and psychosis, Cogentin 1 mg daily for EPS.  We will try to confirm the last dose of long-acting injectable.  Patient will remain in PICU for now.  We will likely proceed with filing a mental hygiene per the patient's request and due to the fact that he has already swallowed a foreign body.  He has severe and persistent mental illness that has required lengthy admissions to the state hospital in the past and was just discharged from [facility]. I have made the hospitalist aware of the patient reporting that he swallowed a foreign body.  Charge nurses also aware.   We have discussed medicine side-effects, treatment options, and pregnancy precautions if applicable. The patient agrees with the plan and will be encouraged to be active in groups and milieu treatment. Patient voices understanding and agrees with the plan. Patient aware of unit rules and regulations and will be seen by the hospitalist for medical issues. Further  changes will be dictated by clinical course. I estimate patient length of stay will be 7-10 days.    I certify that the inpatient psychiatric admission is medically necessary for treatment which can be reasonably expected to improve the patient's condition, and/or for diagnostic study.    The patient's clinical information and treatment decisions were personally discussed with attending physician and discussed in treatment team.    Time taken for dictation, treatment team staffing, patient interview, paperwork preparation, and review of the record exceeded 75 minutes on the day of admission and was of a complex level of decision making.    Holly Disibbio, PMHNP-BC     ATTENDING ATTESTATION:  The NP/PA evaluated the patient and provided care. I have reviewed the note. Discussed the patient in treatment team. I agree with their findings and at this time have no amendments to the current plan.     Emi Belfast Barry Dienes, DO

## 2022-05-17 NOTE — Nurses Notes (Signed)
7a-7p note Patient withdrawn to room. Patient states he hears voices telling him to swallow things that are not edible. Patient swallowed a hook off a hanger that goes to his door. Patient states he is SI with a plan to hang. Patient denies any HI at this time. Patient states he wants to go to [facility] and the commitment process is stated. Patient is being monitored q 15 minutes by staff.

## 2022-05-17 NOTE — Discharge Summary (Signed)
[facility]N [facility] PAVILION OF THE [facility]     Behavioral Health Discharge Summary    Name: Donald Atkins  MRN: [id number]  Date of Admission: 05/16/2022   Discharge Date:   05/17/2022      Type of Discharge:  Commitment to [facility]    Reason for hospitalization:  Suicidal ideation with a plan     Hospital Course:  Donald Atkins is a 36 year old white male who presents from [facility] with a history of schizophrenia.  He presented to the emergency department with an increase in suicidal ideation and auditory hallucinations.  We are familiar with this patient from previous hospitalizations.  He was admitted here in April for almost identical symptomatology and ended up being committed to the state hospital.  Crisis intake states that the patient reported suicidal ideation with a plan to jump into traffic.  Also endorsed auditory hallucinations of voices commanding him to kill himself.  Apparently he was just discharged from [facility].  He required Ativan and Geodon in the emergency department.  He has a longstanding history of mental illness and has a legal guardian in [place].  Today the patient was evaluated in PICU.  He is calm and cordial on approach and immediately states "I want to go to the state hospital."He also tells me that he has already swallowed a plastic shower curtain hook which he removed from the ceiling.  He tells me that he removed 2 of the hooks, he has 1 lying on the window sill and states that he swallowed the other.  He is denying any physical complaints and does not appear to be in any acute distress.  He is not very talkative today and requests that I send him back to the state hospital.  He has a history of swallowing foreign objects including razor blades and plastic spoon.  He had been living in a group home in [place] but was kicked out for violent behavior.  It appears that he has been living locally with family.  I did attempt to contact [name] at the  number listed however there was no answer, I did leave a message requesting a call back as soon as possible.  He was deemed medically stable in the ER prior to transfer.  COVID test and UDS were both negative.  Ethanol level was less than 3.  Lithium level less than 0.10, Depakote level less than 10.0.     Patient was admitted to the hospital for stabilization of admission symptoms. Medicines were instituted and adjusted.  The patient has persistent and severe mental illness and has required numerous lengthy hospitalizations.  He has been committed to the state hospital in [place] multiple times and was just discharged from [place].  The patient continues to endorse suicidal ideation with a plan to walk into traffic as well as command auditory hallucinations that tell him to hurt himself.  He is requesting commitment and transfer back to the state hospital.  The patient has had self-harm behavior on the unit.  Based on the patient's presentation and history we feel that he requires a higher level of care offered our facility.  We have attempted to contact [facility] guardian in [place] as well as family however we have been unable to reach either party.         Discharge Medications:     Current Discharge Medication List        CONTINUE these medications - NO CHANGES were made  during your visit.        Details   benztropine 1 mg Tablet  Commonly known as: COGENTIN   1 mg, Oral, 2 TIMES DAILY  Refills: 0     divalproex 500 mg Tablet, Delayed Release (E.C.)  Commonly known as: DEPAKOTE   500 mg, Oral, 2 TIMES DAILY  Refills: 0     haloperidoL 10 mg Tablet  Commonly known as: HALDOL   10 mg, Oral, 2 TIMES DAILY  Refills: 0     omeprazole 20 mg Capsule, Delayed Release(E.C.)  Commonly known as: PRILOSEC   20 mg, Oral, DAILY  Refills: 0     QUEtiapine 100 mg Tablet  Commonly known as: SEROquel   100 mg, Oral, 2 TIMES DAILY  Refills: 0            STOP taking these medications.      escitalopram oxalate 10 mg  Tablet  Commonly known as: LEXAPRO     hydrOXYzine pamoate 50 mg Capsule  Commonly known as: VISTARIL     INVEGA SUSTENNA IM     lithium carbonate 150 mg Capsule  Commonly known as: ESKALITH     pantoprazole 40 mg Tablet, Delayed Release (E.C.)  Commonly known as: PROTONIX              Condition at discharge:  Stable for transfer with positive suicidal ideation and auditory hallucination    Discharge Diagnosis:  Axis I:1 schizophrenia, unspecified with suicidal ideation and recent self-harm behavior 2.  Generalized anxiety disorder, 3 differential diagnosis includes schizoaffective disorder, bipolar type and borderline intellectual function  Axis II:  Deferred  Axis III: Allergy to penicillin and latex.  No medical or surgical history reported.  No history of head trauma, LOC. history of seizures  Axis IV: Severe psychosocial stressors  Axis V:  Admission GAF: 20 , Best in past year: unknown    Follow Up plan:  Discharge to state hospital    Time taken on day of discharge exceeded 30 minutes and included medication reconciliation, treatment team staffing, discussion with patient, review of record and completion of all documentation.    Holly Disibbio, PMHNP-BC     ATTENDING ATTESTATION:  The NP/PA evaluated the patient and provided care. I have reviewed the note. Discussed the patient in treatment team. I agree with their findings and at this time have no amendments to the current plan.     Emi Belfast Barry Dienes, DO

## 2022-05-17 NOTE — Nurses Notes (Signed)
Patient discharged on a commitment.  AVS reviewed with patient/care giver.  A written copy of the AVS and discharge instructions was given to the patient/care giver.  Questions sufficiently answered as needed.  Patient/care giver encouraged to follow up with PCP as indicated.  In the event of an emergency, patient/care giver instructed to call 911 or go to the nearest emergency room.

## 2022-05-17 NOTE — Consults (Signed)
[place] MEDICINE [facility]  Hospitalist Consultation    Date of Service:  05/17/2022  Donald Atkins   36 y.o. male  Date of Admission:  05/16/2022  Date of Birth:  15-Aug-1985  1         History of Present Illness:  Patient is a 36 year old Caucasian male who is here with suicide ideation with plan and the need for psychiatric stabilization.  Patient states he has no known medical issues other than PICA.  He does swallow a lot of foreign objects and apparently swallow small plastic object from the bathroom this morning.  He states "I do that all the time and this is not a big deal".  He appears quite comfortable.  I do find him stable at this time.                History:    Past Medical:    Past Medical History:   Diagnosis Date    Bipolar disorder, unspecified (CMS HCC)     Borderline intellectual functioning     Convulsions (CMS HCC)     Convulsions (CMS HCC)     GAD (generalized anxiety disorder)     GERD (gastroesophageal reflux disease)     Mixed hyperlipidemia     Schizoaffective disorder (CMS HCC)     Schizophrenia (CMS HCC)       Past Surgical:  History reviewed. No pertinent surgical history.   Family:    Family Medical History:    None        Social:   reports that he has never smoked. He has never been exposed to tobacco smoke. He has never used smokeless tobacco. He reports that he does not drink alcohol and does not use drugs.     REVIEW OF SYSTEMS:  Full ROS performed. ROS negative if not mentioned in HPI.    Allergies   Allergen Reactions    Latex     Penicillins Anaphylaxis    Latex        Medications:  Medications Prior to Admission       Prescriptions    benztropine (COGENTIN) 1 mg Oral Tablet    Take 1 Tablet (1 mg total) by mouth Twice daily    divalproex (DEPAKOTE) 500 mg Oral Tablet, Delayed Release (E.C.)    Take 1 Tablet (500 mg total) by mouth Twice daily    escitalopram oxalate (LEXAPRO) 10 mg Oral Tablet    Take 1 Tablet (10 mg total) by mouth Once a day    Patient not  taking:  Reported on 05/16/2022    haloperidoL (HALDOL) 10 mg Oral Tablet    Take 1 Tablet (10 mg total) by mouth Twice daily    hydrOXYzine pamoate (VISTARIL) 50 mg Oral Capsule    Take 1 Capsule (50 mg total) by mouth Three times a day as needed for Itching    Patient not taking:  Reported on 05/16/2022    lithium carbonate (ESKALITH) 150 mg Oral Capsule    Take 2 Capsules (300 mg total) by mouth Three times daily with meals    Patient not taking:  Reported on 05/16/2022    omeprazole (PRILOSEC) 20 mg Oral Capsule, Delayed Release(E.C.)    Take 1 Capsule (20 mg total) by mouth Once a day    paliperidone palmitate (INVEGA SUSTENNA IM)    Inject into the muscle    Patient not taking:  Reported on 05/16/2022    pantoprazole (PROTONIX) 40 mg Oral Tablet, Delayed  Release (E.C.)    40 mg per 1 tablet, ORAL, Daily    Patient not taking:  Reported on 05/16/2022    QUEtiapine (SEROQUEL) 100 mg Oral Tablet    Take 1 Tablet (100 mg total) by mouth Twice daily          acetaminophen (TYLENOL) tablet, 650 mg, Oral, Q4H PRN  aluminum-magnesium hydroxide-simethicone (MAG-AL PLUS) 200-200-20 mg per 5 mL oral liquid, 30 mL, Oral, Q4H PRN  benztropine (COGENTIN) tablet, 1 mg, Oral, 2x/day  divalproex (DEPAKOTE) 12 hr delayed release tablet, 500 mg, Oral, 2x/day  haloperidol (HALDOL) tablet, 10 mg, Oral, 2x/day  magnesium hydroxide (MILK OF MAGNESIA) 400mg  per 10mL oral liquid, 30 mL, Oral, HS PRN  pantoprazole (PROTONIX) delayed release tablet, 40 mg, Oral, Daily  QUEtiapine (SEROquel) tablet, 100 mg, Oral, 2x/day  traZODone (DESYREL) tablet, 50 mg, Oral, HS PRN - MR x 1        Physical Exam:  Vitals:  Temperature: 37 C (98.6 F)  Heart Rate: 70  Respiratory Rate: 19  BP (Non-Invasive): 112/68  SpO2: 98 %    Exam:  Nursing note and vitals reviewed.   General: No acute distress, alert and oriented x3.  Patient lying in his bed he is pleasant cooperative.  HEENT: Pharynx without no erythema, exudate. PERRLA, conjunctivae are without  injection or scleral icterus  Neck: Neck supple without lymphadenopathy. Thyroid non-tender and without nodules.  Cardio: Regular rate and rhythm. Normal S1 and S2. No murmurs, gallops, or rubs. PMI located in the midclavicular line.   Resp: Clear to auscultation bilaterally. No wheezes, rales, rhonchi or crackles. Normal resp effort.  Abd: Bowel sounds present in all 4 quadrants. Negative murphy's sign. No rebound tenderness or involuntary guarding. Liver and spleen not palpable.  GU: Deferred exam  Extremities: No edema or swelling noted. No gait disturbances or weakness. Muscle strength 5/5 in bilateral upper and lower extremities.  Skin: No rashes, ulcers, or bruising.  Neuro: CN II - XII grossly intact  I: smell Not tested   II: visual acuity  Visual acuity appears normal bilaterally   II: visual fields Full to confrontation   II: pupils Equal, round, reactive to light   III,VII: ptosis none   III,IV,VI: extraocular muscles  Full ROM   V: mastication normal   V: facial light touch sensation  normal   V,VII: corneal reflex  present   VII: facial muscle function - upper  normal   VII: facial muscle function - lower normal   VIII: hearing Not tested   IX: soft palate elevation  normal   IX,X: gag reflex present   XI: trapezius strength  5/5   XI: sternocleidomastoid strength 5/5   XI: neck flexion strength  5/5   XII: tongue strength  normal      [facility]  [phone] [facility]  [address]                        Phone [phone]  [place], [place]  [place]      Fax: [phone]        Labs:     No results found for any visits on 05/16/22 (from the past 24 hour(s)).    Imaging Studies:        XR KUB AND UPRIGHT ABDOMEN  Narrative: 14/04/23 [place]    RADIOLOGIST: Ivin Booty, MD    XR KUB AND UPRIGHT  ABDOMEN performed on 02/01/2022 1:18 PM.    CLINICAL HISTORY: foreign body.  foreign body    TECHNIQUE: Supine and upright views  of the abdomen.    COMPARISON:  None.    FINDINGS:  Bowel gas pattern is normal.  No evidence of bowel obstruction.  No free air.    No suspicious calcifications.    The bones are unremarkable.  No radiopaque foreign bodies.     Impression: NEGATIVE TWO VIEWS OF THE ABDOMEN.    Radiologist location ID: [id number]      Assessment/Plan:  Patient's physical exam is normal.  He is stable to transfer to another facility from medical standpoint.  He is having no abdominal pains.  He states he swallowed a small plastic foreign object from his bathroom but this is nothing new for him.  He is in no discomfort.  Vital signs, labs and EKG stable.  Again, stable for discharge or transfer to another facility.    Problem List:  Active Hospital Problems   (*Primary Problem)    Diagnosis    *Suicidal ideations       DVT/PE Prophylaxis:  Early ambulation      Leonides Cave, PA-C  [facility] MEDICINE HOSPITALIST

## 2022-05-17 NOTE — ED Nurses Note (Signed)
Referred and Linked to treatment at the [facility].

## 2022-05-17 NOTE — Nurses Notes (Signed)
Patient admitted at this time to picu 2b. Patient alert and oriented. Patient stated that he would like to go back to [facility]. Patient denies pain and discomfort. Patient history and assessment completed. Patient verbalized that he was si to hang himself, eat screws and utensils. Patient denies hi. He stated that he has been having audio/visual hallucinations.  He stated that the voices are telling him to hurt himself. Patient rated his anxiety and depression 8/10. Patient placed on hanging precautions. Patient oriented to unit. Will continue to visually monitor q 15 minute and prn safety rounds.

## 2022-05-17 NOTE — Discharge Instructions (Signed)
Reason for Admission:  Suicidal ideations    LAB TESTING      Lab Results   Component Value Date    HA1C 5.2 10/04/2021     No results found for: "GLUCOSEFAST"  Lab Results   Component Value Date    CHOLESTEROL 165 10/04/2021    LDLCHOL 41 10/04/2021    TRIG 447 (H) 10/04/2021    TRIG 447 (H) 10/04/2021     Lab Results   Component Value Date/Time    ALBUMIN 4.2 05/17/2022 10:48 AM    TOTALPROTEIN 7.0 05/17/2022 10:48 AM    ALKPHOS 79 05/17/2022 10:48 AM      Lab Results   Component Value Date/Time    AST 37 05/17/2022 10:48 AM    ALT 60 (H) 05/17/2022 10:48 AM     Lab Results   Component Value Date/Time    WBC 4.3 05/17/2022 10:48 AM    HGB 13.8 05/17/2022 10:48 AM    HCT 41.4 05/17/2022 10:48 AM    PLTCNT 246 05/17/2022 10:48 AM     Lab Results   Component Value Date    COVID19PCR Not Detected 05/16/2022     Lab Results   Component Value Date/Time    BUNCRRATIO 12 05/17/2022 10:48 AM      Pended studies: None

## 2022-05-17 NOTE — Behavioral Health (Addendum)
Initial mental hygiene filed. All clinical information and commitment paperwork faxed to [place] at [facility]. [facility] is just awaiting the hold order.     4:20pm Counselor called [facility] to see if they have the [employer] for the mental hygiene hearing. Barnett Applebaum with [facility]  is faxing hold order to counselor now so it can be placed on his chart, and she reports she should be over to [facility] in about an hour to do her assessment.     Hold order received and placed on chart. Hearing scheduled for 9pm tonight. Provider and charge nurse made aware.

## 2022-05-17 NOTE — Behavioral Health (Signed)
[facility]      Biopsychosocial Assessment    Patient: Donald Atkins, Donald Atkins, 36 y.o., White, male  Date of Birth:  [birth year]  MRN: [id number]  Room/Bed: PICU02/B  Medical Record #: [id number]  Date of Admission: 05/16/2022  Admission Type: Voluntary  Status:  Voluntary    Medical Diagnosis and History:  Patient Active Problem List   Diagnosis    Suicidal ideation    Schizophrenia (CMS HCC)    GAD (generalized anxiety disorder)    Suicidal behavior with attempted self-injury (CMS HCC)    Bipolar disorder with depression (CMS HCC)    Auditory hallucinations    Suicidal ideations     Past Medical History:   Diagnosis Date    Bipolar disorder, unspecified (CMS HCC)     Borderline intellectual functioning     Convulsions (CMS HCC)     Convulsions (CMS HCC)     GAD (generalized anxiety disorder)     GERD (gastroesophageal reflux disease)     Mixed hyperlipidemia     Schizoaffective disorder (CMS HCC)     Schizophrenia (CMS [facility])              Date Performed: 05/17/2022    Attending Physician:  Donald Payor, DO/ Donald Lush, NP     Informant: Information was gathered from the patient, legal guardian and clinical record. Legal Guardian is Donald Atkins  who works for an agency in [name]. Phone # [phone]. Counselor unable to reach legal guardian today.     Chief Complaint / Problems:   Patient is a 36 y.o. White male who is single.   Donald Atkins is being admitted voluntarily via consent from his legal guardian.   The patient is english speaking and no communication devices were needed during this assessment.  This patient is well known to the [facility] due to previous admissions the patient was at the [facility] in April of 2023 and mental hygiene had to be filed on him.  The patient was sent to [facility] after the mental hygiene was approved.  The patient was admitted this admission because he was suicidal to jump into traffic and hearing command auditory hallucinations telling him to kill  himself.  The patient even stated he was going to hang himself in the lobby upon arrival to the [facility] for treatment.  The patient has had worsening auditory and visual hallucinations.  The patient has been living with family in [place], [place] since discharge from [facility] about a month ago.    Severity of Stressors and Maladaptive Behavior identified:    Donald Atkins rates depression and anxiety each a 7 on a scale of 1-10 with 10 being the worst.  The patient reports he does have panic attacks, he does feel paranoid and his sleep and appetite have been poor.  The patient states he is still suicidal and having auditory and visual hallucinations.  The patient denies homicidal ideations.  The patient would not state who he follows up on an outpatient basis with.  The patient has a history of noncompliance so it is unknown if he is had any follow-up since discharge from [facility].  The patient was made aware of his right to participate in treatment.  [facility] staff will continue trying to reach the legal guardian to inform her she has the right to participate in treatment as well.       CURRENT FAMILY SITUATION AND PEER GROUP  The patient currently lives in  [place],  [place] with family.  The patient would not elaborate if he still been staying with his sister or if he has been staying with his mother.  The patient refuses to answer any questions about a peer group that he identifies with.  The patient has never been married and has no children.  The patient also would not comment on whether he has ever been abused, neglected or exploited as an adult.  The patient also refused to answer if he is ever abused anyone else.  The patient reports his mother and father both living but he will not comment on the status of the relationship.  Per the legal guardian the patient is not allowed to return to his mother and stepfather's home in [place].  So most likely the patient has been living with his  sister and other family members in [place], [place].  The patient reports he is a middle child and has 1 brother and 1 sister.  The patient states he only likes to talk to his sister.  The patient refused to identify any social, ethnic, cultural, emotional and/or health factors currently stressing him or his family.      Sociocultural Environment and Severity of Stress.   Donald Atkins reports that he does not have a religious preference.  The patient reports his highest level of education completed is that he graduated high school and attended regular classes while in school.  The patient denies any history of [employer].  The patient reports he is currently disabled and lives on a fixed income of SSI.  The patient does have access to some community resources as he receives a [place].  It is unknown at this time if the patient receives food stamps or not.  The patient denies any history of gambling, current legal charges or any legal history.  However the patient does have a legal history because he received a Federal indictment at the age of 14 for wanting to kill a judge.      Tobacco/Chemical Substance History  The patient denies using drugs, alcohol or tobacco products.  The patient's urine drug screen was negative.  Nothing in the record indicates a current substance abuse issue.  Therefore, no further intervention is needed at this time.      Substance Abuse Treatment History:  Patient denies receiving substance abuse treatment.     Developmental History:  The patient reports that he was born in [place], [place].  The patient has lived most of his adult life in [place] as well.  The patient reports his developmental memories of childhood are negative.  The patient denies any history of physical, psychological or sexual abuse as a child.  The patient states "I just did not want to be in that home because I did not like it. "  The patient denies any unusual childhood illnesses,  injuries or diseases.  Counselor isn't really sure what transpired that led to the patient getting point appointed a legal guardian in the state of [place] or the fact that the patient is legally not allowed to return to his mother and stepfather's residence in [place].    Activity Assessment and Discharge Recommendations:  The patient reports he has no hobbies or interests.  The patient would benefit from activities that would provide him with positive outlets for his feelings and emotions.  The patient also needs activities to improve reality based functioning as well as activities that would help stabilize his mood and  behavior.    Primary Therapist Assessment:  The patient was alert and oriented during the assessment.  There are overt signs of psychosis as the patient is still admitting to having auditory and visual hallucinations.  The patient also presents in a paranoid fashion.  The patient also reports he is still having suicidal ideations.  The patient tore down his shower curtain this morning while in the PICU unit and managed to get some plastic clips holding the shower curtain up and ate one of them.  The patient has a history of self-harm such as cutting himself and eating foreign objects.  The patient's mood and affect is depressed and flat.  The patient's behavior is somewhat uncooperative.  The patient laid on his bed and kept his arms inside of his scrub top and eyes closed during the assessment.  The patient did not answer all the questions during the assessment.  The patient is dressed in hospital attire appears somewhat disheveled and unkept.  The patient's tone of voice was soft.  The patient made no eye contact during the assessment.  The patient's estimated intelligence level is below average.  The patient's insight and judgment are poor.  There does not appear to be any issues with recent or remote memories.  There is evidence of historical psychosis and delusional thinking.   The patient self-esteem and ego strength are limited.  The patient's strengths are his access to some community resources.  The patient's weaknesses are insight, motivation, coping skills and impaired cognition.    Plan:  While on the unit, the patient will be involved in individual, group, activity, psychoactive and milieu therapies in order to reduce his symptoms to a more manageable and functional level.  The patient has a legal guardian, so communication will be maintained in regards to the patient's treatment and discharge planning.    The provider filed a mental hygiene on the patient and we are requesting transfer to the [facility] for a higher level of care.    Discharge Plan:    It is unknown at this time where the patient will return after discharge once the mental hygiene has been dropped.  The [facility] is requesting transfer to the [facility] for higher level of care currently.  Outpatient services will be scheduled and patient will be encouraged to participate and follow through with outpatient treatment.        Safety Plan:  Safety plan given to patient to be discussed and completed with counselor prior to discharge from facility.       Transportation:  Upon discharge, [place] will be transported by the [facility].

## 2022-05-18 NOTE — Nurses Notes (Signed)
7P-7A NURSING NOTE. PATIENT DENIES ANY SI/HI/AVT HALLUCINATIONS THIS SHIFT. PATIENT WITHDRAWN TO ROOM THIS SHIFT. CALM AND COOPERATIVE WITH MEDICATIONS. PATIENT OBSERVED RESTING IN ROOM WITH EYES CLOSED DURING THE Q15MIN AND PRN SAFETY CHECKS. WILL CONTINUE TO MONITOR PER PROTOCOL.

## 2022-05-18 NOTE — Nurses Notes (Signed)
PT. DISCHARGED TO SHERIFF DEPARTMENT FOR COMMITMENT TRANSPORT.  ALL CLOTHING RETURNED TO PT. DISCHARGE PACKETS SENT WITH SHERIFFS DEPARTMENT.Marland Kitchen

## 2022-05-18 NOTE — Nurses Notes (Signed)
7A-7P NURSE SHIFT NOTE  PT. IS MOSTLY WITHDRAWN TO ROOM.  PT. DOES HAVE A STEADY GAIT WHEN UP.  PT. IS ALERT AND ORIENTED X 3.  PT. REFUSES TO ANSWER ASSESSMENT QUESTIONS REGARDING SI, HI, A/V HALLUCINATIONS, DELUSIONS, ANXIETY AND DEPRESSION.  He SAYS "I DONT ANSWER THOSE QUESTIONS. PT. HAS FLAT AFFECT.  He IS EATING AND DRINKING WELL.  He DOES NOT INTERACT WITH THE OTHER PTS.  PT. IS COOPERATIVE WITH ASSESSMENT AND MEDICATIONS.  WILL CONTINUE TO MONITOR WITH Q 15 MINUTE AND PRN VISUAL SAFETY CHECKS.

## 2022-05-18 NOTE — Behavioral Health (Signed)
CN confirmed the patient was committed last night and [facility] is awaiting arrival of the sheriff department to transport the patient today.

## 2022-05-19 DIAGNOSIS — Z0289 Encounter for other administrative examinations: Secondary | ICD-10-CM

## 2022-05-19 LAB — ECG 12 LEAD
Atrial Rate: 74 {beats}/min
Calculated P Axis: 8 degrees
Calculated R Axis: 45 degrees
Calculated T Axis: 24 degrees
PR Interval: 146 ms
QRS Duration: 98 ms
QT Interval: 376 ms
QTC Calculation: 417 ms
Ventricular rate: 74 {beats}/min

## 2022-05-23 ENCOUNTER — Other Ambulatory Visit (HOSPITAL_COMMUNITY): Payer: Self-pay

## 2022-07-21 ENCOUNTER — Emergency Department
Admission: EM | Admit: 2022-07-21 | Discharge: 2022-07-22 | Disposition: A | Discharge status: Other Acute Inpatient Hospital | Payer: Medicaid - Out of State | Attending: Emergency Medicine | Admitting: Emergency Medicine

## 2022-07-21 ENCOUNTER — Other Ambulatory Visit: Payer: Self-pay

## 2022-07-21 ENCOUNTER — Encounter (HOSPITAL_BASED_OUTPATIENT_CLINIC_OR_DEPARTMENT_OTHER): Payer: Self-pay

## 2022-07-21 DIAGNOSIS — Z91128 Patient's intentional underdosing of medication regimen for other reason: Secondary | ICD-10-CM | POA: Insufficient documentation

## 2022-07-21 DIAGNOSIS — Z1152 Encounter for screening for COVID-19: Secondary | ICD-10-CM | POA: Insufficient documentation

## 2022-07-21 DIAGNOSIS — F209 Schizophrenia, unspecified: Secondary | ICD-10-CM | POA: Insufficient documentation

## 2022-07-21 DIAGNOSIS — R440 Auditory hallucinations: Secondary | ICD-10-CM

## 2022-07-21 DIAGNOSIS — R45851 Suicidal ideations: Secondary | ICD-10-CM

## 2022-07-21 DIAGNOSIS — Z79899 Other long term (current) drug therapy: Secondary | ICD-10-CM | POA: Insufficient documentation

## 2022-07-21 DIAGNOSIS — T426X6A Underdosing of other antiepileptic and sedative-hypnotic drugs, initial encounter: Secondary | ICD-10-CM | POA: Insufficient documentation

## 2022-07-21 DIAGNOSIS — Z91148 Patient's other noncompliance with medication regimen for other reason: Secondary | ICD-10-CM | POA: Insufficient documentation

## 2022-07-21 LAB — COMPREHENSIVE METABOLIC PANEL, NON-FASTING
ALBUMIN/GLOBULIN RATIO: 1.2 (ref 0.8–1.4)
ALBUMIN: 4 g/dL (ref 3.4–5.0)
ALKALINE PHOSPHATASE: 90 U/L (ref 46–116)
ALT (SGPT): 79 U/L — ABNORMAL HIGH (ref <=78)
ANION GAP: 12 mmol/L (ref 4–13)
AST (SGOT): 32 U/L (ref 15–37)
BILIRUBIN TOTAL: 0.5 mg/dL (ref 0.2–1.0)
BUN/CREA RATIO: 13
BUN: 15 mg/dL (ref 7–18)
CALCIUM, CORRECTED: 9.4 mg/dL
CALCIUM: 9.4 mg/dL (ref 8.5–10.1)
CHLORIDE: 105 mmol/L (ref 98–107)
CO2 TOTAL: 25 mmol/L (ref 21–32)
CREATININE: 1.12 mg/dL (ref 0.70–1.30)
ESTIMATED GFR: 88 mL/min/{1.73_m2} (ref >59)
GLOBULIN: 3.3
GLUCOSE: 113 mg/dL — ABNORMAL HIGH (ref 74–106)
OSMOLALITY, CALCULATED: 285 mOsm/kg (ref 270–290)
POTASSIUM: 3.6 mmol/L (ref 3.5–5.1)
PROTEIN TOTAL: 7.3 g/dL (ref 6.4–8.2)
SODIUM: 142 mmol/L (ref 136–145)

## 2022-07-21 LAB — CBC WITH DIFF
BASOPHIL #: 0.02 10*3/uL (ref 0.00–0.30)
BASOPHIL %: 0 % (ref 0–3)
EOSINOPHIL #: 0.04 10*3/uL (ref 0.00–0.80)
EOSINOPHIL %: 1 % (ref 0–7)
HCT: 42.3 % (ref 42.0–51.0)
HGB: 14.2 g/dL (ref 13.5–18.0)
LYMPHOCYTE #: 2.3 10*3/uL (ref 1.10–5.00)
LYMPHOCYTE %: 34 % (ref 25–45)
MCH: 29.8 pg (ref 27.0–32.0)
MCHC: 33.5 g/dL (ref 32.0–36.0)
MCV: 89 fL (ref 78.0–99.0)
MONOCYTE #: 0.49 10*3/uL (ref 0.00–1.30)
MONOCYTE %: 7 % (ref 0–12)
MPV: 8.9 fL (ref 7.4–10.4)
NEUTROPHIL #: 4 10*3/uL (ref 1.80–8.40)
NEUTROPHIL %: 58 % (ref 40–76)
PLATELETS: 254 10*3/uL (ref 140–440)
RBC: 4.75 10*6/uL (ref 4.20–6.00)
RDW: 15.6 % — ABNORMAL HIGH (ref 11.6–14.8)
WBC: 6.9 10*3/uL (ref 4.0–10.5)

## 2022-07-21 LAB — DRUG SCREEN, NO CONFIRMATION, URINE
AMPHETAMINES URINE: NEGATIVE
BARBITURATES URINE: NEGATIVE
BENZODIAZEPINES URINE: NEGATIVE
CANNABINOIDS URINE: NEGATIVE
COCAINE METABOLITES URINE: NEGATIVE
METHADONE URINE: NEGATIVE
OPIATES URINE: NEGATIVE
PCP URINE: NEGATIVE

## 2022-07-21 LAB — URINALYSIS, MACRO/MICRO
BILIRUBIN: NEGATIVE mg/dL
BLOOD: NEGATIVE mg/dL
GLUCOSE: NEGATIVE mg/dL
KETONES: NEGATIVE mg/dL
LEUKOCYTES: NEGATIVE WBCs/uL
NITRITE: NEGATIVE
PH: 6 (ref 4.6–8.0)
PROTEIN: NEGATIVE mg/dL
SPECIFIC GRAVITY: >=1.03 (ref 1.003–1.035)
UROBILINOGEN: 0.2 mg/dL (ref 0.2–1.0)

## 2022-07-21 LAB — COVID-19, FLU A/B, RSV RAPID BY PCR
INFLUENZA VIRUS TYPE A: NOT DETECTED
INFLUENZA VIRUS TYPE B: NOT DETECTED
RESPIRATORY SYNCTIAL VIRUS (RSV): NOT DETECTED
SARS-CoV-2: NOT DETECTED

## 2022-07-21 LAB — LITHIUM LEVEL: LITHIUM LEVEL: <0.1 mmol/L — ABNORMAL LOW (ref 1.00–1.20)

## 2022-07-21 LAB — ACETAMINOPHEN LEVEL: ACETAMINOPHEN LEVEL: 0

## 2022-07-21 LAB — SALICYLATE ACID LEVEL: SALICYLATE LEVEL: 1 mg/dL — ABNORMAL LOW (ref 3–20)

## 2022-07-21 LAB — ETHANOL, SERUM/PLASMA: ETHANOL: <3 mg/dL (ref <=3)

## 2022-07-21 NOTE — ED Provider Notes (Signed)
[facility] Emergency Department  ED Primary Provider Note  HPI:  Donald Atkins is a 37 y.o. male     Patient complains of worsening auditory hallucinations urging him to kill himself.  Denies visual or tactile hallucinations.  He has not attempted to harm himself.  He denies prescription recreational drug overdose attempts.  He denies access to weapons.  He states he has been not taking his medications as he feels like his Depakote as given him abdominal pain.  His abdominal pain is minimal at this point today.  History somewhat limited by medical condition.  Review of chart including most recent admission/discharge summary 05/2022 shows patient has a long history of schizophrenia with suicidal ideations and auditory hallucinations.  He has been to multiple facilities.  He has a legal guardian in [place].    ROS review and negative aside from stated in HPI.    Physical Exam:  ED Triage Vitals [07/21/22 2042]   BP (Non-Invasive) 134/79   Heart Rate 94   Respiratory Rate 18   Temperature 36.3 C (97.4 F)   SpO2 95 %   Weight 88 kg (194 lb)   Height 1.854 m (6' 1"$ )     No acute distress.  Patient awake alert oriented x3.  Depressed mood  Pupils 3 mm equal round reactive.  Extraocular movements are intact.  Oropharynx is clear.  Mucous membranes moist.  Trachea midline.  Neck is supple.  Heart has regular rate and rhythm without significant murmurs rubs or gallops.  Lungs are clear to auscultation.  Abdomen soft nontender, nondistended.  Moving all extremities without difficulty.  No rash no edema.      Patient data:  Labs Ordered/Reviewed   COMPREHENSIVE METABOLIC PANEL, NON-FASTING - Abnormal; Notable for the following components:       Result Value    GLUCOSE 113 (*)     ALT (SGPT) 79 (*)     All other components within normal limits    Narrative:     Estimated Glomerular Filtration Rate (eGFR) is calculated using the CKD-EPI (2021) equation,  intended for patients 48 years of age and older. If gender is not documented or "unknown", there will be no eGFR calculation.   SALICYLATE ACID LEVEL - Abnormal; Notable for the following components:    SALICYLATE LEVEL 1 (*)     All other components within normal limits   CBC WITH DIFF - Abnormal; Notable for the following components:    RDW 15.6 (*)     All other components within normal limits   URINALYSIS, MACRO/MICRO - Abnormal; Notable for the following components:    COLOR Light Yellow (*)     All other components within normal limits   LITHIUM LEVEL - Abnormal; Notable for the following components:    LITHIUM LEVEL <0.10 (*)     All other components within normal limits   VALPROIC ACID LEVEL - Abnormal; Notable for the following components:    VALPROIC ACID LEVEL <10.0 (*)     All other components within normal limits   COVID-19, FLU A/B, RSV RAPID BY PCR - Normal    Narrative:     Results are for the simultaneous qualitative identification of SARS-CoV-2 (formerly 2019-nCoV), Influenza A, Influenza B, and RSV RNA. These etiologic agents are generally detectable in nasopharyngeal and nasal swabs during the ACUTE PHASE of infection. Hence, this test is intended to be performed on respiratory specimens collected from individuals with signs and  symptoms of upper respiratory tract infection who meet Centers for Disease Control and Prevention (CDC) clinical and/or epidemiological criteria for Coronavirus Disease 2019 (COVID-19) testing. CDC COVID-19 criteria for testing on human specimens is available at [name] webpage information for Healthcare Professionals: Coronavirus Disease 2019 (COVID-19) ([employer]uk).     False-negative results may occur if the virus has genomic mutations, insertions, deletions, or rearrangements or if performed very early in the course of illness. Otherwise, negative results indicate virus specific RNA targets are not detected, however negative  results do not preclude SARS-CoV-2 infection/COVID-19, Influenza, or Respiratory syncytial virus infection. Results should not be used as the sole basis for patient management decisions. Negative results must be combined with clinical observations, patient history, and epidemiological information. If upper respiratory tract infection is still suspected based on exposure history together with other clinical findings, re-testing should be considered.    Disclaimer:   This assay has been authorized by FDA under an Emergency Use Authorization for use in laboratories certified under the Clinical Laboratory Improvement Amendments of 1988 (CLIA), 42 U.S.C. [phone], to perform high complexity tests. The impacts of vaccines, antiviral therapeutics, antibiotics, chemotherapeutic or immunosuppressant drugs have not been evaluated.     Test methodology:   Cepheid Xpert Xpress SARS-CoV-2/Flu/RSV Assay real-time polymerase chain reaction (RT-PCR) test on the GeneXpert Dx and Xpert Xpress systems.   ETHANOL, SERUM/PLASMA - Normal   DRUG SCREEN, NO CONFIRMATION, URINE - Normal   ACETAMINOPHEN LEVEL - Normal   CBC/DIFF    Narrative:     The following orders were created for panel order CBC/DIFF.  Procedure                               Abnormality         Status                     ---------                               -----------         ------                     CBC WITH [id number]                Abnormal            Final result                 Please view results for these tests on the individual orders.   URINALYSIS WITH REFLEX MICROSCOPIC AND CULTURE IF POSITIVE    Narrative:     The following orders were created for panel order URINALYSIS WITH REFLEX MICROSCOPIC AND CULTURE IF POSITIVE.  Procedure                               Abnormality         Status                     ---------                               -----------         ------  URINALYSIS, MACRO/MICRO[[id number]]      Abnormal             Final result                 Please view results for these tests on the individual orders.     No orders to display       Risk Factors:    Anxiety (worry, apprehension, panic) and Living situation problem  Protective Factors:    no access to firearms, insight into illness/situation  Suicide Inquiry:    Thoughts:   ideations--no plan   Plans/Behaviors/Past attempts:  Foreign body ingestion  Intent:   None    Risk Level:     Based on my assessment, the patient is a moderate risk of suicide     Intervention:     MODERATE RISK: Patient has been found to be MODERATE RISK for suicide.  Admission may be necessary depending on risk factors. Develop crisis plan for patient on clinical findings. Behavorial health referral given. Patient given emergency/crisis numbers.    Documentation  Given the above risk level the following plan of care was developed:    Admission is required due to mental illness suicidality  1:1 observation was required due to moderate risk of suicide  Medication management - unchanged.   Behavioral health referral was offered and was accepted by the patient.    MDM:  Auditory hallucinations suicidal ideations.  Medication noncompliance maybe contributory.  Vitals here within normal limits.  Patient placed on one-to-one observation.  In terms of lab work lithium and valproic acid levels were virtually 0.  No other acute abnormality noted.  He is medically cleared and appropriate for further psychiatric evaluation.        Psych Facility  Clinical Impression   Auditory hallucinations (Primary)   Noncompliance with medication regimen   Suicidal ideation           Current Discharge Medication List        CONTINUE these medications - NO CHANGES were made during your visit.        Details   benztropine 1 mg Tablet  Commonly known as: COGENTIN   2 mg, Oral, 2 TIMES DAILY  Refills: 0     divalproex 500 mg Tablet, Delayed Release (E.C.)  Commonly known as: DEPAKOTE   500 mg, Oral, 2 TIMES DAILY  Refills: 0      haloperidoL 10 mg Tablet  Commonly known as: HALDOL   10 mg, Oral, 2 TIMES DAILY  Refills: 0     omeprazole 20 mg Capsule, Delayed Release(E.C.)  Commonly known as: PRILOSEC   20 mg, Oral, DAILY  Refills: 0     QUEtiapine 100 mg Tablet  Commonly known as: SEROquel   100 mg, Oral, 2 TIMES DAILY  Refills: 0

## 2022-07-21 NOTE — ED Nurses Note (Signed)
[facility] called and stated that they are unable to take the patient d/t him having auditory hallucinations and they feel he would need a locked unit.

## 2022-07-21 NOTE — ED Triage Notes (Signed)
EMS reports PD called them for AMS, Pt found to have hallucinations . EMS reports calm and cooperative. VSS en route

## 2022-07-21 NOTE — ED Nurses Note (Signed)
[facility] four times and no answer if hey have beds or not. Will call again.

## 2022-07-21 NOTE — ED Nurses Note (Signed)
IN ER 9 with suicidal ideations and auditory hallucinations.  Plan of "running in front of a car".  States he hears voices telling him to kill himself.  Has a hx of similar episodes and has been hospitalized several times.  Calm and cooperative at this time.  Suicide precautions in place.  Belongings secured in locker #1.

## 2022-07-22 LAB — VALPROIC ACID LEVEL: VALPROIC ACID LEVEL: <10 ug/mL — ABNORMAL LOW (ref 50.0–100.0)

## 2022-07-22 MED ORDER — HYDROXYZINE PAMOATE 25 MG CAPSULE
ORAL_CAPSULE | ORAL | Status: AC
Start: 2022-07-22 — End: 2022-07-22
  Filled 2022-07-22: qty 1

## 2022-07-22 MED ORDER — HYDROXYZINE PAMOATE 25 MG CAPSULE
25.0000 mg | ORAL_CAPSULE | ORAL | Status: AC
Start: 2022-07-22 — End: 2022-07-22
  Administered 2022-07-22: 25 mg via ORAL

## 2022-07-22 NOTE — ED Attending Handoff Note (Signed)
[facility], [facility] Emergency Department  Emergency Department  Course Note    Care/report received from Dr. Nelson Chimes, Edwena Blow, MD at  07:44.  Per report:  Donald Atkins is a 37 y.o. male who had concerns including Hallucinations.  Patient does have a history schizophrenia with suicidal ideations and auditory hallucinations.  Patient states that he is again having auditory hallucinations urging him to kill himself.  Patient denies any attempts by him thus far.  Patient has been admitted multiple facilities in the past.  Patient is refusing to go to [place].  Patient stopped taking his valproic acid months ago because he stated that caused abdominal pain.    Pending labs/imaging/consults:  Patient's valproic level was less than 10.  Patient's lithium level as also less than 0.1.  His for Plex was normal.  Patient denies using alcohol or street drugs.  Plan:  Plan is to trying get patient admitted to another psych facility.  He was expecting to go to [facility] but they do not have bed.    Course:  Patient has been quiet lying on the stretcher in the room and has not voiced any complaints at this time.     Based on the history, physical exam, and tests obtained as indicated, the Emergency Department evaluation has identified no acute or active medical issues that warrant further emergency department intervention or precludes discharge of the patient in the company of law enforcement or transfer to a psychiatric facility.  I spoke to [facility], Teacher, music, at [facility] with the [place] system.  Awaiting transport to take the patient to the psychiatric facility.    Disposition: Psych Facility          Clinical Impression   Auditory hallucinations (Primary)   Noncompliance with medication regimen   Suicidal ideation         Ammie Ferrier, MD

## 2022-07-22 NOTE — ED Nurses Note (Signed)
[facility] said they never got our fax and then said that they do not have a bed.  [facility] said the patient is the second person in the que to be interviewed.

## 2022-07-22 NOTE — ED Nurses Note (Signed)
Resting quietly even unlabored respiration blanket over his head

## 2022-07-22 NOTE — ED Nurses Note (Signed)
[facility] again @0026$  still no answer from the crisis worker if they have beds or not. Did talk to the supervisor and he just said to try again calling back and gave the extension to the crisis number.

## 2022-07-22 NOTE — ED Nurses Note (Signed)
Called [facility] @0120$  they have beds and faxed over paperwork to them.

## 2022-07-22 NOTE — ED Nurses Note (Signed)
Awake alert ate breakfast  updated on plan  to possibly go to [facility] awaiting approval

## 2022-07-22 NOTE — ED Nurses Note (Signed)
Patient is refusing to go to the [place] [facility].  [facility] @0104$  they are full  [facility] @0105$  they have no beds, maybe have some beds in the morning.  [facility] @0106$  they have one bed and must be placed in a room with someone else.  Faxing over paperwork now.

## 2022-07-22 NOTE — ED Nurses Note (Signed)
Called the [facility] @0038$  gave information on the patient.
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
## 2022-11-04 ENCOUNTER — Other Ambulatory Visit: Payer: Self-pay

## 2022-11-04 ENCOUNTER — Emergency Department
Admission: EM | Admit: 2022-11-04 | Discharge: 2022-11-05 | Disposition: A | Discharge status: Home / Self Care | Payer: MEDICAID | Attending: Physician Assistant | Admitting: Physician Assistant

## 2022-11-04 ENCOUNTER — Emergency Department (HOSPITAL_BASED_OUTPATIENT_CLINIC_OR_DEPARTMENT_OTHER): Payer: MEDICAID

## 2022-11-04 DIAGNOSIS — Z91148 Patient's other noncompliance with medication regimen for other reason: Secondary | ICD-10-CM | POA: Insufficient documentation

## 2022-11-04 DIAGNOSIS — Y92009 Unspecified place in unspecified non-institutional (private) residence as the place of occurrence of the external cause: Secondary | ICD-10-CM

## 2022-11-04 DIAGNOSIS — S46912A Strain of unspecified muscle, fascia and tendon at shoulder and upper arm level, left arm, initial encounter: Secondary | ICD-10-CM

## 2022-11-04 NOTE — ED Nurses Note (Signed)
Pt reports he currently feels safe and denies SI/ HI . Pt does state he does not want to go back to his step fathers home because he feels unsafe. Provider in to see pt at this time

## 2022-11-04 NOTE — ED Nurses Note (Signed)
Pt given sandwich tray , drinks. Encouraged pt to voice any concerns or needs throughout stay .call bell placed within reach . Pt made aware case management order has been placed by provider.
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
## 2022-11-05 ENCOUNTER — Encounter (HOSPITAL_BASED_OUTPATIENT_CLINIC_OR_DEPARTMENT_OTHER): Payer: Self-pay | Admitting: FAMILY PRACTICE

## 2022-11-05 LAB — DRUG SCREEN, NO CONFIRMATION, URINE
AMPHETAMINES URINE: NEGATIVE
BARBITURATES URINE: NEGATIVE
BENZODIAZEPINES URINE: NEGATIVE
CANNABINOIDS URINE: NEGATIVE
COCAINE METABOLITES URINE: NEGATIVE
METHADONE URINE: NEGATIVE
OPIATES URINE: NEGATIVE
PCP URINE: NEGATIVE

## 2022-11-05 LAB — URINALYSIS, MACRO/MICRO
BILIRUBIN: NEGATIVE mg/dL
BLOOD: NEGATIVE mg/dL
GLUCOSE: NEGATIVE mg/dL
KETONES: NEGATIVE mg/dL
LEUKOCYTES: NEGATIVE WBCs/uL
NITRITE: NEGATIVE
PH: 6 (ref 4.6–8.0)
PROTEIN: NEGATIVE mg/dL
SPECIFIC GRAVITY: <=1.005 (ref 1.003–1.035)
UROBILINOGEN: 1 mg/dL (ref 0.2–1.0)

## 2022-11-05 LAB — CBC WITH DIFF
BASOPHIL #: 0.02 10*3/uL (ref 0.00–0.30)
BASOPHIL %: 0 % (ref 0–3)
EOSINOPHIL #: 0.06 10*3/uL (ref 0.00–0.80)
EOSINOPHIL %: 1 % (ref 0–7)
HCT: 43.6 % (ref 42.0–51.0)
HGB: 14.7 g/dL (ref 13.5–18.0)
LYMPHOCYTE #: 1.5 10*3/uL (ref 1.10–5.00)
LYMPHOCYTE %: 30 % (ref 25–45)
MCH: 29.7 pg (ref 27.0–32.0)
MCHC: 33.6 g/dL (ref 32.0–36.0)
MCV: 88.2 fL (ref 78.0–99.0)
MONOCYTE #: 0.33 10*3/uL (ref 0.00–1.30)
MONOCYTE %: 7 % (ref 0–12)
MPV: 8.1 fL (ref 7.4–10.4)
NEUTROPHIL #: 3.15 10*3/uL (ref 1.80–8.40)
NEUTROPHIL %: 62 % (ref 40–76)
PLATELETS: 247 10*3/uL (ref 140–440)
RBC: 4.94 10*6/uL (ref 4.20–6.00)
RDW: 16.1 % — ABNORMAL HIGH (ref 11.6–14.8)
WBC: 5.1 10*3/uL (ref 4.0–10.5)

## 2022-11-05 LAB — COMPREHENSIVE METABOLIC PANEL, NON-FASTING
ALBUMIN/GLOBULIN RATIO: 1.1 (ref 0.8–1.4)
ALBUMIN: 4 g/dL (ref 3.4–5.0)
ALKALINE PHOSPHATASE: 103 U/L (ref 46–116)
ALT (SGPT): 83 U/L — ABNORMAL HIGH (ref <=78)
ANION GAP: 11 mmol/L (ref 4–13)
AST (SGOT): 35 U/L (ref 15–37)
BILIRUBIN TOTAL: 1 mg/dL (ref 0.2–1.0)
BUN/CREA RATIO: 11
BUN: 11 mg/dL (ref 7–18)
CALCIUM, CORRECTED: 9 mg/dL
CALCIUM: 9 mg/dL (ref 8.5–10.1)
CHLORIDE: 106 mmol/L (ref 98–107)
CO2 TOTAL: 24 mmol/L (ref 21–32)
CREATININE: 0.98 mg/dL (ref 0.70–1.30)
ESTIMATED GFR: 102 mL/min/{1.73_m2} (ref >59)
GLOBULIN: 3.7
GLUCOSE: 103 mg/dL (ref 74–106)
OSMOLALITY, CALCULATED: 281 mOsm/kg (ref 270–290)
POTASSIUM: 3.8 mmol/L (ref 3.5–5.1)
PROTEIN TOTAL: 7.7 g/dL (ref 6.4–8.2)
SODIUM: 141 mmol/L (ref 136–145)

## 2022-11-05 LAB — ACETAMINOPHEN LEVEL: ACETAMINOPHEN LEVEL: 0

## 2022-11-05 LAB — ETHANOL, SERUM/PLASMA: ETHANOL: <3 mg/dL (ref <=3)

## 2022-11-05 LAB — VALPROIC ACID LEVEL: VALPROIC ACID LEVEL: <10 ug/mL — ABNORMAL LOW (ref 50.0–100.0)

## 2022-11-05 LAB — LITHIUM LEVEL: LITHIUM LEVEL: <0.1 mmol/L — ABNORMAL LOW (ref 1.00–1.20)

## 2022-11-05 LAB — SALICYLATE ACID LEVEL: SALICYLATE LEVEL: 0 mg/dL — ABNORMAL LOW (ref 3–20)

## 2022-11-05 NOTE — ED Nurses Note (Signed)
Pt alert and oriented stating he is not homicidal or suicidal just wants to be discharged.

## 2022-11-05 NOTE — ED Nurses Note (Signed)
Assumed pt care at this time. Waiting to talk to case manager in [place] for suggestions to discharge this pt to since he has no where to go. Resting with eyes closed. No distress noted.

## 2022-11-05 NOTE — ED Nurses Note (Signed)
Pt refused to be admitted to any facility. States the phone numbers I had for his family were wrong and they both live out of state now.

## 2022-11-05 NOTE — ED Nurses Note (Signed)
Left ambulatory and verbalized understanding of instructions. Alert and oriented, denies any pain, denies suicide or homicidal ideations.

## 2022-11-05 NOTE — ED Nurses Note (Signed)
Labs drawn and urine collected

## 2022-11-05 NOTE — ED Nurses Note (Signed)
Pt resting on stretcher , NAD noted. Pt remains calm . Pt denies any needs or concerns at this tie time. Pt encouraged to voice concerns/needs/fears at any time. Pt currently denies any pain.

## 2022-11-05 NOTE — ED Nurses Note (Signed)
Pt resting on stretcher with TV playing. Pt denies any needs or concerns at this time. Awaiting return call from legal guardian and/or CM eval

## 2022-11-05 NOTE — ED Nurses Note (Signed)
Asked pt if he would allow [place] to do a depakote level so we can start his home medications but he has refused.

## 2022-11-05 NOTE — ED Nurses Note (Signed)
Pt on the phone with [facility]

## 2022-11-05 NOTE — ED Nurses Note (Signed)
Spoke to the legal guardian and she said she lives out of state and is only the guardian through an agency, pt states he wants to leave, received the phone number of the sister Donald Atkins [phone] and Mother Donald Atkins [phone] but the pt states he does not want [place] to call them, he just wants to leave.

## 2022-11-05 NOTE — ED Attending Handoff Note (Signed)
MDM:    ED Course as of 11/05/22 0816   Sat Nov 05, 2022   4098 Patient resting comfortably on my re-evaluation.  He currently denies SI denies HI.  Not actively hallucinating He was not want assistance with restarting home medications.  Declined possible inpatient mental health hospitalization.   No acute events overnight.  Plan is for social work consult in the morning to help ensure safe discharge.      Discharged  Clinical Impression   Strain of left shoulder, initial encounter (Primary)   Noncompliance with medication regimen           Current Discharge Medication List        CONTINUE these medications - NO CHANGES were made during your visit.        Details   benztropine 1 mg Tablet  Commonly known as: COGENTIN   2 mg, Oral, 2 TIMES DAILY  Refills: 0     divalproex 500 mg Tablet, Delayed Release (E.C.)  Commonly known as: DEPAKOTE   500 mg, Oral, 2 TIMES DAILY  Refills: 0     haloperidoL 10 mg Tablet  Commonly known as: HALDOL   10 mg, Oral, 2 TIMES DAILY  Refills: 0     omeprazole 20 mg Capsule, Delayed Release(E.C.)  Commonly known as: PRILOSEC   20 mg, Oral, DAILY  Refills: 0     QUEtiapine 100 mg Tablet  Commonly known as: SEROquel   100 mg, Oral, 2 TIMES DAILY  Refills: 0

## 2022-12-03 ENCOUNTER — Emergency Department
Admission: EM | Admit: 2022-12-03 | Discharge: 2022-12-03 | Disposition: A | Discharge status: Home / Self Care | Payer: MEDICAID | Source: Home / Self Care | Attending: Emergency Medicine | Admitting: Emergency Medicine

## 2022-12-03 ENCOUNTER — Other Ambulatory Visit: Payer: Self-pay

## 2022-12-03 DIAGNOSIS — F209 Schizophrenia, unspecified: Secondary | ICD-10-CM | POA: Insufficient documentation

## 2022-12-03 DIAGNOSIS — F419 Anxiety disorder, unspecified: Secondary | ICD-10-CM | POA: Insufficient documentation

## 2022-12-03 DIAGNOSIS — Z79899 Other long term (current) drug therapy: Secondary | ICD-10-CM | POA: Insufficient documentation

## 2022-12-03 LAB — DRUG SCREEN, NO CONFIRMATION, URINE
AMPHETAMINES URINE: NEGATIVE
BARBITURATES URINE: NEGATIVE
BENZODIAZEPINES URINE: NEGATIVE
CANNABINOIDS URINE: NEGATIVE
COCAINE METABOLITES URINE: NEGATIVE
METHADONE URINE: NEGATIVE
OPIATES URINE: NEGATIVE
PCP URINE: NEGATIVE

## 2022-12-03 LAB — CBC WITH DIFF
BASOPHIL #: 0.02 10*3/uL (ref 0.00–0.30)
BASOPHIL %: 0 % (ref 0–3)
EOSINOPHIL #: 0.04 10*3/uL (ref 0.00–0.80)
EOSINOPHIL %: 1 % (ref 0–7)
HCT: 42.6 % (ref 42.0–51.0)
HGB: 14.3 g/dL (ref 13.5–18.0)
LYMPHOCYTE #: 1.65 10*3/uL (ref 1.10–5.00)
LYMPHOCYTE %: 28 % (ref 25–45)
MCH: 29.6 pg (ref 27.0–32.0)
MCHC: 33.5 g/dL (ref 32.0–36.0)
MCV: 88.4 fL (ref 78.0–99.0)
MONOCYTE #: 0.3 10*3/uL (ref 0.00–1.30)
MONOCYTE %: 5 % (ref 0–12)
MPV: 7.7 fL (ref 7.4–10.4)
NEUTROPHIL #: 3.87 10*3/uL (ref 1.80–8.40)
NEUTROPHIL %: 66 % (ref 40–76)
PLATELETS: 236 10*3/uL (ref 140–440)
RBC: 4.82 10*6/uL (ref 4.20–6.00)
RDW: 15.3 % — ABNORMAL HIGH (ref 11.6–14.8)
WBC: 5.9 10*3/uL (ref 4.0–10.5)

## 2022-12-03 LAB — URINALYSIS, MACRO/MICRO
BILIRUBIN: NEGATIVE mg/dL
BLOOD: NEGATIVE mg/dL
GLUCOSE: NEGATIVE mg/dL
KETONES: NEGATIVE mg/dL
LEUKOCYTES: NEGATIVE WBCs/uL
NITRITE: NEGATIVE
PH: 6 (ref 4.6–8.0)
PROTEIN: NEGATIVE mg/dL
SPECIFIC GRAVITY: 1.025 (ref 1.003–1.035)
UROBILINOGEN: 0.2 mg/dL (ref 0.2–1.0)

## 2022-12-03 LAB — COMPREHENSIVE METABOLIC PANEL, NON-FASTING
ALBUMIN/GLOBULIN RATIO: 1.1 (ref 0.8–1.4)
ALBUMIN: 4.1 g/dL (ref 3.4–5.0)
ALKALINE PHOSPHATASE: 115 U/L (ref 46–116)
ALT (SGPT): 82 U/L — ABNORMAL HIGH (ref <=78)
ANION GAP: 12 mmol/L (ref 4–13)
AST (SGOT): 34 U/L (ref 15–37)
BILIRUBIN TOTAL: 0.6 mg/dL (ref 0.2–1.0)
BUN/CREA RATIO: 10
BUN: 10 mg/dL (ref 7–18)
CALCIUM, CORRECTED: 8.9 mg/dL
CALCIUM: 9 mg/dL (ref 8.5–10.1)
CHLORIDE: 104 mmol/L (ref 98–107)
CO2 TOTAL: 25 mmol/L (ref 21–32)
CREATININE: 1 mg/dL (ref 0.70–1.30)
ESTIMATED GFR: 100 mL/min/{1.73_m2} (ref >59)
GLOBULIN: 3.7
GLUCOSE: 116 mg/dL — ABNORMAL HIGH (ref 74–106)
OSMOLALITY, CALCULATED: 281 mOsm/kg (ref 270–290)
POTASSIUM: 3.9 mmol/L (ref 3.5–5.1)
PROTEIN TOTAL: 7.8 g/dL (ref 6.4–8.2)
SODIUM: 141 mmol/L (ref 136–145)

## 2022-12-03 LAB — ETHANOL, SERUM/PLASMA: ETHANOL: <3 mg/dL (ref <=3)

## 2022-12-03 LAB — SALICYLATE ACID LEVEL: SALICYLATE LEVEL: 0 mg/dL — ABNORMAL LOW (ref 3–20)

## 2022-12-03 LAB — ACETAMINOPHEN LEVEL: ACETAMINOPHEN LEVEL: 0 ug/mL — ABNORMAL LOW (ref 10.0–30.0)

## 2022-12-03 NOTE — Discharge Instructions (Signed)
PLEASE FOLLOW-UP WITH YOUR THERAPIST AT [facility]
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
## 2022-12-03 NOTE — ED Triage Notes (Signed)
Pt reports auditory hallucinations over the past couple of days. Pt is currently out of schizophrenia medications . Denies Homicidal or suicidal ideations

## 2022-12-03 NOTE — ED Nurses Note (Signed)
Physician cleared patient and patient ambulated out of er.
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
## 2022-12-03 NOTE — ED Nurses Note (Signed)
Pt awake laying in bed at this time. Respirations even and unlabored w/ no s/s of acute distress noted at this time.
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
## 2022-12-03 NOTE — ED Provider Notes (Addendum)
[facility] emergency department         HISTORY OF PRESENT ILLNESS     Date:  12/03/2022  Patient's Name:  Donald Atkins  Date of Birth:  20-Jul-1985    PATIENT IS A 37-YEAR-OLD PRESENTING WITH AUDITORY HALLUCINATIONS FOR THE LAST 3-4 DAYS.  PATIENT HAS A HISTORY OF BIPOLAR DISORDER SCHIZOPHRENIA AND ANXIETY DISORDER.  PATIENT STATES HE HAS BEEN OUT OF HIS PSYCHIATRIC MEDICATIONS FOR THE PAST WEEK AND HAS NOTICED WORSENING AUDITORY HALLUCINATIONS.  STATES HE IS VERY ANXIOUS NERVOUS.  PATIENT DENIES ANY SUICIDAL HOMICIDAL THOUGHTS.  PATIENT STATES HE WOULD LIKE TO GO TO [facility] WHERE HIS PSYCHIATRY CARE IS PRESENTLY OUTPATIENT.        Review of Systems     Review of Systems   Constitutional: Negative.    HENT: Negative.     Respiratory: Negative.     Cardiovascular: Negative.    Gastrointestinal: Negative.    Psychiatric/Behavioral:  Positive for behavioral problems and hallucinations. The patient is nervous/anxious.    All other systems reviewed and are negative.      Previous History     Past Medical History:  Past Medical History:   Diagnosis Date    Bipolar disorder, unspecified (CMS HCC)     Borderline intellectual functioning     Convulsions (CMS HCC)     Convulsions (CMS HCC)     GAD (generalized anxiety disorder)     GERD (gastroesophageal reflux disease)     Mixed hyperlipidemia     Schizoaffective disorder (CMS HCC)     Schizophrenia (CMS HCC)        Past Surgical History:  No past surgical history on file.    Social History:  Social History     Tobacco Use    Smoking status: Never     Passive exposure: Never    Smokeless tobacco: Never   Vaping Use    Vaping status: Never Used   Substance Use Topics    Alcohol use: Never    Drug use: Never     Social History     Substance and Sexual Activity   Drug Use Never       Family History:  No family history on file.    Medication History:  Current Outpatient Medications   Medication Sig    benztropine (COGENTIN)  1 mg Oral Tablet Take 2 Tablets (2 mg total) by mouth Twice daily    divalproex (DEPAKOTE) 500 mg Oral Tablet, Delayed Release (E.C.) Take 1 Tablet (500 mg total) by mouth Twice daily    haloperidoL (HALDOL) 10 mg Oral Tablet Take 1 Tablet (10 mg total) by mouth Twice daily    omeprazole (PRILOSEC) 20 mg Oral Capsule, Delayed Release(E.C.) Take 1 Capsule (20 mg total) by mouth Once a day    QUEtiapine (SEROQUEL) 100 mg Oral Tablet Take 1 Tablet (100 mg total) by mouth Twice daily       Allergies:  Allergies   Allergen Reactions    Latex     Penicillins Anaphylaxis    Latex        Physical Exam     Vitals:    BP (!) 134/91   Pulse 78   Temp 36.8 C (98.2 F)   Resp 18   Ht 1.854 m (6\' 1" )   Wt 83.9 kg (185 lb)   SpO2 99%   BMI 24.41 kg/m           Physical Exam  Vitals and nursing note reviewed.   Constitutional:       General: He is not in acute distress.     Appearance: He is well-developed.   HENT:      Head: Normocephalic and atraumatic.      Right Ear: Tympanic membrane normal.      Left Ear: Tympanic membrane normal.      Nose: Nose normal.      Mouth/Throat:      Mouth: Mucous membranes are moist.   Eyes:      Extraocular Movements: Extraocular movements intact.      Conjunctiva/sclera: Conjunctivae normal.      Pupils: Pupils are equal, round, and reactive to light.   Cardiovascular:      Rate and Rhythm: Normal rate and regular rhythm.      Heart sounds: No murmur heard.  Pulmonary:      Effort: Pulmonary effort is normal. No respiratory distress.      Breath sounds: Normal breath sounds.   Abdominal:      General: Bowel sounds are normal.      Palpations: Abdomen is soft.      Tenderness: There is no abdominal tenderness.   Musculoskeletal:         General: No swelling. Normal range of motion.      Cervical back: Normal range of motion and neck supple.   Skin:     General: Skin is warm and dry.      Capillary Refill: Capillary refill takes less than 2 seconds.   Neurological:      General: No focal  deficit present.      Mental Status: He is alert and oriented to person, place, and time.   Psychiatric:         Mood and Affect: Mood normal.         Behavior: Behavior normal.         Thought Content: Thought content normal.         Judgment: Judgment normal.         Diagnostic Studies/Treatment     Medications:       New Prescriptions    No medications on file       Labs:    Results for orders placed or performed during the hospital encounter of 12/03/22 (from the past 12 hour(s))   COMPREHENSIVE METABOLIC PANEL, NON-FASTING   Result Value Ref Range    SODIUM 141 136 - 145 mmol/L    POTASSIUM 3.9 3.5 - 5.1 mmol/L    CHLORIDE 104 98 - 107 mmol/L    CO2 TOTAL 25 21 - 32 mmol/L    ANION GAP 12 4 - 13 mmol/L    BUN 10 7 - 18 mg/dL    CREATININE 9.14 7.82 - 1.30 mg/dL    BUN/CREA RATIO 10     ESTIMATED GFR 100 >59 mL/min/1.55m^2    ALBUMIN 4.1 3.4 - 5.0 g/dL    CALCIUM 9.0 8.5 - 95.6 mg/dL    GLUCOSE 213 (H) 74 - 106 mg/dL    ALKALINE PHOSPHATASE 115 46 - 116 U/L    ALT (SGPT) 82 (H) <=78 U/L    AST (SGOT) 34 15 - 37 U/L    BILIRUBIN TOTAL 0.6 0.2 - 1.0 mg/dL    PROTEIN TOTAL 7.8 6.4 - 8.2 g/dL    ALBUMIN/GLOBULIN RATIO 1.1 0.8 - 1.4    OSMOLALITY, CALCULATED 281 270 - 290 mOsm/kg    CALCIUM, CORRECTED 8.9 mg/dL  GLOBULIN 3.7    SALICYLATE ACID LEVEL   Result Value Ref Range    SALICYLATE LEVEL 0 (L) 3 - 20 mg/dL   ACETAMINOPHEN LEVEL   Result Value Ref Range    ACETAMINOPHEN LEVEL 0.0 (L) 10.0 - 30.0 ug/mL   ETHANOL, SERUM   Result Value Ref Range    ETHANOL <3 <=3 mg/dL   CBC WITH DIFF   Result Value Ref Range    WBC 5.9 4.0 - 10.5 x10^3/uL    RBC 4.82 4.20 - 6.00 x10^6/uL    HGB 14.3 13.5 - 18.0 g/dL    HCT 25.9 56.3 - 87.5 %    MCV 88.4 78.0 - 99.0 fL    MCH 29.6 27.0 - 32.0 pg    MCHC 33.5 32.0 - 36.0 g/dL    RDW 64.3 (H) 32.9 - 14.8 %    PLATELETS 236 140 - 440 x10^3/uL    MPV 7.7 7.4 - 10.4 fL    NEUTROPHIL % 66 40 - 76 %    LYMPHOCYTE % 28 25 - 45 %    MONOCYTE % 5 0 - 12 %    EOSINOPHIL % 1 0 - 7 %     BASOPHIL % 0 0 - 3 %    NEUTROPHIL # 3.87 1.80 - 8.40 x10^3/uL    LYMPHOCYTE # 1.65 1.10 - 5.00 x10^3/uL    MONOCYTE # 0.30 0.00 - 1.30 x10^3/uL    EOSINOPHIL # 0.04 0.00 - 0.80 x10^3/uL    BASOPHIL # 0.02 0.00 - 0.30 x10^3/uL   DRUG SCREEN, NO CONFIRMATION, URINE   Result Value Ref Range    AMPHETAMINES URINE Negative Negative    BARBITURATES URINE Negative Negative    BENZODIAZEPINES URINE Negative Negative    METHADONE URINE Negative Negative    COCAINE METABOLITES URINE Negative Negative    OPIATES URINE Negative Negative    PCP URINE Negative Negative    CANNABINOIDS URINE Negative Negative   URINALYSIS, MACRO/MICRO   Result Value Ref Range    COLOR Light Yellow (A) Yellow    APPEARANCE Clear Clear    SPECIFIC GRAVITY 1.025 1.003 - 1.035    PH 6.0 4.6 - 8.0    LEUKOCYTES Negative Negative WBCs/uL    NITRITE Negative Negative    PROTEIN Negative Negative mg/dL    GLUCOSE Negative Negative mg/dL    KETONES Negative Negative mg/dL    BILIRUBIN Negative Negative mg/dL    BLOOD Negative Negative mg/dL    UROBILINOGEN 0.2 0.2 - 1.0 mg/dL        Radiology:  None    No orders to display       ECG:  NONE            Differential diagnosis  1. SCHIZOPHRENIA EXACERBATION, BIPOLAR DISORDER, ACUTE PSYCHOSIS AUDITORY HALLUCINATION    Course/Disposition/Plan     Course:     ED Course as of 12/03/22 0733   Sat Dec 03, 2022   0342 PATIENT MEDICALLY CLEARED FOR PSYCHIATRY EVALUATION   0727 PATIENT REQUESTING TO BE DISCHARGED HOME HE IS NOT SUICIDAL OR HOMICIDAL DOES NOT WISH TO BE ADMITTED TO INPATIENT ANYMORE     PATIENT WILL BE MEDICALLY CLEARED FOR PSYCHIATRY EVALUATION NONCOMPLIANT WITH MEDICATION  Disposition:    Discharged    Condition at Disposition:   STABLE    Follow up:   [facility]  [address]  [place] [address]  Schedule an appointment as soon as possible for  a visit   OUTPATIENT FOLLOW-UP PSYCHIATRY      Clinical Impression:     Clinical Impression   Schizophrenia,  unspecified type (CMS HCC) (Primary)         Tivis Ringer, MD

## 2022-12-03 NOTE — ED Notes (Signed)
[facility]- does not take [employer]- no beds at this time, call back about 10 or 11am for discharges    [facility]- patient information faxed    [facility]- patient information faxed, patient on wait list.

## 2022-12-03 NOTE — ED Nurses Note (Signed)
Patient sitting in room.  Patient cooperative.

## 2023-02-05 ENCOUNTER — Emergency Department
Admission: EM | Admit: 2023-02-05 | Discharge: 2023-02-06 | Disposition: A | Discharge status: Home / Self Care | Payer: MEDICAID | Attending: Emergency Medicine | Admitting: Emergency Medicine

## 2023-02-05 ENCOUNTER — Other Ambulatory Visit: Payer: Self-pay

## 2023-02-05 ENCOUNTER — Emergency Department (HOSPITAL_BASED_OUTPATIENT_CLINIC_OR_DEPARTMENT_OTHER): Payer: MEDICAID

## 2023-02-05 ENCOUNTER — Encounter (HOSPITAL_BASED_OUTPATIENT_CLINIC_OR_DEPARTMENT_OTHER): Payer: Self-pay

## 2023-02-05 DIAGNOSIS — F319 Bipolar disorder, unspecified: Secondary | ICD-10-CM | POA: Insufficient documentation

## 2023-02-05 DIAGNOSIS — T443X2A Poisoning by other parasympatholytics [anticholinergics and antimuscarinics] and spasmolytics, intentional self-harm, initial encounter: Secondary | ICD-10-CM | POA: Insufficient documentation

## 2023-02-05 DIAGNOSIS — Z1152 Encounter for screening for COVID-19: Secondary | ICD-10-CM | POA: Insufficient documentation

## 2023-02-05 DIAGNOSIS — R451 Restlessness and agitation: Secondary | ICD-10-CM | POA: Insufficient documentation

## 2023-02-05 LAB — CBC WITH DIFF
BASOPHIL #: 0.03 10*3/uL (ref 0.00–0.30)
BASOPHIL %: 0 % (ref 0–3)
EOSINOPHIL #: 0.03 10*3/uL (ref 0.00–0.80)
EOSINOPHIL %: 0 % — ABNORMAL LOW (ref 1–8)
HCT: 44.2 % (ref 42.0–51.0)
HGB: 15 g/dL (ref 13.5–18.0)
LYMPHOCYTE #: 1.53 10*3/uL (ref 1.10–5.00)
LYMPHOCYTE %: 20 % — ABNORMAL LOW (ref 25–45)
MCH: 29.8 pg (ref 27.0–32.0)
MCHC: 33.9 g/dL (ref 32.0–36.0)
MCV: 88.1 fL (ref 78.0–99.0)
MONOCYTE #: 0.43 10*3/uL (ref 0.00–1.30)
MONOCYTE %: 6 % (ref 0–12)
MPV: 7.8 fL (ref 7.4–10.4)
NEUTROPHIL #: 5.79 10*3/uL (ref 1.80–8.40)
NEUTROPHIL %: 74 % (ref 40–76)
PLATELETS: 284 10*3/uL (ref 140–440)
RBC: 5.02 10*6/uL (ref 4.20–6.00)
RDW: 16.9 % — ABNORMAL HIGH (ref 11.6–14.8)
WBC: 7.8 10*3/uL (ref 4.0–10.5)

## 2023-02-05 MED ORDER — LACTATED RINGERS IV BOLUS
1000.0000 mL | INJECTION | Status: AC
Start: 2023-02-05 — End: 2023-02-05
  Administered 2023-02-05: 0 mL via INTRAVENOUS
  Administered 2023-02-05: 1000 mL via INTRAVENOUS

## 2023-02-05 NOTE — ED Nurses Note (Signed)
Called poison control to report OD, reports symptomatic management, benzo's if seizure. Observe time is 6 hours from ingestion. Will fax information
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
## 2023-02-06 ENCOUNTER — Emergency Department (HOSPITAL_BASED_OUTPATIENT_CLINIC_OR_DEPARTMENT_OTHER): Payer: MEDICAID

## 2023-02-06 DIAGNOSIS — Z79899 Other long term (current) drug therapy: Secondary | ICD-10-CM

## 2023-02-06 DIAGNOSIS — F319 Bipolar disorder, unspecified: Secondary | ICD-10-CM

## 2023-02-06 DIAGNOSIS — I499 Cardiac arrhythmia, unspecified: Secondary | ICD-10-CM

## 2023-02-06 LAB — ETHANOL, SERUM/PLASMA: ETHANOL: <3 mg/dL (ref <=3)

## 2023-02-06 LAB — COMPREHENSIVE METABOLIC PANEL, NON-FASTING
ALBUMIN/GLOBULIN RATIO: 1.1 (ref 0.8–1.4)
ALBUMIN: 4 g/dL (ref 3.4–5.0)
ALKALINE PHOSPHATASE: 112 U/L (ref 46–116)
ALT (SGPT): 49 U/L (ref <=78)
ANION GAP: 9 mmol/L (ref 4–13)
AST (SGOT): 22 U/L (ref 15–37)
BILIRUBIN TOTAL: 0.5 mg/dL (ref 0.2–1.0)
BUN/CREA RATIO: 7
BUN: 7 mg/dL (ref 7–18)
CALCIUM, CORRECTED: 9 mg/dL
CALCIUM: 9 mg/dL (ref 8.5–10.1)
CHLORIDE: 106 mmol/L (ref 98–107)
CO2 TOTAL: 27 mmol/L (ref 21–32)
CREATININE: 1.01 mg/dL (ref 0.70–1.30)
ESTIMATED GFR: 99 mL/min/{1.73_m2} (ref >59)
GLOBULIN: 3.5
GLUCOSE: 108 mg/dL — ABNORMAL HIGH (ref 74–106)
OSMOLALITY, CALCULATED: 282 mOsm/kg (ref 270–290)
POTASSIUM: 3.9 mmol/L (ref 3.5–5.1)
PROTEIN TOTAL: 7.5 g/dL (ref 6.4–8.2)
SODIUM: 142 mmol/L (ref 136–145)

## 2023-02-06 LAB — LACTIC ACID LEVEL W/ REFLEX FOR LEVEL >2.0: LACTIC ACID: 1.8 mmol/L (ref 0.4–2.0)

## 2023-02-06 LAB — DRUG SCREEN, NO CONFIRMATION, URINE
AMPHETAMINES URINE: NEGATIVE
BARBITURATES URINE: NEGATIVE
BENZODIAZEPINES URINE: NEGATIVE
CANNABINOIDS URINE: NEGATIVE
COCAINE METABOLITES URINE: NEGATIVE
METHADONE URINE: NEGATIVE
OPIATES URINE: NEGATIVE
PCP URINE: NEGATIVE

## 2023-02-06 LAB — COVID-19 ~~LOC~~ MOLECULAR LAB TESTING
INFLUENZA VIRUS TYPE A: NOT DETECTED
INFLUENZA VIRUS TYPE B: NOT DETECTED
RESPIRATORY SYNCTIAL VIRUS (RSV): NOT DETECTED
SARS-CoV-2: NOT DETECTED

## 2023-02-06 LAB — ECG 12 LEAD
Atrial Rate: 63 {beats}/min
Calculated P Axis: 17 degrees
Calculated R Axis: 49 degrees
Calculated T Axis: 35 degrees
PR Interval: 140 ms
QRS Duration: 94 ms
QT Interval: 376 ms
QTC Calculation: 384 ms
Ventricular rate: 63 {beats}/min

## 2023-02-06 LAB — URINALYSIS, MACRO/MICRO
BILIRUBIN: NEGATIVE mg/dL
BLOOD: NEGATIVE mg/dL
GLUCOSE: NEGATIVE mg/dL
KETONES: NEGATIVE mg/dL
LEUKOCYTES: NEGATIVE WBCs/uL
NITRITE: NEGATIVE
PH: 6 (ref 4.6–8.0)
PROTEIN: NEGATIVE mg/dL
SPECIFIC GRAVITY: <=1.005 (ref 1.003–1.035)
UROBILINOGEN: 0.2 mg/dL (ref 0.2–1.0)

## 2023-02-06 LAB — PT/INR
INR: 1.1 (ref 0.84–1.10)
PROTHROMBIN TIME: 12.9 seconds — ABNORMAL HIGH (ref 9.8–12.7)

## 2023-02-06 LAB — SALICYLATE ACID LEVEL: SALICYLATE LEVEL: 0 mg/dL — ABNORMAL LOW (ref 3–20)

## 2023-02-06 LAB — PTT (PARTIAL THROMBOPLASTIN TIME): APTT: 31.4 seconds (ref 25.0–38.0)

## 2023-02-06 LAB — ACETAMINOPHEN LEVEL: ACETAMINOPHEN LEVEL: 0 ug/mL — ABNORMAL LOW (ref 10.0–30.0)

## 2023-02-06 MED ORDER — DIPHENHYDRAMINE 50 MG/ML INJECTION SOLUTION
50.0000 mg | INTRAMUSCULAR | Status: AC
Start: 2023-02-06 — End: 2023-02-06
  Administered 2023-02-06: 50 mg via INTRAVENOUS

## 2023-02-06 MED ORDER — LORAZEPAM 2 MG/ML INJECTION SYRINGE
INJECTION | INTRAMUSCULAR | Status: AC
Start: 2023-02-06 — End: 2023-02-06
  Filled 2023-02-06: qty 1

## 2023-02-06 MED ORDER — DIPHENHYDRAMINE 50 MG/ML INJECTION SOLUTION
INTRAMUSCULAR | Status: AC
Start: 2023-02-06 — End: 2023-02-06
  Filled 2023-02-06: qty 1

## 2023-02-06 MED ORDER — LORAZEPAM 2 MG/ML INJECTION WRAPPER
1.0000 mg | INTRAMUSCULAR | Status: AC
Start: 2023-02-06 — End: 2023-02-06
  Administered 2023-02-06: 1 mg via INTRAVENOUS

## 2023-02-06 NOTE — ED Nurses Note (Signed)
When discharged the pt was given the phone numbers for the crisis intervention. Instructions to followup at [facility] in 3 days. Ambulatory with his belongings and states he is going to wait in the waiting room for his girlfriend.

## 2023-02-06 NOTE — ED Nurses Note (Signed)
Patient agreeable to treatment.  IV fluids restarted.  Placed on cardiac, BP, 02 monitor.  EKG obtained, urine collected.  Positioned for comfort.  Per poison control patient needs to be monitored for 6 hours. .[facility] at bedside. Suicide precautions remain in place

## 2023-02-06 NOTE — ED Nurses Note (Signed)
Spoke with Jeri Modena at [facility] mobile crisis unit with updated status of pt being discharged and pt denying SI or HI at this time. Verbalized understanding.

## 2023-02-06 NOTE — ED Provider Notes (Addendum)
[facility] emergency department         HISTORY OF PRESENT ILLNESS     Date:  02/06/2023  Patient's Name:  Donald Atkins  Date of Birth:  1985/11/30    PATIENT IS A 37-YEAR-OLD WITH LONGSTANDING HISTORY OF BIPOLAR DISORDER SCHIZOPHRENIA POLYSUBSTANCE ABUSE.  PATIENT APPARENTLY TOOK 17 COGENTIN 1 MG TABS ABOUT HOUR PRIOR TO COMING TO THE EMERGENCY ROOM.  PATIENT PRESENTLY VERY AGITATED AGGRESSIVE IN THE EMERGENCY ROOM PATIENT TOOK THE PILLS TO KILL HIMSELF.  HE DENIES AUDITORY OR VISUAL HALLUCINATIONS PATIENT VERY AGGRESSIVE TOWARDS PHYSICIAN AND NURSING STAFF.  HE INITIALLY REFUSED URINALYSIS BLOOD WORK AND FURTHER WORKUP.  HE DENIES AUDITORY OR VISUAL HALLUCINATION.        Review of Systems     Review of Systems   HENT: Negative.     Cardiovascular: Negative.    Gastrointestinal: Negative.    Psychiatric/Behavioral:  Positive for agitation, behavioral problems and suicidal ideas.    All other systems reviewed and are negative.      Previous History     Past Medical History:  Past Medical History:   Diagnosis Date    Bipolar disorder, unspecified (CMS HCC)     Borderline intellectual functioning     Convulsions (CMS HCC)     Convulsions (CMS HCC)     GAD (generalized anxiety disorder)     GERD (gastroesophageal reflux disease)     Mixed hyperlipidemia     Schizoaffective disorder (CMS HCC)     Schizophrenia (CMS HCC)        Past Surgical History:  History reviewed. No pertinent surgical history.    Social History:  Social History     Tobacco Use    Smoking status: Never     Passive exposure: Never    Smokeless tobacco: Never   Vaping Use    Vaping status: Never Used   Substance Use Topics    Alcohol use: Never    Drug use: Never     Social History     Substance and Sexual Activity   Drug Use Never       Family History:  No family history on file.    Medication History:  Current Outpatient Medications   Medication Sig    benztropine (COGENTIN) 1 mg Oral Tablet Take 2 Tablets  (2 mg total) by mouth Twice daily    divalproex (DEPAKOTE) 500 mg Oral Tablet, Delayed Release (E.C.) Take 1 Tablet (500 mg total) by mouth Twice daily    haloperidoL (HALDOL) 10 mg Oral Tablet Take 1 Tablet (10 mg total) by mouth Twice daily    omeprazole (PRILOSEC) 20 mg Oral Capsule, Delayed Release(E.C.) Take 1 Capsule (20 mg total) by mouth Once a day    QUEtiapine (SEROQUEL) 100 mg Oral Tablet Take 1 Tablet (100 mg total) by mouth Twice daily       Allergies:  Allergies   Allergen Reactions    Latex     Penicillins Anaphylaxis    Latex        Physical Exam     Vitals:    BP 128/81   Pulse 57   Temp 36.8 C (98.2 F)   Resp 18   Ht 1.854 m (6\' 1" )   Wt 63.5 kg (140 lb)   SpO2 100%   BMI 18.47 kg/m           Physical Exam  Vitals and nursing note reviewed.   Constitutional:  General: He is not in acute distress.     Appearance: Normal appearance. He is well-developed and normal weight.   HENT:      Head: Normocephalic and atraumatic.      Right Ear: Tympanic membrane normal.      Left Ear: Tympanic membrane normal.      Mouth/Throat:      Mouth: Mucous membranes are moist.   Eyes:      Conjunctiva/sclera: Conjunctivae normal.      Pupils: Pupils are equal, round, and reactive to light.   Cardiovascular:      Rate and Rhythm: Normal rate and regular rhythm.      Heart sounds: No murmur heard.  Pulmonary:      Effort: Pulmonary effort is normal. No respiratory distress.      Breath sounds: Normal breath sounds.   Abdominal:      General: Bowel sounds are normal.      Palpations: Abdomen is soft.      Tenderness: There is no abdominal tenderness.   Musculoskeletal:         General: No swelling. Normal range of motion.      Cervical back: Normal range of motion and neck supple.   Skin:     General: Skin is warm and dry.      Capillary Refill: Capillary refill takes less than 2 seconds.   Neurological:      General: No focal deficit present.      Mental Status: He is alert and oriented to person, place,  and time.   Psychiatric:         Mood and Affect: Mood normal.         Diagnostic Studies/Treatment     Medications:  Medications Administered in the ED   LR bolus infusion 1,000 mL (0 mL Intravenous Stopped 02/05/23 2324)   LORazepam (ATIVAN) 2 mg/mL injection (1 mg Intravenous Given 02/06/23 0129)   diphenhydrAMINE (BENADRYL) 50 mg/mL injection (50 mg Intravenous Given 02/06/23 0129)       New Prescriptions    No medications on file       Labs:    Results for orders placed or performed during the hospital encounter of 02/05/23 (from the past 12 hour(s))   COVID-19 SCREENING - Asymptomatic   Result Value Ref Range    SARS-CoV-2 Not Detected Not Detected    INFLUENZA VIRUS TYPE A Not Detected Not Detected    INFLUENZA VIRUS TYPE B Not Detected Not Detected    RESPIRATORY SYNCTIAL VIRUS (RSV) Not Detected Not Detected   ACETAMINOPHEN LEVEL   Result Value Ref Range    ACETAMINOPHEN LEVEL 0.0 (L) 10.0 - 30.0 ug/mL   COMPREHENSIVE METABOLIC PANEL, NON-FASTING   Result Value Ref Range    SODIUM 142 136 - 145 mmol/L    POTASSIUM 3.9 3.5 - 5.1 mmol/L    CHLORIDE 106 98 - 107 mmol/L    CO2 TOTAL 27 21 - 32 mmol/L    ANION GAP 9 4 - 13 mmol/L    BUN 7 7 - 18 mg/dL    CREATININE 2.53 6.64 - 1.30 mg/dL    BUN/CREA RATIO 7     ESTIMATED GFR 99 >59 mL/min/1.77m^2    ALBUMIN 4.0 3.4 - 5.0 g/dL    CALCIUM 9.0 8.5 - 40.3 mg/dL    GLUCOSE 474 (H) 74 - 106 mg/dL    ALKALINE PHOSPHATASE 112 46 - 116 U/L    ALT (SGPT) 49 <=78 U/L  AST (SGOT) 22 15 - 37 U/L    BILIRUBIN TOTAL 0.5 0.2 - 1.0 mg/dL    PROTEIN TOTAL 7.5 6.4 - 8.2 g/dL    ALBUMIN/GLOBULIN RATIO 1.1 0.8 - 1.4    OSMOLALITY, CALCULATED 282 270 - 290 mOsm/kg    CALCIUM, CORRECTED 9.0 mg/dL    GLOBULIN 3.5    ETHANOL, SERUM   Result Value Ref Range    ETHANOL <3 <=3 mg/dL   LACTIC ACID LEVEL W/ REFLEX FOR LEVEL >2.0   Result Value Ref Range    LACTIC ACID 1.8 0.4 - 2.0 mmol/L   PT/INR   Result Value Ref Range    PROTHROMBIN TIME 12.9 (H) 9.8 - 12.7 seconds    INR 1.10 0.84 - 1.10    SALICYLATE ACID LEVEL   Result Value Ref Range    SALICYLATE LEVEL 0 (L) 3 - 20 mg/dL   PTT (PARTIAL THROMBOPLASTIN TIME)   Result Value Ref Range    APTT 31.4 25.0 - 38.0 seconds   CBC WITH DIFF   Result Value Ref Range    WBC 7.8 4.0 - 10.5 x10^3/uL    RBC 5.02 4.20 - 6.00 x10^6/uL    HGB 15.0 13.5 - 18.0 g/dL    HCT 16.1 09.6 - 04.5 %    MCV 88.1 78.0 - 99.0 fL    MCH 29.8 27.0 - 32.0 pg    MCHC 33.9 32.0 - 36.0 g/dL    RDW 40.9 (H) 81.1 - 14.8 %    PLATELETS 284 140 - 440 x10^3/uL    MPV 7.8 7.4 - 10.4 fL    NEUTROPHIL % 74 40 - 76 %    LYMPHOCYTE % 20 (L) 25 - 45 %    MONOCYTE % 6 0 - 12 %    EOSINOPHIL % 0 (L) 1 - 8 %    BASOPHIL % 0 0 - 3 %    NEUTROPHIL # 5.79 1.80 - 8.40 x10^3/uL    LYMPHOCYTE # 1.53 1.10 - 5.00 x10^3/uL    MONOCYTE # 0.43 0.00 - 1.30 x10^3/uL    EOSINOPHIL # 0.03 0.00 - 0.80 x10^3/uL    BASOPHIL # 0.03 0.00 - 0.30 x10^3/uL   DRUG SCREEN, NO CONFIRMATION, URINE   Result Value Ref Range    AMPHETAMINES URINE Negative Negative    BARBITURATES URINE Negative Negative    BENZODIAZEPINES URINE Negative Negative    METHADONE URINE Negative Negative    COCAINE METABOLITES URINE Negative Negative    OPIATES URINE Negative Negative    PCP URINE Negative Negative    CANNABINOIDS URINE Negative Negative   URINALYSIS, MACRO/MICRO   Result Value Ref Range    COLOR Light Yellow Light Yellow, Yellow    APPEARANCE Clear Clear    SPECIFIC GRAVITY <=1.005 1.003 - 1.035    PH 6.0 4.6 - 8.0    LEUKOCYTES Negative Negative WBCs/uL    NITRITE Negative Negative    PROTEIN Negative Negative mg/dL    GLUCOSE Negative Negative mg/dL    KETONES Negative Negative mg/dL    BILIRUBIN Negative Negative mg/dL    BLOOD Negative Negative mg/dL    UROBILINOGEN 0.2 0.2 - 1.0 mg/dL        Radiology:  XR CHEST AP    XR CHEST AP   Final Result   NO ACUTE FINDINGS.            Radiologist location ID: [id number]  ECG:  NONE            Differential diagnosis      Course/Disposition/Plan     Course:     ED Course as  of 02/06/23 0726   Mon Feb 06, 2023   0110 PATIENT CARE WAS DISCUSSED WITH POISON CONTROL.  PATIENT NEEDS TO BE MONITORED FOR 6-8 HOURS.  ONCE MEDICALLY CLEARED PATIENT WILL BE EVALUATED FOR INPATIENT CARE   [phone] PATIENT MEDICALLY CLEARED FOR INPATIENT PSYCHIATRY EVALUATION   0722 PATIENT REFUSED TO BE ADMITTED TO INPATIENT.  PATIENT IS NO LONGER SUICIDAL OR HOMICIDAL.  HE DOES NOT WISH TO REMAIN IN THE EMERGENCY ROOM HE WILL BE DISCHARGED HOME   [phone] PATIENT BE DISCHARGED FROM THE EMERGENCY ROOM FOLLOW-UP OUTPATIENT [facility]       Disposition:    Discharged    Condition at Disposition:       Follow up:   Crisoforo Oxford, MD  [address]  [place] [address]  [phone]    Schedule an appointment as soon as possible for a visit in 3 days  If symptoms worsen      Clinical Impression:     Clinical Impression   Bipolar disorder with depression (CMS HCC) (Primary)         Tivis Ringer, MD

## 2023-02-06 NOTE — ED Nurses Note (Addendum)
Pt provided with meal tray and po fluids. Pt ambulated to restroom and back to room with steady gait. Denies any c/o pain. Denies any additional wants at this time.

## 2023-02-06 NOTE — ED Nurses Note (Signed)
Pt denies SI or HI at this time. Pt refuses to go to [place]. Pt states unable to pay for transport to another facility. Pt requesting to be discharged. Dr notified.

## 2023-02-06 NOTE — ED Nurses Note (Signed)
Patient ambulated to restroom w/o difficulty.  Back into room and into bed.  Denies complaints.

## 2023-02-06 NOTE — ED Attending Note (Signed)
Risk Factors:  Major depression  Protective Factors:  impulsivity  Suicide Inquiry:    Thoughts:  PATIENT WITH MULTIPLE EPISODES IN THE PAST  Plans/Behaviors/Past attempts: has means to carry out plan  Intent: Patient expressed intent to harm self with definite plan.    Risk Level:   HIGH RISK = This is because patient has psychiatric diagnoses with severe symptoms, acute precipitating event, and/or potentially lethal suicide attempt or persistent ideation with strong intent or suicide rehearsal.    MODERATE RISK = This is because patient has risk factors that out weigh the protective factors OR Suicidal Ideation with plan but without intent or behavior.    LOW RISK = This is because protective factors that outweigh risk factor OR thoughts of death without plan, intent, or behaviors.    Based on assessment, the patient is a high risk of suicide.    Intervention:     HIGH RISK: Patient has been found to be HIGH RISK for suicide.  Admission generally indicated unless a significant change reduces risk. Suicide precautions initiated including 1:1 observation until transferred to psychiatric unit/facility or risk is downgraded, removal of potential lethal objects and personal belongings from the room until transfer, and other measures advised by psych.    Documentation  Given the above risk level the following plan of care was developed:    Admission is required due to  SUICIDE ATTEMPT BY PILL OVERDOSE  1:1 observation was required due to SUICIDE ATTEMPT BY PILL OVERDOSE  Medication management - unchanged.   Behavioral health referral was offered and was accepted by the patient.   Contact with supportive persons in their life was encouraged. Patient identifies  NO ONE as a person of trust and support.  Patient counseled by provider on Suicide prevention and provided with written information, crisis phone numbers, and resources.  Patient follow up is  PATIENT IN NEED OF INPATIENT MANAGEMENT

## 2023-02-06 NOTE — ED Nurses Note (Signed)
Per poison control, patient can be disposition at this time.

## 2023-02-06 NOTE — ED Nurses Note (Signed)
Called [facility] @0548  they no beds.  [facility] @0549  no beds.  [facility] @0550  they did not answer, but did leave a voicemail to call back.

## 2023-02-06 NOTE — ED Nurses Note (Signed)
Pt denies any suicidal or homicidal ideations, states he just wants to go home to his girlfriend.

## 2023-02-06 NOTE — ED Nurses Note (Signed)
[facility] called said they spoke with their supervisor and they can not take a patient that has overdosed until it has been 24 hours, even though poison control said they were good to go in 6 hours.

## 2023-03-08 ENCOUNTER — Encounter (HOSPITAL_BASED_OUTPATIENT_CLINIC_OR_DEPARTMENT_OTHER): Payer: Self-pay

## 2023-03-08 ENCOUNTER — Other Ambulatory Visit: Payer: Self-pay

## 2023-03-08 ENCOUNTER — Emergency Department
Admission: EM | Admit: 2023-03-08 | Discharge: 2023-03-09 | Disposition: A | Discharge status: Other Acute Inpatient Hospital | Payer: MEDICAID | Attending: Emergency Medicine | Admitting: Emergency Medicine

## 2023-03-08 DIAGNOSIS — F911 Conduct disorder, childhood-onset type: Secondary | ICD-10-CM

## 2023-03-08 DIAGNOSIS — G4700 Insomnia, unspecified: Secondary | ICD-10-CM

## 2023-03-08 DIAGNOSIS — R45851 Suicidal ideations: Secondary | ICD-10-CM

## 2023-03-08 DIAGNOSIS — Z1152 Encounter for screening for COVID-19: Secondary | ICD-10-CM | POA: Insufficient documentation

## 2023-03-08 LAB — COMPREHENSIVE METABOLIC PANEL, NON-FASTING
ALBUMIN/GLOBULIN RATIO: 1.1 (ref 0.8–1.4)
ALBUMIN: 3.7 g/dL (ref 3.4–5.0)
ALKALINE PHOSPHATASE: 99 U/L (ref 46–116)
ALT (SGPT): 47 U/L (ref <=78)
ANION GAP: 10 mmol/L (ref 4–13)
AST (SGOT): 22 U/L (ref 15–37)
BILIRUBIN TOTAL: 0.5 mg/dL (ref 0.2–1.0)
BUN/CREA RATIO: 11
BUN: 10 mg/dL (ref 7–18)
CALCIUM, CORRECTED: 9.5 mg/dL
CALCIUM: 9.3 mg/dL (ref 8.5–10.1)
CHLORIDE: 106 mmol/L (ref 98–107)
CO2 TOTAL: 25 mmol/L (ref 21–32)
CREATININE: 0.93 mg/dL (ref 0.70–1.30)
ESTIMATED GFR: 109 mL/min/{1.73_m2} (ref >59)
GLOBULIN: 3.5
GLUCOSE: 121 mg/dL — ABNORMAL HIGH (ref 74–106)
OSMOLALITY, CALCULATED: 282 mOsm/kg (ref 270–290)
POTASSIUM: 3.4 mmol/L — ABNORMAL LOW (ref 3.5–5.1)
PROTEIN TOTAL: 7.2 g/dL (ref 6.4–8.2)
SODIUM: 141 mmol/L (ref 136–145)

## 2023-03-08 LAB — URINALYSIS, MACRO/MICRO
BILIRUBIN: NEGATIVE mg/dL
BLOOD: NEGATIVE mg/dL
GLUCOSE: NEGATIVE mg/dL
KETONES: NEGATIVE mg/dL
LEUKOCYTES: NEGATIVE WBCs/uL
NITRITE: NEGATIVE
PH: 6 (ref 4.6–8.0)
PROTEIN: NEGATIVE mg/dL
SPECIFIC GRAVITY: 1.025 (ref 1.003–1.035)
UROBILINOGEN: 0.2 mg/dL (ref 0.2–1.0)

## 2023-03-08 LAB — DRUG SCREEN, NO CONFIRMATION, URINE
AMPHETAMINES URINE: NEGATIVE
BARBITURATES URINE: NEGATIVE
BENZODIAZEPINES URINE: NEGATIVE
CANNABINOIDS URINE: NEGATIVE
COCAINE METABOLITES URINE: NEGATIVE
METHADONE URINE: NEGATIVE
OPIATES URINE: NEGATIVE
PCP URINE: NEGATIVE

## 2023-03-08 LAB — CBC WITH DIFF
BASOPHIL #: 0.01 10*3/uL (ref 0.00–0.30)
BASOPHIL %: 0 % (ref 0–3)
EOSINOPHIL #: 0.05 10*3/uL (ref 0.00–0.80)
EOSINOPHIL %: 1 % (ref 1–8)
HCT: 43.9 % (ref 42.0–51.0)
HGB: 14.5 g/dL (ref 13.5–18.0)
LYMPHOCYTE #: 1.83 10*3/uL (ref 1.10–5.00)
LYMPHOCYTE %: 27 % (ref 25–45)
MCH: 29.2 pg (ref 27.0–32.0)
MCHC: 33.1 g/dL (ref 32.0–36.0)
MCV: 88.3 fL (ref 78.0–99.0)
MONOCYTE #: 0.36 10*3/uL (ref 0.00–1.30)
MONOCYTE %: 5 % (ref 0–12)
MPV: 7.7 fL (ref 7.4–10.4)
NEUTROPHIL #: 4.47 10*3/uL (ref 1.80–8.40)
NEUTROPHIL %: 67 % (ref 40–76)
PLATELETS: 256 10*3/uL (ref 140–440)
RBC: 4.97 10*6/uL (ref 4.20–6.00)
RDW: 16.8 % — ABNORMAL HIGH (ref 11.6–14.8)
WBC: 6.7 10*3/uL (ref 4.0–10.5)

## 2023-03-08 LAB — SALICYLATE ACID LEVEL: SALICYLATE LEVEL: 0 mg/dL — ABNORMAL LOW (ref 3–20)

## 2023-03-08 LAB — COVID-19 ~~LOC~~ MOLECULAR LAB TESTING
INFLUENZA VIRUS TYPE A: NOT DETECTED
INFLUENZA VIRUS TYPE B: NOT DETECTED
RESPIRATORY SYNCTIAL VIRUS (RSV): NOT DETECTED
SARS-CoV-2: NOT DETECTED

## 2023-03-08 LAB — ACETAMINOPHEN LEVEL: ACETAMINOPHEN LEVEL: 0 ug/mL — ABNORMAL LOW (ref 10.0–30.0)

## 2023-03-08 LAB — ETHANOL, SERUM/PLASMA: ETHANOL: <3 mg/dL (ref <=3)

## 2023-03-08 NOTE — ED Provider Notes (Signed)
Emergency Medicine      Name: Donald Atkins  Age and Gender: 37 y.o. male  Date of Birth: 1985/07/19  MRN: [id number]  PCP: No Pcp    CC:  Chief Complaint   Patient presents with    Insomnia       HPI:  Donald Atkins is a 37 y.o. White male who presents to the ER with insomnia x5 days, SI/HI/AVH/paranoia.  Patient reports hearing voices and seeing things instructed him to do harm to himself and others.  Patient also reports feeling as though people are trying to do him harm.  Patient does have a plan to commit mass homicide ending in suicide.  Patient fidgety, guarding, with poor eye contact and rambling speech.    Below pertinent information reviewed with patient:  Past Medical History:   Diagnosis Date    Bipolar disorder, unspecified (CMS HCC)     Borderline intellectual functioning     Convulsions (CMS HCC)     Convulsions (CMS HCC)     GAD (generalized anxiety disorder)     GERD (gastroesophageal reflux disease)     Mixed hyperlipidemia     Schizoaffective disorder (CMS HCC)     Schizophrenia (CMS HCC)            Allergies   Allergen Reactions    Latex     Penicillins Anaphylaxis    Latex        No past surgical history on file.     Social History     Socioeconomic History    Marital status: Unknown   Tobacco Use    Smoking status: Never     Passive exposure: Never    Smokeless tobacco: Never   Vaping Use    Vaping status: Never Used   Substance and Sexual Activity    Alcohol use: Never    Drug use: Never    Sexual activity: Not Currently     Social Determinants of Health     Financial Resource Strain: Low Risk  (05/16/2022)    Financial Resource Strain     SDOH Financial: No   Transportation Needs: Low Risk  (05/16/2022)    Transportation Needs     SDOH Transportation: No   Social Connections: Medium Risk (05/16/2022)    Social Connections     SDOH Social Isolation: 3 to 5 times a week   Housing Stability: Low Risk  (05/16/2022)    Housing Stability     SDOH Housing Situation: I have housing.     SDOH Housing  Worry: No       ROS:  No other overt positive review of systems are noted other than stated in the HPI.      Objective:    ED Triage Vitals [03/08/23 2209]   BP (Non-Invasive) 134/84   Heart Rate (!) 101   Respiratory Rate 20   Temperature 36.6 C (97.8 F)   SpO2 98 %   Weight 85.7 kg (189 lb)   Height 1.854 m (6\' 1" )     Filed Vitals:    03/08/23 2209   BP: 134/84   Pulse: (!) 101   Resp: 20   Temp: 36.6 C (97.8 F)   SpO2: 98%       Nursing notes and vital signs reviewed.    Constitutional - No acute distress.  Alert and Active.  HEENT - Normocephalic. Atraumatic. PERRL. EOMI. Conjunctiva clear. TM's pearly grey, translucent, without bulging or retraction. Oropharynx with no erythema, lesions, or exudates.  Moist mucous membranes.   Neck - Trachea midline. No stridor. No hoarseness.  Cardiac - Regular rate and rhythm. No murmurs, rubs, or gallops. Intact distal pulses.  Respiratory/Chest - Normal respiratory effort. Clear to auscultation bilaterally. No rales, wheezes or rhonchi. No chest tenderness.  Abdomen - Normal bowel sounds. Non-tender, soft, non-distended. No rebound or guarding.   Musculoskeletal - Good AROM. No muscle or joint tenderness appreciated. No clubbing, cyanosis or edema.  Skin - Warm and dry, without any rashes or other lesions.  Neuro - Alert and oriented x 3. Cranial nerves II-XII are grossly intact.  Moving all extremities symmetrically. Normal gait.  Psych - Normal mood and affect. Behavior is normal            Risk Factors:    Access to firearms outside of the hospital  Protective Factors:      Suicide Inquiry:    Thoughts:   ideation with a plan--mass homicide w/ suicide by cop    Plans/Behaviors/Past attempts:   Suicide by cop  Intent:   Patient expressed intent to harm self with definite plan.    Risk Level:     Based on my assessment, the patient is a high risk of suicide     Intervention:     HIGH RISK: Patient has been found to be HIGH RISK for suicide.  Admission generally  indicated unless a significant change reduces risk. Suicide precautions initiated including 1:1 observation until transferred to psychiatric unit/facility or risk is downgraded, removal of potential lethal objects and personal belongings from the room until transfer, and other measures advised by psych.    Documentation  Given the above risk level the following plan of care was developed:    Admission is required due to SI/HI/AVH/Paranoia.   1:1 observation was required due to SI/HI with a plan.  Medication management -   Behavioral health referral was offered and was accepted by the patient.  Contact with supportive persons in their life was encouraged. Patient identifies 0 as a person of trust and support.  Patient counseled by provider on Suicide prevention and provided with written information, crisis phone numbers, and resources.        Any pertinent labs and imaging obtained during this encounter reviewed below in MDM.    MDM/ED Course:        Medical Decision Making  Donald Atkins is a 37 y.o. White male who presents to the ER with insomnia x5 days, SI/HI/AVH/paranoia.  Patient reports hearing voices and seeing things instructed him to do harm to himself and others.  Patient also reports feeling as though people are trying to do him harm.  Patient does have a plan to commit mass homicide ending in suicide.  Patient fidgety, guarding, with poor eye contact and rambling speech.    Differential diagnosis  1. Psychosis  2. Substance use disorder    Diagnostics will include labs    Suicide precautions will be ordered              Critical care:   Critical Care Attestation:  As the ED nurse practitioner, I have provided critical care for this patient.  This patient has high probability of imminent life or limb threatening deterioration due to suicidal ideation with plan and recent attempt.  I provided direct patient care, documentation of findings, review of medical records, and coordination of multidisciplinary  care and frequent reassessment.  Time spent providing critical care (exclusive of any billed procedures and/or teaching): 45 minutes.  Orders Placed This Encounter    ACETAMINOPHEN LEVEL    COMPREHENSIVE METABOLIC PANEL, NON-FASTING    CBC/DIFF    ETHANOL, SERUM    URINALYSIS WITH REFLEX MICROSCOPIC AND CULTURE IF POSITIVE    DRUG SCREEN, NO CONFIRMATION, URINE    CBC WITH DIFF    URINALYSIS, MACRO/MICRO    COVID-19 SCREENING - Asymptomatic    SALICYLATE ACID LEVEL         Any procedures:      Impression:   Clinical Impression   None       Disposition: Data Unavailable    / Corinna Lines, FNP-BC  03/08/2023, 22:15  [facility]  Department of Emergency Medicine  [facility]    Portions of this note may have been dictated using voice recognition software.     -----------------------  No results found for this or any previous visit (from the past 12 hour(s)).  No orders to display

## 2023-03-08 NOTE — ED Nurses Note (Addendum)
Patient states he has not slept in 5 days, he states he plans to kill his entire family with his mothers .22 riffle before heading to [place] to kill the judge who lets pedophiles off on their crimes before he kills himself either by police suicide or pulling the trigger himself. He states he hears voices tell him to kill people and he can't help it that he must do it. Patient states he does not want to follow the orders and requests to be sent to [facility] for [facility] therapy. Patient searches and places in SI safe clothing. Patient states he has previously attempted suicide by medication overdose in the past.

## 2023-03-08 NOTE — ED Attending Note (Addendum)
[facility] Emergency Department  Emergency Department  Course Note    Care/report received from MID-LEVEL at  22:59.  Per report:  Donald Atkins is a 37 y.o. male who had concerns including Insomnia.  PATIENT ALSO WITH SUICIDAL IDEATION HOMICIDAL THOUGHTS    Pending labs/imaging/consults:  MEDICAL CLEARANCE LABS HAVE BEEN ORDERED  Plan:  PATIENT TO BE MEDICALLY CLEARED FOR INPATIENT PSYCHIATRIC EVALUATION    Course:   ED Course as of 03/09/23 2211   Wed Mar 08, 2023   2341 PATIENT MEDICALLY CLEARED FOR INPATIENT PSYCHIATRY EVALUATION   Thu Mar 09, 2023   2211 PATIENT HAS BEEN COMMITTED INVOLUNTARILY TO INPATIENT AT [facility]     Based on the history, physical exam, and tests obtained as indicated, the Emergency Department evaluation has identified no acute or active medical issues that warrant further emergency department intervention or precludes discharge of the patient in the company of law enforcement or transfer to a psychiatric facility.    Disposition: Psych Facility          Clinical Impression   Insomnia, unspecified type (Primary)   Suicide ideation         Tivis Ringer, MD
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
## 2023-03-08 NOTE — ED Triage Notes (Signed)
EMS reports pt called them complaining of insomnia X 4-5 days. Intermittent thoughts of SI en route per EMS. VSS en route

## 2023-03-08 NOTE — ED Nurses Note (Signed)
[place] sher[employer] previously notified by EMS. They have arrived in ED to speak with pt

## 2023-03-09 MED ORDER — ZIPRASIDONE 20 MG/ML (FINAL CONCENTRATION) INTRAMUSCULAR SOLUTION
INTRAMUSCULAR | Status: AC
Start: 2023-03-09 — End: 2023-03-09
  Filled 2023-03-09: qty 1

## 2023-03-09 MED ORDER — LORAZEPAM 1 MG TABLET
2.0000 mg | ORAL_TABLET | ORAL | Status: AC
Start: 2023-03-09 — End: 2023-03-09
  Administered 2023-03-09: 2 mg via ORAL

## 2023-03-09 MED ORDER — ZIPRASIDONE 20 MG CAPSULE
40.0000 mg | ORAL_CAPSULE | ORAL | Status: AC
Start: 2023-03-09 — End: 2023-03-09
  Administered 2023-03-09: 40 mg via ORAL

## 2023-03-09 MED ORDER — ZIPRASIDONE 20 MG/ML (FINAL CONCENTRATION) INTRAMUSCULAR SOLUTION
20.0000 mg | Freq: Once | INTRAMUSCULAR | Status: AC
Start: 2023-03-09 — End: 2023-03-09
  Administered 2023-03-09: 20 mg via INTRAMUSCULAR

## 2023-03-09 MED ORDER — ZIPRASIDONE 20 MG CAPSULE
ORAL_CAPSULE | ORAL | Status: AC
Start: 2023-03-09 — End: 2023-03-09
  Filled 2023-03-09: qty 1

## 2023-03-09 MED ORDER — LORAZEPAM 1 MG TABLET
ORAL_TABLET | ORAL | Status: AC
Start: 2023-03-09 — End: 2023-03-09
  Filled 2023-03-09: qty 2

## 2023-03-09 MED ORDER — ZIPRASIDONE 20 MG CAPSULE
ORAL_CAPSULE | ORAL | Status: AC
Start: 2023-03-09 — End: 2023-03-09
  Filled 2023-03-09: qty 2

## 2023-03-09 NOTE — ED Nurses Note (Signed)
Call received from Pervis Hocking, Evaluator , stating patient waved his right to a hearing and she would recommend he be committed.

## 2023-03-09 NOTE — ED Nurses Note (Addendum)
Pt continues to pace in room. Dr. Kinnie Scales in. New order noted.

## 2023-03-09 NOTE — ED Nurses Note (Signed)
Patient resting quietly with eyes closed. Respiration even and non labored. No problems noted at this time.

## 2023-03-09 NOTE — ED Notes (Signed)
[facility] crisis worker in to speak with patient at this time.

## 2023-03-09 NOTE — ED Nurses Note (Signed)
Patient leaving with [facility] Department at this time.

## 2023-03-09 NOTE — ED Nurses Note (Signed)
Pt no longer pacing. States, "That medication helped me. I don't feel as anxious. I would like to go ta long term phychiatric facility. When I get out. I want to go to group therapy."

## 2023-03-09 NOTE — ED Nurses Note (Signed)
Pt taking with family. Using phone at the desk.

## 2023-03-09 NOTE — ED Attending Handoff Note (Signed)
Care with the patient at 7:30 a.m.Marland Kitchen  Patient with history of bipolar, schizophrenia.  He overdose prior to come to the ED.  He actually threatened overdose here in the ED stating that he went to the [facility] he wpud overdose..  In addition when he 1st arrived here he was aggressive was threatening with staff.  Initially he was voluntary but he was refusing admission even though he was suicidal high risk.  So I have placed a commitment on him.      He was medically clear for psychiatric evaluation and admission

## 2023-03-09 NOTE — ED Nurses Note (Addendum)
Patient pacing in his room looking at the walls. Patient motioned for me to come to him. When I approached pt, he states, "The voices are telling me to kill myself right now." I sat with patient until he felt safe and calm.

## 2023-03-09 NOTE — ED Notes (Signed)
Hold order received at this time.

## 2023-03-09 NOTE — ED Nurses Note (Addendum)
Pt awake pacing in room. Asking to to speak with Dr.  Dr Kinnie Scales in. Pt given breakfast.

## 2023-03-09 NOTE — ED Nurses Note (Signed)
Breakfast of oatmeal and milk given to patient. When ask how he was doing this morning states "the voices" Asked if he was still hearing voices and patient states "yes". Pacing in room. Sitter remains at bedside.

## 2023-03-09 NOTE — ED Nurses Note (Signed)
Pt resting w/ eyes closed at this time. Equal chest rise and fall noted, no s/s of acute distress noted at this time.

## 2023-03-11 DIAGNOSIS — Z136 Encounter for screening for cardiovascular disorders: Secondary | ICD-10-CM

## 2023-03-11 LAB — ECG 12 LEAD
Atrial Rate: 78 {beats}/min
Calculated P Axis: 30 degrees
Calculated R Axis: 45 degrees
Calculated T Axis: 28 degrees
PR Interval: 158 ms
QRS Duration: 92 ms
QT Interval: 358 ms
QTC Calculation: 408 ms
Ventricular rate: 78 {beats}/min

## 2023-05-25 ENCOUNTER — Emergency Department
Admission: EM | Admit: 2023-05-25 | Discharge: 2023-05-25 | Discharge status: Against Medical Advice | Payer: MEDICAID | Attending: NURSE PRACTITIONER | Admitting: NURSE PRACTITIONER

## 2023-05-25 ENCOUNTER — Other Ambulatory Visit: Payer: Self-pay

## 2023-05-25 DIAGNOSIS — F419 Anxiety disorder, unspecified: Secondary | ICD-10-CM

## 2023-05-25 DIAGNOSIS — Z5329 Procedure and treatment not carried out because of patient's decision for other reasons: Secondary | ICD-10-CM | POA: Insufficient documentation

## 2023-05-25 DIAGNOSIS — Z1152 Encounter for screening for COVID-19: Secondary | ICD-10-CM | POA: Insufficient documentation

## 2023-05-25 LAB — CBC WITH DIFF
BASOPHIL #: 0.02 10*3/uL (ref 0.00–0.30)
BASOPHIL %: 0 % (ref 0–3)
EOSINOPHIL #: 0.06 10*3/uL (ref 0.00–0.80)
EOSINOPHIL %: 1 % (ref 1–8)
HCT: 44.5 % (ref 42.0–51.0)
HGB: 14.4 g/dL (ref 13.5–18.0)
LYMPHOCYTE #: 1.98 10*3/uL (ref 1.10–5.00)
LYMPHOCYTE %: 28 % (ref 25–45)
MCH: 28.6 pg (ref 27.0–32.0)
MCHC: 32.4 g/dL (ref 32.0–36.0)
MCV: 88.3 fL (ref 78.0–99.0)
MONOCYTE #: 0.36 10*3/uL (ref 0.00–1.30)
MONOCYTE %: 5 % (ref 0–12)
MPV: 7.6 fL (ref 7.4–10.4)
NEUTROPHIL #: 4.66 10*3/uL (ref 1.80–8.40)
NEUTROPHIL %: 66 % (ref 40–76)
PLATELETS: 241 10*3/uL (ref 140–440)
RBC: 5.04 10*6/uL (ref 4.20–6.00)
RDW: 15.4 % — ABNORMAL HIGH (ref 11.6–14.8)
WBC: 7.1 10*3/uL (ref 4.0–10.5)

## 2023-05-25 LAB — COVID-19, FLU A/B, RSV RAPID BY PCR
INFLUENZA VIRUS TYPE A: NOT DETECTED
INFLUENZA VIRUS TYPE B: NOT DETECTED
RESPIRATORY SYNCTIAL VIRUS (RSV): NOT DETECTED
SARS-CoV-2: NOT DETECTED

## 2023-05-25 LAB — URINALYSIS, MACRO/MICRO
BILIRUBIN: NEGATIVE mg/dL
BLOOD: NEGATIVE mg/dL
GLUCOSE: NEGATIVE mg/dL
KETONES: NEGATIVE mg/dL
NITRITE: NEGATIVE
PH: 6.5 (ref 4.6–8.0)
PROTEIN: NEGATIVE mg/dL
SPECIFIC GRAVITY: 1.01 (ref 1.003–1.035)
UROBILINOGEN: 0.2 mg/dL (ref 0.2–1.0)

## 2023-05-25 LAB — DRUG SCREEN, NO CONFIRMATION, URINE
AMPHETAMINES URINE: NEGATIVE
BARBITURATES URINE: NEGATIVE
BENZODIAZEPINES URINE: NEGATIVE
CANNABINOIDS URINE: NEGATIVE
COCAINE METABOLITES URINE: NEGATIVE
METHADONE URINE: NEGATIVE
OPIATES URINE: NEGATIVE
PCP URINE: NEGATIVE

## 2023-05-25 LAB — COMPREHENSIVE METABOLIC PANEL, NON-FASTING
ALBUMIN/GLOBULIN RATIO: 1.1 (ref 0.8–1.4)
ALBUMIN: 3.7 g/dL (ref 3.4–5.0)
ALKALINE PHOSPHATASE: 101 U/L (ref 46–116)
ALT (SGPT): 48 U/L (ref <=78)
ANION GAP: 7 mmol/L (ref 4–13)
AST (SGOT): 26 U/L (ref 15–37)
BILIRUBIN TOTAL: 0.3 mg/dL (ref 0.2–1.0)
BUN/CREA RATIO: 7
BUN: 6 mg/dL — ABNORMAL LOW (ref 7–18)
CALCIUM, CORRECTED: 8.8 mg/dL
CALCIUM: 8.6 mg/dL (ref 8.5–10.1)
CHLORIDE: 105 mmol/L (ref 98–107)
CO2 TOTAL: 30 mmol/L (ref 21–32)
CREATININE: 0.86 mg/dL (ref 0.70–1.30)
ESTIMATED GFR: 115 mL/min/{1.73_m2} (ref >59)
GLOBULIN: 3.3
GLUCOSE: 95 mg/dL (ref 74–106)
OSMOLALITY, CALCULATED: 281 mosm/kg (ref 270–290)
POTASSIUM: 3.6 mmol/L (ref 3.5–5.1)
PROTEIN TOTAL: 7 g/dL (ref 6.4–8.2)
SODIUM: 142 mmol/L (ref 136–145)

## 2023-05-25 LAB — URINALYSIS, MICROSCOPIC

## 2023-05-25 LAB — THYROID STIMULATING HORMONE (SENSITIVE TSH): TSH: 1.136 u[IU]/mL (ref 0.358–3.740)

## 2023-05-25 LAB — ACETAMINOPHEN LEVEL: ACETAMINOPHEN LEVEL: 0 ug/mL — ABNORMAL LOW (ref 10.0–30.0)

## 2023-05-25 LAB — ETHANOL, SERUM/PLASMA: ETHANOL: <3 mg/dL (ref <=3)

## 2023-05-25 NOTE — ED Nurses Note (Signed)
Patient brought in by EMS with reports of suicidal ideation. Patient states he has been having active thoughts of suicide, with a plan to swallow razor blades. Patient reports a hx of suicidal thoughts and attempts. Patient changed into paper scrubs. All items removed from room and placed in locker.

## 2023-05-25 NOTE — ED Nurses Note (Signed)
Patient was at [facility] for swallowing a razor blade.  While at the facility he was sent to the ER for swallowing a spork. Patient takes cogentin shot once a month but is a week past due.

## 2023-05-25 NOTE — ED Triage Notes (Signed)
Patient states he has been hearing voices for two days.  These voices are telling him to hurt himself by swallowing things.  Patient states he has an issue with swallowing things and that he will swallow razor blades.

## 2023-05-25 NOTE — ED Nurses Note (Signed)
Patient states he wants to leave as he does not want to go to the [place]. Patient informed we are still doing his workup, and have not contacted any psych facilities yet. Patient states he still wants to leave. Provider in to see patient. Provider asked patient all SI and HI questions. Patient currently denies all SI and HI. Patient informed he would have to sign out AMA if he wants to leave. Patient agreeable. AMA form signed. Patient given all belongings and ambulatory out of ED.

## 2023-05-27 LAB — URINE CULTURE,ROUTINE: URINE CULTURE: NO GROWTH

## 2023-05-30 NOTE — ED Provider Notes (Signed)
Emergency Department  [facility]   05/25/2023     Donald Atkins  02-03-1986  37 y.o.  male  [place] [address]   [phone] (home)  PCP: No Pcp   [id number]    Date of service:05/30/2023 22:44    Chief Complaint:   Chief Complaint   Patient presents with    Suicidal Thoughts        Arrival: The patient arrived by Ambulance and is alone    HPI:  This is a 37 year old male patient who comes into the emergency room complaining of hearing voices, he states that the voices are telling him to hurt himself.  Patient states he has an issue with swallowing things and that he will swallowed razor blades.  At this time patient states that he does not have a plan to hurt himself but he would like to go back into treatment, he does have a history of bipolar disorder, borderline personality disorder, concussions, acid reflux, schizophrenia disorder.  He feels like he needs inpatient treatment.  He has no medical complaints at this time, no respiratory or cardiovascular distress noted at the bedside.  Nontoxic appearing,      Past Medical History:   Past Medical History:   Diagnosis Date    Bipolar disorder, unspecified (CMS HCC)     Borderline intellectual functioning     Convulsions (CMS HCC)     Convulsions (CMS HCC)     GAD (generalized anxiety disorder)     GERD (gastroesophageal reflux disease)     Mixed hyperlipidemia     Schizoaffective disorder (CMS HCC)     Schizophrenia (CMS HCC)        Past Surgical History: No past surgical history on file.    Social History:   Social History     Tobacco Use    Smoking status: Never     Passive exposure: Never    Smokeless tobacco: Never   Vaping Use    Vaping status: Never Used   Substance Use Topics    Alcohol use: Never    Drug use: Never        Family History:  No family history on file.        Medications Prior to Admission       Prescriptions    benztropine (COGENTIN) 1 mg Oral Tablet    Take 2 Tablets (2 mg total) by mouth Twice daily    haloperidoL  (HALDOL) 10 mg Oral Tablet    Take 1 Tablet (10 mg total) by mouth Twice daily    omeprazole (PRILOSEC) 20 mg Oral Capsule, Delayed Release(E.C.)    Take 1 Capsule (20 mg total) by mouth Once a day    QUEtiapine (SEROQUEL) 100 mg Oral Tablet    Take 1 Tablet (100 mg total) by mouth Twice daily            Allergies:   Allergies   Allergen Reactions    Latex     Penicillins Anaphylaxis    Latex        Above history reviewed with patient.  Allergies, medication list, reviewed.        Physical exam:  Body mass index is 23.11 kg/m.  ED Triage Vitals [05/25/23 2008]   BP (Non-Invasive) (!) 181/113   Heart Rate 66   Respiratory Rate 18   Temperature (!) 35.2 C (95.4 F)   SpO2 99 %   Weight 81.6 kg (180 lb)   Height 1.88 m (6\' 2" )  Constitutional: patient is oriented to person, place, and time and well-developed, well-nourished, and in no distress.   HENT:   Head: Normocephalic and atraumatic.   Right Ear: External ear normal.   Left Ear: External ear normal.   Nose: Nose normal.   Mouth/Throat: Oropharynx is clear and moist.   Eyes: Pupils are equal, round, and reactive to light. Conjunctivae and EOM are normal.   Neck: Normal range of motion. Neck supple.   Cardiovascular: Normal rate, regular rhythm, normal heart sounds and intact distal pulses.   Pulmonary/Chest: Effort normal and breath sounds normal.   Abdominal: Soft. Bowel sounds are normal.   Musculoskeletal: Normal range of motion.   Neurological:Patient is alert and oriented to person, place, and time.  Gait normal. GCS score is 15.   Skin: Skin is warm and dry.     The following orders were placed after examining the patient :  Orders Placed This Encounter    URINE CULTURE,ROUTINE    CBC/DIFF    COMPREHENSIVE METABOLIC PANEL, NON-FASTING    ETHANOL, SERUM    CANCELED: THYROID STIMULATING HORMONE WITH FREE T4 REFLEX    URINALYSIS WITH REFLEX MICROSCOPIC AND CULTURE IF POSITIVE    DRUG SCREEN, NO CONFIRMATION, URINE    ACETAMINOPHEN LEVEL    COVID-19, FLU  A/B, RSV RAPID BY PCR    CBC WITH DIFF    URINALYSIS, MACRO/MICRO    URINALYSIS, MICROSCOPIC    THYROID STIMULATING HORMONE (SENSITIVE TSH)    CANCELED: SEARCH AND HOLD PATIENT      No orders to display      No results found for this or any previous visit (from the past 12 hour(s)).      ED Course:                         Medical Decision Making  Nursing notes reviewed.    Medical chart and diagnostic results reviewed.    Attending available for questioning consult.    Emergency department course:     This is a 37 year old male who came into the emergency room complaining of suicide and homicidal ideas.  While in the emergency room he was walking around the nurse's station when 1 of the nurses asked him politely to return to his room.  Patient became irate and disgruntled.  I was called to the bedside.  Patient states that he no longer has suicidal or homicidal ideas.  He would like to leave and go to another hospital.  He states that he does not like nursing staff here and wants to leave.  At this time he was asked again if he had ideas of hurting himself he stated no.  He asked if he had any ideas of hurting other people he said no.  He is alert and oriented x is a 4.  He understands the risks and benefits.  He will be signing out against medical advice.  It is my medical opinion patient should stay in the emergency room now to receive treatment.  He verbalizes understanding to this.  He would still like to sign out AMA.  At this time patient is stable to leave the hospital.    I have discussed and counseled the patient and available family on the patient's condition, test results, treatment, and likely expected clinical course.  Patient was counseled on supportive care at home.  All questions and concerns were addressed at the bedside.  Patient verbalized understanding and is agreeable  with the plan.  Patient is always welcome to return to the ER should new or worsening symptoms occur.  At this time patient is  safe and stable for discharge home with strict return instructions.    Amount and/or Complexity of Data Reviewed  Labs: ordered.         Findings and diagnosis discussed with patient.  Clinical Impression:   Clinical Impression   Anxiety (Primary)       Disposition: AMA      No follow-ups on file.   Discharge Medication List as of 05/25/2023  9:42 PM         Crisoforo Oxford, MD  [address]  [place] [address]  [phone]          [facility]  [address]  [place] [address]  Schedule an appointment as soon as possible for a visit   Please follow-up with [facility] family practice to establish primary care      Future Appointments  No future appointments.     BP (!) 181/113   Pulse 66   Temp (!) 35.2 C (95.4 F)   Resp 18   Ht 1.88 m (6\' 2" )   Wt 81.6 kg (180 lb)   SpO2 99%   BMI 23.11 kg/m        Bonney Leitz, FNP12/17/2024              This note was partially created using voice recognition software and is inherently subject to errors including those of syntax and "sound alike " substitutions which may escape proof reading.  In such instances, original meaning may be extrapolated by contextual derivation.

## 2023-05-30 NOTE — Discharge Instructions (Addendum)
Please follow-up with the psychiatric care    Please establish primary care

## 2023-06-22 ENCOUNTER — Other Ambulatory Visit: Payer: Self-pay

## 2023-06-22 ENCOUNTER — Emergency Department
Admission: EM | Admit: 2023-06-22 | Discharge: 2023-06-23 | Discharge status: Against Medical Advice | Payer: MEDICAID | Attending: Emergency Medicine | Admitting: Emergency Medicine

## 2023-06-22 ENCOUNTER — Encounter (HOSPITAL_BASED_OUTPATIENT_CLINIC_OR_DEPARTMENT_OTHER): Payer: Self-pay

## 2023-06-22 DIAGNOSIS — F319 Bipolar disorder, unspecified: Secondary | ICD-10-CM | POA: Insufficient documentation

## 2023-06-22 DIAGNOSIS — R45851 Suicidal ideations: Secondary | ICD-10-CM

## 2023-06-22 DIAGNOSIS — Z8659 Personal history of other mental and behavioral disorders: Secondary | ICD-10-CM

## 2023-06-22 DIAGNOSIS — Z5329 Procedure and treatment not carried out because of patient's decision for other reasons: Secondary | ICD-10-CM | POA: Insufficient documentation

## 2023-06-22 LAB — ACETAMINOPHEN LEVEL: ACETAMINOPHEN LEVEL: 0 ug/mL — ABNORMAL LOW (ref 10.0–30.0)

## 2023-06-22 LAB — CBC WITH DIFF
BASOPHIL #: 0.03 10*3/uL (ref 0.00–0.30)
BASOPHIL %: 0 % (ref 0–3)
EOSINOPHIL #: 0.05 10*3/uL (ref 0.00–0.80)
EOSINOPHIL %: 1 % (ref 1–8)
HCT: 41.8 % — ABNORMAL LOW (ref 42.0–51.0)
HGB: 13.4 g/dL — ABNORMAL LOW (ref 13.5–18.0)
LYMPHOCYTE #: 1.82 10*3/uL (ref 1.10–5.00)
LYMPHOCYTE %: 26 % (ref 25–45)
MCH: 29.3 pg (ref 27.0–32.0)
MCHC: 31.9 g/dL — ABNORMAL LOW (ref 32.0–36.0)
MCV: 91.7 fL (ref 78.0–99.0)
MONOCYTE #: 0.43 10*3/uL (ref 0.00–1.30)
MONOCYTE %: 6 % (ref 0–12)
MPV: 8 fL (ref 7.4–10.4)
NEUTROPHIL #: 4.69 10*3/uL (ref 1.80–8.40)
NEUTROPHIL %: 67 % (ref 40–76)
PLATELETS: 274 10*3/uL (ref 140–440)
RBC: 4.56 10*6/uL (ref 4.20–6.00)
RDW: 15.9 % — ABNORMAL HIGH (ref 11.6–14.8)
WBC: 7 10*3/uL (ref 4.0–10.5)

## 2023-06-22 LAB — DRUG SCREEN, NO CONFIRMATION, URINE
AMPHETAMINES URINE: NEGATIVE
BARBITURATES URINE: NEGATIVE
BENZODIAZEPINES URINE: NEGATIVE
CANNABINOIDS URINE: NEGATIVE
COCAINE METABOLITES URINE: NEGATIVE
METHADONE URINE: NEGATIVE
OPIATES URINE: NEGATIVE
PCP URINE: NEGATIVE

## 2023-06-22 LAB — COMPREHENSIVE METABOLIC PANEL, NON-FASTING
ALBUMIN/GLOBULIN RATIO: 0.9 (ref 0.8–1.4)
ALBUMIN: 3.4 g/dL (ref 3.4–5.0)
ALKALINE PHOSPHATASE: 96 U/L (ref 46–116)
ALT (SGPT): 37 U/L (ref <=78)
ANION GAP: 10 mmol/L (ref 4–13)
AST (SGOT): 18 U/L (ref 15–37)
BILIRUBIN TOTAL: 0.3 mg/dL (ref 0.2–1.0)
BUN/CREA RATIO: 10
BUN: 10 mg/dL (ref 7–18)
CALCIUM, CORRECTED: 9.6 mg/dL
CALCIUM: 9.1 mg/dL (ref 8.5–10.1)
CHLORIDE: 104 mmol/L (ref 98–107)
CO2 TOTAL: 30 mmol/L (ref 21–32)
CREATININE: 0.99 mg/dL (ref 0.70–1.30)
ESTIMATED GFR: 101 mL/min/{1.73_m2} (ref >59)
GLOBULIN: 4
GLUCOSE: 84 mg/dL (ref 74–106)
OSMOLALITY, CALCULATED: 285 mosm/kg (ref 270–290)
POTASSIUM: 3.8 mmol/L (ref 3.5–5.1)
PROTEIN TOTAL: 7.4 g/dL (ref 6.4–8.2)
SODIUM: 144 mmol/L (ref 136–145)

## 2023-06-22 LAB — URINALYSIS, MICROSCOPIC

## 2023-06-22 LAB — URINALYSIS, MACRO/MICRO
BILIRUBIN: NEGATIVE mg/dL
BLOOD: NEGATIVE mg/dL
GLUCOSE: NEGATIVE mg/dL
KETONES: NEGATIVE mg/dL
NITRITE: NEGATIVE
PH: 6.5 (ref 4.6–8.0)
PROTEIN: NEGATIVE mg/dL
SPECIFIC GRAVITY: 1.01 (ref 1.003–1.035)
UROBILINOGEN: 0.2 mg/dL (ref 0.2–1.0)

## 2023-06-22 LAB — ETHANOL, SERUM/PLASMA: ETHANOL: <3 mg/dL (ref <=3)

## 2023-06-22 LAB — SALICYLATE ACID LEVEL: SALICYLATE LEVEL: 1 mg/dL — ABNORMAL LOW (ref 3–20)

## 2023-06-22 NOTE — ED Notes (Signed)
Personal belongings in locker #1

## 2023-06-22 NOTE — ED Provider Notes (Addendum)
[facility] emergency department         HISTORY OF PRESENT ILLNESS     Date:  06/22/2023  Patient's Name:  Donald Atkins  Date of Birth:  1985-11-02    Patient is a 38 year old presenting to the emergency room with one-week history of worsening suicidal thoughts.  Patient states he has a history of depression bipolar disorder.  Over last week he is having thoughts of hurting himself by jumping in front of a car.  He denied auditory or visual hallucination.  He denies homicidal thoughts.        Review of Systems     Review of Systems   Constitutional: Negative.    HENT: Negative.     Eyes: Negative.    Respiratory: Negative.     Cardiovascular: Negative.    Gastrointestinal: Negative.    Endocrine: Negative.    Musculoskeletal: Negative.    Skin: Negative.    Neurological: Negative.    Hematological: Negative.    Psychiatric/Behavioral:  Positive for suicidal ideas. The patient is nervous/anxious.    All other systems reviewed and are negative.      Previous History     Past Medical History:  Past Medical History:   Diagnosis Date    Bipolar disorder, unspecified (CMS HCC)     Borderline intellectual functioning     Convulsions (CMS HCC)     Convulsions (CMS HCC)     GAD (generalized anxiety disorder)     GERD (gastroesophageal reflux disease)     Mixed hyperlipidemia     Schizoaffective disorder (CMS HCC)     Schizophrenia (CMS HCC)        Past Surgical History:  History reviewed. No pertinent surgical history.    Social History:  Social History     Tobacco Use    Smoking status: Never     Passive exposure: Never    Smokeless tobacco: Never   Vaping Use    Vaping status: Never Used   Substance Use Topics    Alcohol use: Never    Drug use: Never     Social History     Substance and Sexual Activity   Drug Use Never       Family History:  No family history on file.    Medication History:  Current Outpatient Medications   Medication Sig    benztropine (COGENTIN) 1 mg Oral Tablet  Take 2 Tablets (2 mg total) by mouth Twice daily    haloperidoL (HALDOL) 10 mg Oral Tablet Take 1 Tablet (10 mg total) by mouth Twice daily    omeprazole (PRILOSEC) 20 mg Oral Capsule, Delayed Release(E.C.) Take 1 Capsule (20 mg total) by mouth Once a day    QUEtiapine (SEROQUEL) 100 mg Oral Tablet Take 1 Tablet (100 mg total) by mouth Twice daily       Allergies:  Allergies   Allergen Reactions    Latex     Penicillins Anaphylaxis    Latex        Physical Exam     Vitals:    BP (!) 142/87   Pulse 83   Temp 37.3 C (99.2 F)   Resp 17   Ht 1.88 m (6\' 2" )   Wt 81.6 kg (180 lb)   SpO2 98%   BMI 23.11 kg/m           Physical Exam  Vitals and nursing note reviewed.   Constitutional:  General: He is not in acute distress.     Appearance: Normal appearance. He is well-developed and normal weight.   HENT:      Head: Normocephalic and atraumatic.      Right Ear: Tympanic membrane normal.      Left Ear: Tympanic membrane normal.      Nose: Nose normal.      Mouth/Throat:      Mouth: Mucous membranes are moist.      Pharynx: Oropharynx is clear.   Eyes:      Extraocular Movements: Extraocular movements intact.      Conjunctiva/sclera: Conjunctivae normal.      Pupils: Pupils are equal, round, and reactive to light.   Cardiovascular:      Rate and Rhythm: Normal rate and regular rhythm.      Pulses: Normal pulses.      Heart sounds: No murmur heard.  Pulmonary:      Effort: Pulmonary effort is normal. No respiratory distress.      Breath sounds: Normal breath sounds.   Abdominal:      General: Bowel sounds are normal.      Palpations: Abdomen is soft.      Tenderness: There is no abdominal tenderness.   Musculoskeletal:         General: No swelling. Normal range of motion.      Cervical back: Normal range of motion and neck supple.   Skin:     General: Skin is warm and dry.      Capillary Refill: Capillary refill takes less than 2 seconds.   Neurological:      General: No focal deficit present.      Mental Status:  He is alert and oriented to person, place, and time.   Psychiatric:         Mood and Affect: Mood normal.         Thought Content: Thought content normal.         Diagnostic Studies/Treatment     Medications:       New Prescriptions    No medications on file       Labs:    Results for orders placed or performed during the hospital encounter of 06/22/23 (from the past 12 hour(s))   ACETAMINOPHEN LEVEL   Result Value Ref Range    ACETAMINOPHEN LEVEL 0.0 (L) 10.0 - 30.0 ug/mL   COMPREHENSIVE METABOLIC PANEL, NON-FASTING   Result Value Ref Range    SODIUM 144 136 - 145 mmol/L    POTASSIUM 3.8 3.5 - 5.1 mmol/L    CHLORIDE 104 98 - 107 mmol/L    CO2 TOTAL 30 21 - 32 mmol/L    ANION GAP 10 4 - 13 mmol/L    BUN 10 7 - 18 mg/dL    CREATININE 7.42 5.95 - 1.30 mg/dL    BUN/CREA RATIO 10     ESTIMATED GFR 101 >59 mL/min/1.60m^2    ALBUMIN 3.4 3.4 - 5.0 g/dL    CALCIUM 9.1 8.5 - 63.8 mg/dL    GLUCOSE 84 74 - 756 mg/dL    ALKALINE PHOSPHATASE 96 46 - 116 U/L    ALT (SGPT) 37 <=78 U/L    AST (SGOT) 18 15 - 37 U/L    BILIRUBIN TOTAL 0.3 0.2 - 1.0 mg/dL    PROTEIN TOTAL 7.4 6.4 - 8.2 g/dL    ALBUMIN/GLOBULIN RATIO 0.9 0.8 - 1.4    OSMOLALITY, CALCULATED 285 270 - 290 mOsm/kg    CALCIUM, CORRECTED  9.6 mg/dL    GLOBULIN 4.0    ETHANOL, SERUM   Result Value Ref Range    ETHANOL <3 <=3 mg/dL   SALICYLATE ACID LEVEL   Result Value Ref Range    SALICYLATE LEVEL 1 (L) 3 - 20 mg/dL   DRUG SCREEN, NO CONFIRMATION, URINE   Result Value Ref Range    AMPHETAMINES URINE Negative Negative    BARBITURATES URINE Negative Negative    BENZODIAZEPINES URINE Negative Negative    METHADONE URINE Negative Negative    COCAINE METABOLITES URINE Negative Negative    OPIATES URINE Negative Negative    PCP URINE Negative Negative    CANNABINOIDS URINE Negative Negative   CBC WITH DIFF   Result Value Ref Range    WBC 7.0 4.0 - 10.5 x10^3/uL    RBC 4.56 4.20 - 6.00 x10^6/uL    HGB 13.4 (L) 13.5 - 18.0 g/dL    HCT 10.2 (L) 72.5 - 51.0 %    MCV 91.7 78.0 - 99.0  fL    MCH 29.3 27.0 - 32.0 pg    MCHC 31.9 (L) 32.0 - 36.0 g/dL    RDW 36.6 (H) 44.0 - 14.8 %    PLATELETS 274 140 - 440 x10^3/uL    MPV 8.0 7.4 - 10.4 fL    NEUTROPHIL % 67 40 - 76 %    LYMPHOCYTE % 26 25 - 45 %    MONOCYTE % 6 0 - 12 %    EOSINOPHIL % 1 1 - 8 %    BASOPHIL % 0 0 - 3 %    NEUTROPHIL # 4.69 1.80 - 8.40 x10^3/uL    LYMPHOCYTE # 1.82 1.10 - 5.00 x10^3/uL    MONOCYTE # 0.43 0.00 - 1.30 x10^3/uL    EOSINOPHIL # 0.05 0.00 - 0.80 x10^3/uL    BASOPHIL # 0.03 0.00 - 0.30 x10^3/uL   URINALYSIS, MACRO/MICRO   Result Value Ref Range    COLOR Light Yellow Light Yellow, Yellow    APPEARANCE Clear Clear    SPECIFIC GRAVITY 1.010 1.003 - 1.035    PH 6.5 4.6 - 8.0    LEUKOCYTES Small (A) Negative WBCs/uL    NITRITE Negative Negative    PROTEIN Negative Negative mg/dL    GLUCOSE Negative Negative mg/dL    KETONES Negative Negative mg/dL    BILIRUBIN Negative Negative mg/dL    BLOOD Negative Negative mg/dL    UROBILINOGEN 0.2 0.2 - 1.0 mg/dL   URINALYSIS, MICROSCOPIC   Result Value Ref Range    RBCS 0-3 0-3, Not Present /hpf    BACTERIA Rare (A) Negative /hpf    MUCOUS Few Rare, Occasional, Few /hpf    WBCS 0-5 Not Present, Occasional, 0-5 /hpf    SQUAMOUS EPITHELIAL Few Not Present, Few /hpf        Radiology:  None    No orders to display       ECG:  NONE            Differential diagnosis  1. Suicidal ideation 2. Depression 3. History of schizophrenia    Course/Disposition/Plan     Course:     ED Course as of 06/23/23 0011   Thu Jun 22, 2023   2053 Patient medically cleared for inpatient psychiatry evaluation   Fri Jun 23, 2023   0009 Patient now states he is not suicidal or homicidal.  Patient requests to be discharged home.       Disposition:    Discharged  Condition at Disposition:   Stable    Follow up:   Benn[facility] Alm Ste[facility] -  [address] [place] [address]  [phone]    Schedule an appointment as soon as possible for a visit in 3 days        Clinical Impression:     Clinical  Impression   History of depression (Primary)         Tivis Ringer, MD

## 2023-06-22 NOTE — ED Nurses Note (Signed)
Patient states that SI began this morning, patient states that he has a plan, by running in front of a car, patient states he has previous thoughts and attempts in the past. Patient states that he does take medication for depression, patients states that he does want to get help, states that stress brought on the SI

## 2023-06-23 ENCOUNTER — Encounter (HOSPITAL_COMMUNITY): Payer: Self-pay

## 2023-06-23 ENCOUNTER — Emergency Department (EMERGENCY_DEPARTMENT_HOSPITAL)
Admission: EM | Admit: 2023-06-23 | Discharge: 2023-06-23 | Disposition: A | Discharge status: Home / Self Care | Payer: MEDICAID | Source: Home / Self Care | Attending: Emergency Medicine | Admitting: Emergency Medicine

## 2023-06-23 DIAGNOSIS — N390 Urinary tract infection, site not specified: Secondary | ICD-10-CM | POA: Insufficient documentation

## 2023-06-23 DIAGNOSIS — F79 Unspecified intellectual disabilities: Secondary | ICD-10-CM | POA: Insufficient documentation

## 2023-06-23 DIAGNOSIS — Z79899 Other long term (current) drug therapy: Secondary | ICD-10-CM | POA: Insufficient documentation

## 2023-06-23 DIAGNOSIS — F209 Schizophrenia, unspecified: Secondary | ICD-10-CM | POA: Insufficient documentation

## 2023-06-23 DIAGNOSIS — F319 Bipolar disorder, unspecified: Secondary | ICD-10-CM | POA: Insufficient documentation

## 2023-06-23 LAB — GOLD TOP TUBE

## 2023-06-23 LAB — ACETAMINOPHEN LEVEL: ACETAMINOPHEN LEVEL: <10 ug/mL — ABNORMAL LOW (ref 10–30)

## 2023-06-23 LAB — DRUG SCREEN, NO CONFIRMATION, URINE
AMPHETAMINES URINE: NEGATIVE
BARBITURATES URINE: NEGATIVE
BENZODIAZEPINES URINE: NEGATIVE
BUPRENORPHINE URINE: NEGATIVE
CANNABINOIDS URINE: NEGATIVE
COCAINE METABOLITES URINE: NEGATIVE
FENTANYL, URINE: NEGATIVE
METHADONE URINE: NEGATIVE
OPIATES URINE: NEGATIVE
OXYCODONE URINE: NEGATIVE
PCP URINE: NEGATIVE

## 2023-06-23 LAB — SALICYLATE ACID LEVEL: SALICYLATE LEVEL: <3 mg/dL (ref <=30)

## 2023-06-23 LAB — URINALYSIS, MACROSCOPIC
BILIRUBIN: NEGATIVE mg/dL
BLOOD: NEGATIVE mg/dL
GLUCOSE: NEGATIVE mg/dL
LEUKOCYTES: 75 WBCs/uL — AB
NITRITE: NEGATIVE
PH: 6.5 (ref 5.0–9.0)
PROTEIN: NEGATIVE mg/dL
SPECIFIC GRAVITY: 1.015 (ref 1.002–1.030)
UROBILINOGEN: NORMAL mg/dL

## 2023-06-23 LAB — URINALYSIS, MICROSCOPIC
RBCS: 1 /[HPF] (ref <4)
SQUAMOUS EPITHELIAL: <1 /[HPF] (ref <28)
WBCS: 11 /[HPF] — ABNORMAL HIGH (ref <6)

## 2023-06-23 LAB — LAVENDER TOP TUBE

## 2023-06-23 LAB — THYROID STIMULATING HORMONE (SENSITIVE TSH): TSH: 1.379 u[IU]/mL (ref 0.450–5.330)

## 2023-06-23 MED ORDER — DOXYCYCLINE HYCLATE 100 MG CAPSULE
100.0000 mg | ORAL_CAPSULE | Freq: Two times a day (BID) | ORAL | 0 refills | Status: AC
Start: 2023-06-23 — End: 2023-07-03

## 2023-06-23 MED ORDER — CEFTRIAXONE 500 MG SOLUTION FOR INJECTION
INTRAMUSCULAR | Status: AC
Start: 2023-06-23 — End: 2023-06-23
  Filled 2023-06-23: qty 5

## 2023-06-23 MED ORDER — LIDOCAINE HCL 10 MG/ML (1 %) INJECTION SOLUTION
INTRAMUSCULAR | Status: AC
Start: 2023-06-23 — End: 2023-06-23
  Filled 2023-06-23: qty 50

## 2023-06-23 MED ORDER — HYDROXYZINE PAMOATE 25 MG CAPSULE
25.0000 mg | ORAL_CAPSULE | Freq: Four times a day (QID) | ORAL | 0 refills | Status: AC | PRN
Start: 2023-06-23 — End: ?

## 2023-06-23 MED ORDER — DOXYCYCLINE HYCLATE 100 MG TABLET
100.0000 mg | ORAL_TABLET | ORAL | Status: AC
Start: 2023-06-23 — End: 2023-06-23
  Administered 2023-06-23: 100 mg via ORAL

## 2023-06-23 MED ORDER — LIDOCAINE HCL 10 MG/ML (1 %) INJECTION SOLUTION
500.0000 mg | INTRAMUSCULAR | Status: AC
Start: 2023-06-23 — End: 2023-06-23
  Administered 2023-06-23: 500 mg via INTRAMUSCULAR

## 2023-06-23 MED ORDER — DOXYCYCLINE HYCLATE 100 MG TABLET
ORAL_TABLET | ORAL | Status: AC
Start: 2023-06-23 — End: 2023-06-23
  Filled 2023-06-23: qty 1

## 2023-06-23 MED ORDER — MIRTAZAPINE 15 MG TABLET
15.0000 mg | ORAL_TABLET | Freq: Every evening | ORAL | 0 refills | Status: AC
Start: 2023-06-23 — End: 2023-07-07

## 2023-06-23 MED ORDER — VALPROIC ACID 250 MG CAPSULE
750.0000 mg | ORAL_CAPSULE | Freq: Two times a day (BID) | ORAL | 0 refills | Status: AC
Start: 2023-06-23 — End: 2023-07-07

## 2023-06-23 NOTE — ED Nurses Note (Signed)
Pt asking to leave facility at this time. Pt reminded of appointment at [facility] this AM. Pt left department at this time.

## 2023-06-23 NOTE — Discharge Instructions (Signed)
Drink plenty of fluids  Take doxycycline twice a day for the next 10 days for urinary tract infection  Take other medications as prescribed  Follow up with [facility] this morning as scheduled at 8:00 a.m.

## 2023-06-23 NOTE — ED Triage Notes (Addendum)
Wanting medical screening to go to [facility]. States he is out of his medication and needs refills. Denies SI/HI.   Goes on to state he hears voices but "they're very tiny."

## 2023-06-23 NOTE — ED Nurses Note (Signed)
Patient asked if he had SI/ HI, patient verbalized that at this time he does not have SI/ HI, states that he can control them, and does not want to stay.

## 2023-06-23 NOTE — ED Provider Notes (Signed)
[facility]  ED Primary Provider Note        Arrival: The patient arrived by Ambulance     History of Present Illness   chief complaint  Donald Atkins is a 38 y.o. male who had concerns including Medical Screening.  Patient 38 year old male presents emergency room with a chief complaint of being out of his psychiatric medications.  Patient recently admitted to inpatient [facility] hospital.  Patient is discharged home on Cogentin, Depakote, Atarax, Remeron, and is to follow up for Invega injections at [facility].  Patient does have his Cogentin but does not have his Depakote, hydroxyzine, Remeron.  Is denying any SI/HI.  Is very lucid and very pleasant.  Denying any hallucinations.  Patient does have an 8:00 a.m. appointment with [facility] for his Invega injections.  Patient very pleasant and cooperative.  All nursing notes reviewed      Review of Systems     No other overt Review of Systems are noted to be positive except noted in the HPI.      Historical Data   History Reviewed This Encounter: Medical History  Surgical History  Family History  Social History      Physical Exam   ED Triage Vitals [06/23/23 0223]   BP (Non-Invasive) (!) 143/91   Heart Rate 87   Respiratory Rate 19   Temperature 36.5 C (97.7 F)   SpO2 96 %   Weight 84.8 kg (187 lb)   Height 1.854 m (6\' 1" )         Exam:   Constitutional:  Patient alert orient x3 in no apparent distress.  No limitations.  Head: Atraumatic normocephalic  Eyes :  Pupils are equal round reactive to light and accommodation extraocular muscles are intact.  Sclera and conjunctiva are unremarkable  Ears:  Tympanic membranes are pearly gray bilaterally; external auditory canals are unremarkable; external ears without any lesions  Nose:  Nares are patent turbinates are pink and moist  Mouth:  Mucosa is pink and moist without lesions.  Posterior pharynx is pink and moist without hypertrophy/exudate.  Neck:  Soft and supple  without palpable lymphadenopathy.  Heart:  Regular rate and rhythm without audible murmur  Lungs:  Clear to auscultation bilaterally without any wheezing/rales/rhonchi  Abdomen:  Soft nontender without any rebound or guarding; positive bowel sounds throughout  Genitalia:  Deferred  Skin:  Warm and dry without lesions.  Normal skin turgor.  Brisk capillary refill distally  Extremities:  Good strength bilaterally with full range of motion of upper and lower extremities.  Neuro:  Alert oriented x3.  Cranial nerves II-XII grossly intact as tested.  Excellent sensation distally over all dermatomes.  Psychiatric:  Patient cooperative, affect appropriate, denying any hallucinations.  Denying any SI/HI insight and judgment good          Procedures           Patient Data     Labs Ordered/Reviewed   ACETAMINOPHEN LEVEL - Abnormal; Notable for the following components:       Result Value    ACETAMINOPHEN LEVEL <10 (*)     All other components within normal limits   URINALYSIS, MACROSCOPIC - Abnormal; Notable for the following components:    LEUKOCYTES 75 (*)     All other components within normal limits   URINALYSIS, MICROSCOPIC - Abnormal; Notable for the following components:    BUDDING YEAST Few (*)     WBCS 11 (*)  All other components within normal limits   DRUG SCREEN, NO CONFIRMATION, URINE - Normal    Narrative:     Any results reported as "positive" on this urine drug screen are unconfirmed screening results and should be used for medical(i.e.,treatment)purposes only. Unconfirmed screening results must not be used for non-medical purposes (e.g. employment or legal testing). Upon request, all results reported as "positive" can be sent to a reference laboratory for confirmation by GCMS.     Reporting Limits (cut-off concentrations)     Cocaine 300 ng/mL  Opiates 300 ng/mL  THC 50 ng/mL  Amphetamine 1000 ng/mL  Phencyclidine 25 ng/mL  Benzodiazepine 300 ng/mL  Barbiturates 300 ng/mL  Methadone 300 ng/mL  Oxycodone 100  ng/mL  Buprenorphine 5 ng/mL  Fentanyl 5 ng/mL     THYROID STIMULATING HORMONE (SENSITIVE TSH) - Normal   SALICYLATE ACID LEVEL - Normal   URINE CULTURE,ROUTINE   URINALYSIS, MACROSCOPIC AND MICROSCOPIC W/CULTURE REFLEX    Narrative:     The following orders were created for panel order URINALYSIS, MACROSCOPIC AND MICROSCOPIC W/CULTURE REFLEX.  Procedure                               Abnormality         Status                     ---------                               -----------         ------                     URINALYSIS, MACROSCOPIC[[id number]]      Abnormal            Final result               URINALYSIS, MICROSCOPIC[[id number]]      Abnormal            Final result                 Please view results for these tests on the individual orders.   EXTRA TUBES    Narrative:     The following orders were created for panel order EXTRA TUBES.  Procedure                               Abnormality         Status                     ---------                               -----------         ------                     GOLD TOP WUXL[[id number]]                                    In process                 LAVENDER TOP ZDGU[[id number]]  In process                   Please view results for these tests on the individual orders.   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  Is currently out of his Depakote 250 mg 3 tablets b.i.d..; Hydroxyzine 25 mg 1 tablet every 6 hours p.r.n.; Remeron 1 tablet 15 mg q.h.s..  I did discuss this in detail with the patient.  I will write him for 2 week supply to bridge him until he has a official appointment with [facility].  Is a follow up with [facility] for is regular prescriptions.  Is to follow up with [facility] at 8:00 a.m. for Hawi injection.  Patient did have CBC and CMP done a [place].  It is  unremarkable.  EKG is unremarkable.  Urinalysis does show a mild urinary tract infection.  Patient given ceftriaxone 500 mg IM along with doxycycline 100 mg p.o. here in emergency room.  We will discharge him home on 100 mg p.o. b.i.d..  See discharge instructions for detailed    Amount and/or Complexity of Data Reviewed  Labs: ordered. Decision-making details documented in ED Course.  ECG/medicine tests: ordered and independent interpretation performed. Decision-making details documented in ED Course.    Risk  Prescription drug management.    Critical Care  Total time providing critical care: 0 minutes        ED Course as of 06/23/23 0603   Fri Jun 23, 2023   0231 Patient seen at [facility] earlier in the evening.  Had unremarkable CBC and CMP there.  We will obtain UA/UDS/TSH/salicylate/acetaminophen/ethanol levels.   0248 EKG shows normal sinus rhythm with a heart rate of 82, normal axis, normal R-wave progression, no ectopy, no ischemia, normal PR/QRS interval.   0315 Urinalysis shows 75 leukocyte esterase with a 11 white blood cells per high-power field.  Is budding yeast, negative nitrites, only 1 red blood cell per high-powered field   0327 UDS negative for all tested substances   0554 TSH 1.37   0603 Salicylate negative, acetaminophen negative         Medications Administered in the ED   cefTRIAXone (ROCEPHIN) 500 mg in lidocaine 1.43 mL (tot vol) IM injection (500 mg IntraMUSCULAR Given 06/23/23 0332)   doxycycline tablet (100 mg Oral Given 06/23/23 0331)       Following the history, physical exam, and ED workup, the patient was deemed stable and suitable for discharge. The patient/caregiver was advised to return to the ED for any new or worsening symptoms. Discharge medications, and follow-up instructions were discussed with the patient/caregiver in detail, who verbalizes understanding. The patient/caregiver is in agreement and is comfortable with the plan of care.    Disposition: Discharged         Current  Discharge Medication List        START taking these medications.        Details   doxycycline hyclate 100 mg Capsule  Commonly known as: VIBRAMYCIN   100 mg, Oral, 2 TIMES DAILY  Qty: 20 Capsule  Refills: 0     hydrOXYzine pamoate 25 mg Capsule  Commonly known as: VISTARIL   25 mg, Oral, 4 TIMES DAILY PRN  Qty: 20 Capsule  Refills:  0     mirtazapine 15 mg Tablet  Commonly known as: REMERON   15 mg, Oral, NIGHTLY  Qty: 14 Tablet  Refills: 0     valproic acid 250 mg Capsule  Commonly known as: DEPAKENE   750 mg, Oral, EVERY 12 HOURS  Qty: 84 Capsule  Refills: 0            CONTINUE these medications - NO CHANGES were made during your visit.        Details   benztropine 1 mg Tablet  Commonly known as: COGENTIN   2 mg, Oral, 2 TIMES DAILY  Refills: 0     haloperidoL 10 mg Tablet  Commonly known as: HALDOL   10 mg, Oral, 2 TIMES DAILY  Refills: 0     omeprazole 20 mg Capsule, Delayed Release(E.C.)  Commonly known as: PRILOSEC   20 mg, Oral, DAILY  Refills: 0     QUEtiapine 100 mg Tablet  Commonly known as: SEROquel   100 mg, Oral, 2 TIMES DAILY  Refills: 0            Follow up:   [facility] [place] -  [address] [place] [address]  [phone]      Follow up with [facility] this morning                 Clinical Impression   Schizophrenia (CMS HCC) (Primary)   Bipolar affective disorder, remission status unspecified (CMS HCC)   Urinary tract infection         Current Discharge Medication List        START taking these medications    Details   doxycycline hyclate (VIBRAMYCIN) 100 mg Oral Capsule Take 1 Capsule (100 mg total) by mouth Twice daily for 10 days  Qty: 20 Capsule, Refills: 0      hydrOXYzine pamoate (VISTARIL) 25 mg Oral Capsule Take 1 Capsule (25 mg total) by mouth Four times a day as needed for Itching  Qty: 20 Capsule, Refills: 0      mirtazapine (REMERON) 15 mg Oral Tablet Take 1 Tablet (15 mg total) by mouth Every night for 14 days  Qty: 14 Tablet, Refills: 0      valproic  acid (DEPAKENE) 250 mg Oral Capsule Take 3 Capsules (750 mg total) by mouth Every 12 hours for 14 days  Qty: 84 Capsule, Refills: 0             R.A. Santiago Bumpers, DO  Department of Emergency Medicine

## 2023-06-23 NOTE — ED Nurses Note (Signed)
Pt presents to the ER requesting medical clearance for [facility]. Pt states he does not have SI/HI and just wants to get his medications and go through the program at [facility]. Pt is calm, polite and cooperative during assessment. No further needs identified at this time.

## 2023-06-23 NOTE — ED Nurses Note (Signed)
Pt provided copy of AVS and discharge instructions as well as prescriptions. Pt adv to return to the ER in the event of an emergency. All questions answered at this time. Pt resting in room until appointment at [facility] this morning.

## 2023-06-23 NOTE — ED Nurses Note (Signed)
Per MD evaluation patient can sign AMA.  Patient denies SI/HI.  States he is going to go to his sister's house and has a Water engineer.  Agreed to return if he has SI/HI.  AMA form signed by patient  Belongings given to patient.  Ambulatory out of ER with no further complaints.

## 2023-06-25 LAB — URINE CULTURE,ROUTINE
URINE CULTURE: NO GROWTH
URINE CULTURE: NO GROWTH

## 2023-06-26 DIAGNOSIS — Z136 Encounter for screening for cardiovascular disorders: Secondary | ICD-10-CM

## 2023-06-26 LAB — ECG 12 LEAD
Atrial Rate: 82 {beats}/min
Calculated P Axis: 44 degrees
Calculated R Axis: 42 degrees
Calculated T Axis: 21 degrees
PR Interval: 148 ms
QRS Duration: 84 ms
QT Interval: 340 ms
QTC Calculation: 397 ms
Ventricular rate: 82 {beats}/min

## 2023-07-24 ENCOUNTER — Emergency Department
Admission: EM | Admit: 2023-07-24 | Discharge: 2023-07-25 | Disposition: A | Discharge status: Other Acute Inpatient Hospital | Payer: MEDICAID

## 2023-07-24 ENCOUNTER — Other Ambulatory Visit: Payer: Self-pay

## 2023-07-24 ENCOUNTER — Encounter (HOSPITAL_BASED_OUTPATIENT_CLINIC_OR_DEPARTMENT_OTHER): Payer: Self-pay

## 2023-07-24 DIAGNOSIS — R45851 Suicidal ideations: Secondary | ICD-10-CM | POA: Insufficient documentation

## 2023-07-24 DIAGNOSIS — R440 Auditory hallucinations: Secondary | ICD-10-CM | POA: Insufficient documentation

## 2023-07-24 LAB — COMPREHENSIVE METABOLIC PANEL, NON-FASTING
ALBUMIN/GLOBULIN RATIO: 1.2 (ref 0.8–1.4)
ALBUMIN: 4.4 g/dL (ref 3.4–5.0)
ALKALINE PHOSPHATASE: 115 U/L (ref 46–116)
ALT (SGPT): 78 U/L (ref <=78)
ANION GAP: 12 mmol/L (ref 4–13)
AST (SGOT): 42 U/L — ABNORMAL HIGH (ref 15–37)
BILIRUBIN TOTAL: 0.8 mg/dL (ref 0.2–1.0)
BUN/CREA RATIO: 6
BUN: 6 mg/dL — ABNORMAL LOW (ref 7–18)
CALCIUM, CORRECTED: 9.3 mg/dL
CALCIUM: 9.6 mg/dL (ref 8.5–10.1)
CHLORIDE: 104 mmol/L (ref 98–107)
CO2 TOTAL: 25 mmol/L (ref 21–32)
CREATININE: 0.95 mg/dL (ref 0.70–1.30)
ESTIMATED GFR: 106 mL/min/{1.73_m2} (ref >59)
GLOBULIN: 3.7
GLUCOSE: 100 mg/dL (ref 74–106)
OSMOLALITY, CALCULATED: 279 mosm/kg (ref 270–290)
POTASSIUM: 3.8 mmol/L (ref 3.5–5.1)
PROTEIN TOTAL: 8.1 g/dL (ref 6.4–8.2)
SODIUM: 141 mmol/L (ref 136–145)

## 2023-07-24 LAB — ACETAMINOPHEN LEVEL: ACETAMINOPHEN LEVEL: 0 ug/mL — ABNORMAL LOW (ref 10.0–30.0)

## 2023-07-24 LAB — CBC WITH DIFF
BASOPHIL #: 0.01 10*3/uL (ref 0.00–0.10)
BASOPHIL %: 0 % (ref 0–1)
EOSINOPHIL #: 0.04 10*3/uL (ref 0.00–0.50)
EOSINOPHIL %: 1 % (ref 1–8)
HCT: 46.2 % (ref 36.7–47.1)
HGB: 14.9 g/dL (ref 12.5–16.3)
LYMPHOCYTE #: 1.39 10*3/uL (ref 1.00–3.20)
LYMPHOCYTE %: 25 % (ref 15–43)
MCH: 29.2 pg (ref 23.8–33.4)
MCHC: 32.3 g/dL — ABNORMAL LOW (ref 32.5–36.3)
MCV: 90.3 fL (ref 73.0–96.2)
MONOCYTE #: 0.31 10*3/uL (ref 0.30–1.10)
MONOCYTE %: 5 % — ABNORMAL LOW (ref 6–14)
MPV: 7.7 fL (ref 7.4–11.4)
NEUTROPHIL #: 3.92 10*3/uL (ref 1.70–7.60)
NEUTROPHIL %: 69 % (ref 44–74)
PLATELETS: 250 10*3/uL (ref 140–440)
RBC: 5.12 10*6/uL (ref 4.06–5.63)
RDW: 16.2 % (ref 12.1–16.2)
WBC: 5.7 10*3/uL (ref 3.6–10.2)

## 2023-07-24 LAB — URINALYSIS, MACRO/MICRO
BILIRUBIN: NEGATIVE mg/dL
BLOOD: NEGATIVE mg/dL
GLUCOSE: NEGATIVE mg/dL
KETONES: NEGATIVE mg/dL
LEUKOCYTES: NEGATIVE WBCs/uL
NITRITE: NEGATIVE
PH: 6.5 (ref 4.6–8.0)
PROTEIN: NEGATIVE mg/dL
SPECIFIC GRAVITY: 1.01 (ref 1.003–1.035)
UROBILINOGEN: 0.2 mg/dL (ref 0.2–1.0)

## 2023-07-24 LAB — DRUG SCREEN, NO CONFIRMATION, URINE
AMPHETAMINES URINE: NEGATIVE
BARBITURATES URINE: NEGATIVE
BENZODIAZEPINES URINE: NEGATIVE
CANNABINOIDS URINE: NEGATIVE
COCAINE METABOLITES URINE: NEGATIVE
METHADONE URINE: NEGATIVE
OPIATES URINE: NEGATIVE
PCP URINE: NEGATIVE

## 2023-07-24 LAB — SALICYLATE ACID LEVEL: SALICYLATE LEVEL: <3 mg/dL (ref 0–20)

## 2023-07-24 LAB — ETHANOL, SERUM/PLASMA: ETHANOL: <3 mg/dL (ref <=3)

## 2023-07-24 LAB — THYROID STIMULATING HORMONE (SENSITIVE TSH): TSH: 0.931 u[IU]/mL (ref 0.358–3.740)

## 2023-07-24 MED ORDER — HYDROXYZINE PAMOATE 25 MG CAPSULE
25.0000 mg | ORAL_CAPSULE | ORAL | Status: AC
Start: 2023-07-24 — End: 2023-07-24
  Administered 2023-07-24: 25 mg via ORAL

## 2023-07-24 MED ORDER — HYDROXYZINE PAMOATE 25 MG CAPSULE
ORAL_CAPSULE | ORAL | Status: AC
Start: 2023-07-24 — End: 2023-07-24
  Filled 2023-07-24: qty 1

## 2023-07-24 NOTE — ED Nurses Note (Signed)
Pt changed into paper scrubs and non skid socks, all belongings placed in locker, pt placed in hall in chair, d/t no rooms available, sitter in hall with pt pt denies drugs and alcohol, pt states he has had many SI attempts and thoughts in the last few years.

## 2023-07-24 NOTE — ED Notes (Signed)
Patient Provided with sandwich tray and soda, [place] at bedside with Patient

## 2023-07-24 NOTE — ED Notes (Signed)
Patient pacing back and forth in room talking to his self patient keeps asking when he is going to be able to go somewhere. Explained to patient that we are waiting to here back from the facilities that we have faxed information to and patient states "Nobody will take me, Everytime I go through this they always have to commit me to the [facility], so why can't you just commit me like they always do" Explained to patient that we would have to check voluntary inpatient psych before we could refer to be committed.

## 2023-07-24 NOTE — ED Notes (Signed)
[facility] has no beds  [facility] has no beds

## 2023-07-24 NOTE — ED Notes (Signed)
Patient spoke with crisis worker at [facility], told her he didn't want to go there because he would "just eat paint chips and shit because I like to swallow stuff and ya'll don't do anything for me there anyway." Provider notified.   Referral sent to [facility] at this time, they do have male beds.

## 2023-07-24 NOTE — ED Nurses Note (Signed)
Patient given po fluids and meal tray upon request.

## 2023-07-24 NOTE — ED Nurses Note (Signed)
Pt witnessed walking out door trying to leave. Redirected pt back to room. Pt is yelling at staff stating "I want to talk to someone. I've been trying to talk to a doctor and no one has come in here." Advised pt ht has spoken to the doctor, pt continues to talk over me. Dr. Cristela Felt to room speaking with patient at this time.

## 2023-07-24 NOTE — ED Nurses Note (Signed)
Attorney on phone speaking with pt at this time.

## 2023-07-24 NOTE — ED Provider Notes (Signed)
[facility]  ED Primary Provider Note  Patient Name: Donald Atkins  Patient Age: 38 y.o.  Date of Birth: 15-May-1986    Chief Complaint: Hallucinations (Pt hears voices telling him to kill himself, pt admits he would like to jump in front of moving car. )        History of Present Illness       Zackaria Burkey is a 38 y.o. male who had concerns including Hallucinations.  Patient reports suicidal ideation with a plan.  He states that he has been hearing voices for the past few days telling him to hurt himself.  He states his plan is to jump in front of a moving vehicle.  Patient denies any pain nausea vomiting fever chills shortness breath changes in bowel habits painful urination.      History provided by:  Patient  Hallucinations  Location:  Hearing voices  Severity:  Moderate  Onset quality:  Sudden  Duration:  2 days  Timing:  Intermittent  Progression:  Unchanged  Context:  The patient is hearing voices telling him to hurt himself  Associated symptoms: no abdominal pain, no chest pain, no diarrhea, no fever, no headaches, no loss of consciousness, no nausea, no shortness of breath, no vomiting and no wheezing         Review of Systems     No other overt Review of Systems are noted to be positive except noted in the HPI.      Historical Data   History Reviewed This Encounter: Medical History  Surgical History  Family History  Social History      Physical Exam   ED Triage Vitals [07/24/23 1246]   BP (Non-Invasive) (!) 140/92   Heart Rate 77   Respiratory Rate 16   Temperature 36.7 C (98 F)   SpO2 100 %   Weight 84.8 kg (187 lb)   Height 1.854 m (6\' 1" )       Physical Exam  Vitals and nursing note reviewed.   Constitutional:       Appearance: Normal appearance. He is normal weight.   HENT:      Head: Normocephalic and atraumatic.      Mouth/Throat:      Mouth: Mucous membranes are moist.      Pharynx: Oropharynx is clear.   Eyes:      Pupils: Pupils are equal, round, and  reactive to light.   Cardiovascular:      Rate and Rhythm: Normal rate and regular rhythm.      Pulses: Normal pulses.      Heart sounds: Normal heart sounds.   Pulmonary:      Effort: Pulmonary effort is normal.      Breath sounds: Normal breath sounds.   Abdominal:      General: Abdomen is flat. Bowel sounds are normal.      Palpations: Abdomen is soft.   Musculoskeletal:         General: Normal range of motion.      Cervical back: Normal range of motion and neck supple.   Skin:     General: Skin is warm and dry.      Capillary Refill: Capillary refill takes less than 2 seconds.   Neurological:      Mental Status: He is alert and oriented to person, place, and time.   Psychiatric:         Attention and Perception: He perceives auditory hallucinations.  Mood and Affect: Mood normal. Affect is blunt.         Speech: Speech normal.         Behavior: Behavior normal.         Thought Content: Thought content includes suicidal ideation. Thought content does not include homicidal ideation. Thought content includes suicidal plan. Thought content does not include homicidal plan.          Patient Data     Labs Ordered/Reviewed   COMPREHENSIVE METABOLIC PANEL, NON-FASTING - Abnormal; Notable for the following components:       Result Value    BUN 6 (*)     AST (SGOT) 42 (*)     All other components within normal limits    Narrative:     Estimated Glomerular Filtration Rate (eGFR) is calculated using the CKD-EPI (2021) equation, intended for patients 68 years of age and older. If gender is not documented or "unknown", there will be no eGFR calculation.   ACETAMINOPHEN LEVEL - Abnormal; Notable for the following components:    ACETAMINOPHEN LEVEL 0.0 (*)     All other components within normal limits   CBC WITH DIFF - Abnormal; Notable for the following components:    MCHC 32.3 (*)     MONOCYTE % 5 (*)     All other components within normal limits   DRUG SCREEN, NO CONFIRMATION, URINE - Normal   URINALYSIS, MACRO/MICRO -  Normal   ETHANOL, SERUM/PLASMA - Normal   SALICYLATE ACID LEVEL - Normal   THYROID STIMULATING HORMONE (SENSITIVE TSH) - Normal   URINALYSIS WITH REFLEX MICROSCOPIC AND CULTURE IF POSITIVE    Narrative:     The following orders were created for panel order URINALYSIS WITH REFLEX MICROSCOPIC AND CULTURE IF POSITIVE.  Procedure                               Abnormality         Status                     ---------                               -----------         ------                     URINALYSIS, MACRO/MICRO[[id number]]      Normal              Final result                 Please view results for these tests on the individual orders.   CBC/DIFF    Narrative:     The following orders were created for panel order CBC/DIFF.  Procedure                               Abnormality         Status                     ---------                               -----------         ------  CBC WITH DIFF[[id number]]                Abnormal            Final result                 Please view results for these tests on the individual orders.   VALPROIC ACID LEVEL       No orders to display       Medical Decision Making          Medical Decision Making  Amount and/or Complexity of Data Reviewed  Labs: ordered.          Studies Assessed and/or Ordered:  Labs urine drug screen UA      MDM Narrative:    Suicidal ideation.  Auditory hallucinations.  Patient reports suicidal ideation with a plan.  He states that he has been hearing voices for the past few days telling him to hurt himself.  He states his plan is to jump in front of a moving vehicle.  Patient denies any pain nausea vomiting fever chills shortness breath changes in bowel habits painful urination.  CBC CMP unremarkable.  Serum ethanol less than 3.  TSH 0.931.  Urine drug screen negative.  UA shows no evidence of urinary tract infection.  Acetaminophen 0.0.  Salicylate acid less than 3.    Based on the above history, physical exam, and tests obtained as  indicated, the Emergency Department evaluation has identified no acute or active medical issues that warrant further emergency department intervention or precludes discharge of the patient in the company of law enforcement or transfer to a psychiatric facility.                       Based on the history, physical exam, and tests obtained as indicated, the Emergency Department evaluation has identified no acute or active medical issues that warrant further emergency department intervention or precludes discharge of the patient in the company of law enforcement or transfer to a psychiatric facility.    Disposition: Psych Facility                   Clinical Impression   Suicidal ideation (Primary)   Auditory hallucinations         Current Discharge Medication List            /M. Gweneth Fredlund, FNP-BC  Department of Emergency Medicine  [facility]

## 2023-07-24 NOTE — ED Nurses Note (Signed)
Patient pacing the floor at this time in room, sitter remains at bedside.

## 2023-07-24 NOTE — ED Nurses Note (Signed)
Received hold order from [facility].

## 2023-07-24 NOTE — ED Nurses Note (Signed)
Pt is resting with eyes closed at this time. Equal chest rise and fall w/ no s/s of acute distress noted at this time.

## 2023-07-24 NOTE — ED Notes (Signed)
Paperwork received from [facility], patient will be going to [facility]  Awaiting sheriffs department for transport

## 2023-07-24 NOTE — ED Nurses Note (Signed)
[place] here speaking with pt at this time.

## 2023-07-24 NOTE — ED Notes (Signed)
Committal papers faxed to [facility]

## 2023-07-24 NOTE — ED Nurses Note (Signed)
Patient requesting medication for anxiety at this time, pacing back and forth in room. Provider informed.

## 2023-07-24 NOTE — ED Nurses Note (Signed)
Patient provided with a snack as per request. No complaints voiced at this time. Patient did ask if we were trying to get him into a psych facility and was advised that we have made contact with [facility] as per his request

## 2023-07-24 NOTE — ED Notes (Signed)
Attempts made to:  [facility]- Does not accept insurance and needs higher level of care   [facility]-Does not take insurance   [facility]- Will not take needs Higher level of care   [facility] [address]- Does not accept insurance  [facility]- Denied   [facility]-Higher level of care   [facility]- Denied   [facility]- will not accept insurance

## 2023-07-24 NOTE — ED Nurses Note (Signed)
After medication, patient continues to pace. Sitter remains at bedside. Provider informed, no new orders.

## 2023-07-24 NOTE — ED Nurses Note (Signed)
Pt waived hearing per [place] w/ [facility].

## 2023-07-25 LAB — VALPROIC ACID LEVEL: VALPROIC ACID LEVEL: <10 ug/mL — ABNORMAL LOW (ref 50.0–100.0)

## 2023-07-25 NOTE — ED Nurses Note (Addendum)
Officer B. Pannell w/ [facility] Dept. Here to transport pt to [facility]. Hold order given to officer, Belongings given to officer.

## 2023-09-02 ENCOUNTER — Emergency Department
Admission: EM | Admit: 2023-09-02 | Discharge: 2023-09-02 | Discharge status: Against Medical Advice | Payer: MEDICAID | Source: Home / Self Care | Attending: Family | Admitting: Family

## 2023-09-02 ENCOUNTER — Encounter (HOSPITAL_BASED_OUTPATIENT_CLINIC_OR_DEPARTMENT_OTHER): Payer: Self-pay

## 2023-09-02 ENCOUNTER — Emergency Department (HOSPITAL_BASED_OUTPATIENT_CLINIC_OR_DEPARTMENT_OTHER)
Admission: EM | Admit: 2023-09-02 | Discharge: 2023-09-03 | Disposition: A | Discharge status: Home / Self Care | Payer: MEDICAID | Source: Home / Self Care | Attending: Emergency Medicine | Admitting: Emergency Medicine

## 2023-09-02 ENCOUNTER — Other Ambulatory Visit: Payer: Self-pay

## 2023-09-02 DIAGNOSIS — R4589 Other symptoms and signs involving emotional state: Secondary | ICD-10-CM

## 2023-09-02 DIAGNOSIS — F200 Paranoid schizophrenia: Secondary | ICD-10-CM | POA: Insufficient documentation

## 2023-09-02 DIAGNOSIS — R45851 Suicidal ideations: Secondary | ICD-10-CM | POA: Insufficient documentation

## 2023-09-02 DIAGNOSIS — Z5320 Procedure and treatment not carried out because of patient's decision for unspecified reasons: Secondary | ICD-10-CM

## 2023-09-02 DIAGNOSIS — R110 Nausea: Secondary | ICD-10-CM | POA: Insufficient documentation

## 2023-09-02 DIAGNOSIS — N3000 Acute cystitis without hematuria: Secondary | ICD-10-CM | POA: Insufficient documentation

## 2023-09-02 DIAGNOSIS — Z5329 Procedure and treatment not carried out because of patient's decision for other reasons: Secondary | ICD-10-CM | POA: Insufficient documentation

## 2023-09-02 LAB — CBC WITH DIFF
BASOPHIL #: 0.04 10*3/uL (ref 0.00–0.10)
BASOPHIL %: 1 % (ref 0–1)
EOSINOPHIL #: 0.06 10*3/uL (ref 0.00–0.50)
EOSINOPHIL %: 1 % (ref 1–8)
HCT: 40.7 % (ref 36.7–47.1)
HGB: 14.1 g/dL (ref 12.5–16.3)
LYMPHOCYTE #: 2.51 10*3/uL (ref 1.00–3.20)
LYMPHOCYTE %: 35 % (ref 15–43)
MCH: 30.4 pg (ref 23.8–33.4)
MCHC: 34.6 g/dL (ref 32.5–36.3)
MCV: 87.7 fL (ref 73.0–96.2)
MONOCYTE #: 0.46 10*3/uL (ref 0.30–1.10)
MONOCYTE %: 6 % (ref 6–14)
MPV: 7.9 fL (ref 7.4–11.4)
NEUTROPHIL #: 4.16 10*3/uL (ref 1.70–7.60)
NEUTROPHIL %: 58 % (ref 44–74)
PLATELETS: 239 10*3/uL (ref 140–440)
RBC: 4.64 10*6/uL (ref 4.06–5.63)
RDW: 15.7 % (ref 12.1–16.2)
WBC: 7.2 10*3/uL (ref 3.6–10.2)

## 2023-09-02 LAB — COMPREHENSIVE METABOLIC PANEL, NON-FASTING
ALBUMIN/GLOBULIN RATIO: 1.2 (ref 0.8–1.4)
ALBUMIN: 4 g/dL (ref 3.4–5.0)
ALKALINE PHOSPHATASE: 99 U/L (ref 46–116)
ALT (SGPT): 67 U/L (ref <=78)
ANION GAP: 9 mmol/L (ref 4–13)
AST (SGOT): 35 U/L (ref 15–37)
BILIRUBIN TOTAL: 1.1 mg/dL — ABNORMAL HIGH (ref 0.2–1.0)
BUN/CREA RATIO: 12
BUN: 12 mg/dL (ref 7–18)
CALCIUM, CORRECTED: 9.3 mg/dL
CALCIUM: 9.3 mg/dL (ref 8.5–10.1)
CHLORIDE: 105 mmol/L (ref 98–107)
CO2 TOTAL: 29 mmol/L (ref 21–32)
CREATININE: 1 mg/dL (ref 0.70–1.30)
ESTIMATED GFR: 100 mL/min/{1.73_m2} (ref >59)
GLOBULIN: 3.4
GLUCOSE: 124 mg/dL — ABNORMAL HIGH (ref 74–106)
OSMOLALITY, CALCULATED: 286 mosm/kg (ref 270–290)
POTASSIUM: 3.5 mmol/L (ref 3.5–5.1)
PROTEIN TOTAL: 7.4 g/dL (ref 6.4–8.2)
SODIUM: 143 mmol/L (ref 136–145)

## 2023-09-02 LAB — URINALYSIS, MACRO/MICRO
BILIRUBIN: NEGATIVE mg/dL
BLOOD: NEGATIVE mg/dL
GLUCOSE: NEGATIVE mg/dL
KETONES: NEGATIVE mg/dL
LEUKOCYTES: NEGATIVE WBCs/uL
NITRITE: POSITIVE — AB
PH: 6 (ref 4.6–8.0)
PROTEIN: NEGATIVE mg/dL
SPECIFIC GRAVITY: 1.025 (ref 1.003–1.035)
UROBILINOGEN: 0.2 mg/dL (ref 0.2–1.0)

## 2023-09-02 LAB — DRUG SCREEN, NO CONFIRMATION, URINE
AMPHETAMINES URINE: NEGATIVE
BARBITURATES URINE: NEGATIVE
BENZODIAZEPINES URINE: NEGATIVE
CANNABINOIDS URINE: NEGATIVE
COCAINE METABOLITES URINE: NEGATIVE
METHADONE URINE: NEGATIVE
OPIATES URINE: NEGATIVE
PCP URINE: NEGATIVE

## 2023-09-02 LAB — ETHANOL, SERUM/PLASMA: ETHANOL: <3 mg/dL (ref <=3)

## 2023-09-02 LAB — ACETAMINOPHEN LEVEL: ACETAMINOPHEN LEVEL: 0 ug/mL — ABNORMAL LOW (ref 10.0–30.0)

## 2023-09-02 LAB — URINALYSIS, MICROSCOPIC

## 2023-09-02 LAB — SALICYLATE ACID LEVEL: SALICYLATE LEVEL: <3 mg/dL (ref 0–20)

## 2023-09-02 MED ORDER — ZIPRASIDONE 20 MG CAPSULE
40.0000 mg | ORAL_CAPSULE | Freq: Two times a day (BID) | ORAL | Status: DC
Start: 2023-09-03 — End: 2023-09-05
  Administered 2023-09-03: 40 mg via ORAL
  Administered 2023-09-03: 0 mg via ORAL

## 2023-09-02 MED ORDER — ZIPRASIDONE 20 MG/ML (FINAL CONCENTRATION) INTRAMUSCULAR SOLUTION
20.0000 mg | Freq: Once | INTRAMUSCULAR | Status: AC
Start: 2023-09-02 — End: 2023-09-02
  Administered 2023-09-02: 20 mg via INTRAMUSCULAR

## 2023-09-02 MED ORDER — ZIPRASIDONE 20 MG/ML (FINAL CONCENTRATION) INTRAMUSCULAR SOLUTION
INTRAMUSCULAR | Status: AC
Start: 2023-09-02 — End: 2023-09-02
  Filled 2023-09-02: qty 1

## 2023-09-02 NOTE — ED Notes (Signed)
 [facility] called for possible placement

## 2023-09-02 NOTE — ED Nurses Note (Signed)
 Placed the patient blue scrubs, gave the patient a blanket, and placed the patient's belongings in locker #1.

## 2023-09-02 NOTE — ED Provider Notes (Signed)
 [facility], [facility] - Emergency Department  ED Primary Provider Note  History of Present Illness   Chief Complaint   Patient presents with    Nausea     Ems reports pt c/o nausea and dizziness. Pt reports he was due his Invega shot on 03/17 but was unable to get it until this upcoming Tuesday.      Arrival: The patient arrived by Ambulance  Donald Atkins is a 38 y.o. male who had concerns including Nausea. Pt states he felt nauseated and has been out of psych meds for 5 to 6 days. States he is not suicidal he don t to be treated like he is that he has never been here that he wasn't. States he has a reason to live. He is going back to [place] on [place] .   Review of Systems   Constitutional: No fever, chills or weakness   Skin: No rash or diaphoresis  HENT: No headaches, or congestion  Eyes: No vision changes or photophobia   Cardio: No chest pain, palpitations or leg swelling   Respiratory: No cough, wheezing or SOB  GI:  +nausea,  no vomiting or stool changes  GU:  No dysuria, hematuria, or increased frequency  MSK: No muscle aches, joint or back pain  Neuro: No seizures, LOC, numbness, tingling, or focal weakness  Psychiatric: No depression, SI or substance abuse  All other systems reviewed and are negative.    History Reviewed This Encounter: all noted and reviewed.     Physical Exam   ED Triage Vitals [09/02/23 1822]   BP (Non-Invasive) (!) 147/88   Heart Rate 84   Respiratory Rate 16   Temperature 36.2 C (97.2 F)   SpO2 98 %   Weight 84.8 kg (187 lb)   Height 1.854 m (6\' 1" )       Constitutional:  38 y.o. male who appears in no distress. Normal color, no cyanosis.   HENT:   Head: Normocephalic and atraumatic.   Mouth/Throat: Oropharynx is clear and moist.   Eyes: EOMI, PERRL   Neck: Trachea midline. Neck supple.  Cardiovascular: RRR, No murmurs, rubs or gallops. Intact distal pulses.  Pulmonary/Chest: BS equal bilaterally. No respiratory distress. No wheezes, rales or chest tenderness.    Abdominal: Bowel sounds present and normal. Abdomen soft, no tenderness, no rebound and no guarding.  Back: No midline spinal tenderness, no paraspinal tenderness, no CVA tenderness.           Musculoskeletal: No edema, tenderness or deformity.  Skin: warm and dry. No rash, erythema, pallor or cyanosis  Psychiatric: mild paranoia that he is going to be treated for psych  denies si hi.   Neurological: Patient keenly alert and responsive, easily able to raise eyebrows, facial muscles/expressions symmetric, speaking in fluent sentences, moving all extremities equally and fully, normal gait    No orders to display     Medical Decision Making   Diff dx of viral syndrome  ge. Pt refused labs , he was offered a Zofran and refused. Signed out ama. I was later told by his nurse tonya belcher after he left that he said the Zofran was going to poison him and that we had ropes under the bed to tie him down.                Clinical Impression   Nausea (Primary)   Left against medical advice       Disposition: AMA

## 2023-09-02 NOTE — ED Nurses Note (Signed)
 Spoke with Corwin Sergeant, on call representative for [facility] [employer], the company responsible for legal guardianship of the patient. He confirmed that the patient is an active recipient of their services. He gave consent to treat the patient.

## 2023-09-02 NOTE — ED Nurses Note (Signed)
 Pt do desk. States,"I am leaving. I didn't want to come here. I wanted to go to [place]. I am nauseated, but I don't want to stay. I was committed from here before. I just don't want to stay. NP informed. Pt signed out AMA. Walked out of ER.

## 2023-09-02 NOTE — ED Provider Notes (Signed)
 [facility], [facility] - Emergency Department  ED Primary Provider Note  History of Present Illness   Chief Complaint   Patient presents with    Suicidal Thoughts      Patient states that he is having suicidal ideation with a plan to ingest foreign objects.     Arrival: The patient arrived by Ambulance  Donald Atkins is a 38 y.o. male who had concerns including Suicidal Thoughts .  Pt was here 30 min pta and left ama stating he was not suicidal that we  are treating him like he is because he has never been here before that he wasn't.  He returns with police officer moore  and ems. Stating he is suicidal plan to hang himself. States he wants to go . When he was here pta first time he stated he has reasons to live because he was getting  a ticket back to [place]. On Monday . He now states people are out to get him. They are laughing at him and it wont stop. States he has not had psychiatric meds for 5 to 6 days. Hx of schizophrenia. Stating over and over he wants to go to [place]  Review of Systems   Constitutional: No fever, chills or weakness   Skin: No rash or diaphoresis  HENT: No headaches, or congestion  Eyes: No vision changes or photophobia   Cardio: No chest pain, palpitations or leg swelling   Respiratory: No cough, wheezing or SOB  GI:  No nausea, vomiting or stool changes  GU:  No dysuria, hematuria, or increased frequency  MSK: No muscle aches, joint or back pain  Neuro: No seizures, LOC, numbness, tingling, or focal weakness  Psychiatric:+ hallucinations,  depression, SI  denies  substance abuse  All other systems reviewed and are negative.    History Reviewed This Encounter: all noted and reviewed     Physical Exam   ED Triage Vitals   BP (Non-Invasive) 09/02/23 1948 (!) 147/93   Heart Rate 09/02/23 1948 91   Respiratory Rate 09/02/23 1948 17   Temperature 09/02/23 1948 36.2 C (97.2 F)   SpO2 09/02/23 1948 95 %   Weight 09/02/23 2103 84.8 kg (187 lb)   Height 09/02/23 2103 1.854 m  (6\' 1" )       Constitutional:  38 y.o. male who appears in no distress. Normal color, no cyanosis.   HENT:   Head: Normocephalic and atraumatic.   Mouth/Throat: Oropharynx is clear and moist.   Eyes: EOMI, PERRL   Neck: Trachea midline. Neck supple.  Cardiovascular: RRR, No murmurs, rubs or gallops. Intact distal pulses.  Pulmonary/Chest: BS equal bilaterally. No respiratory distress. No wheezes, rales or chest tenderness.   Abdominal: Bowel sounds present and normal. Abdomen soft, no tenderness, no rebound and no guarding.  Back: No midline spinal tenderness, no paraspinal tenderness, no CVA tenderness.           Musculoskeletal: No edema, tenderness or deformity.  Skin: warm and dry. No rash, erythema, pallor or cyanosis  Psychiatric: anxious paranoid and stating we are laughing at him we are going to poison him .   Neurological: Patient keenly alert and responsive, easily able to raise eyebrows, facial muscles/expressions symmetric, speaking in fluent sentences, moving all extremities equally and fully, normal gait    Labs Ordered/Reviewed   COMPREHENSIVE METABOLIC PANEL, NON-FASTING - Abnormal; Notable for the following components:       Result Value    GLUCOSE 124 (*)  BILIRUBIN TOTAL 1.1 (*)     All other components within normal limits    Narrative:     Estimated Glomerular Filtration Rate (eGFR) is calculated using the CKD-EPI (2021) equation, intended for patients 76 years of age and older. If gender is not documented or "unknown", there will be no eGFR calculation.   ACETAMINOPHEN  LEVEL - Abnormal; Notable for the following components:    ACETAMINOPHEN  LEVEL 0.0 (*)     All other components within normal limits   URINALYSIS, MACRO/MICRO - Abnormal; Notable for the following components:    NITRITE Positive (*)     All other components within normal limits   URINALYSIS, MICROSCOPIC - Abnormal; Notable for the following components:    BACTERIA Rare (*)     All other components within normal limits    ETHANOL, SERUM/PLASMA - Normal   DRUG SCREEN, NO CONFIRMATION, URINE - Normal   SALICYLATE ACID LEVEL - Normal   CBC WITH DIFF - Normal   URINE CULTURE,ROUTINE   CBC/DIFF    Narrative:     The following orders were created for panel order CBC/DIFF.  Procedure                               Abnormality         Status                     ---------                               -----------         ------                     CBC WITH DIFF[[id number]]                Normal              Final result                 Please view results for these tests on the individual orders.   URINALYSIS WITH REFLEX MICROSCOPIC AND CULTURE IF POSITIVE    Narrative:     The following orders were created for panel order URINALYSIS WITH REFLEX MICROSCOPIC AND CULTURE IF POSITIVE.  Procedure                               Abnormality         Status                     ---------                               -----------         ------                     URINALYSIS, MACRO/MICRO[[id number]]      Abnormal            Final result               URINALYSIS, MICROSCOPIC[[id number]]      Abnormal            Final result  Please view results for these tests on the individual orders.     No orders to display     Medical Decision Making   Diff dx of major depression schizophrenia. Donald Atkins from [place] here to see pt.    Medically cleared for psych.   2304 care left to dr. Hassie Lint. Pt sleeping.   Risk Factors:    Depression, sadness  Protective Factors:    na  Suicide Inquiry:    Thoughts:   Hang himself  Plans/Behaviors/Past attempts:   None current had one 2 weeks ago and 45 days ago.   Intent:   Patient has means to carry out plan.    Risk Level:     Based on my assessment, the patient is a high risk of suicide     Intervention:     HIGH RISK: Patient has been found to be HIGH RISK for suicide.  Admission generally indicated unless a significant change reduces risk. Suicide precautions initiated including 1:1 observation until  transferred to psychiatric unit/facility or risk is downgraded, removal of potential lethal objects and personal belongings from the room until transfer, and other measures advised by psych.    Documentation  Given the above risk level the following plan of care was developed:    Admission is required due to si.   1:1 observation was required due to si.  Medication management - unchanged.   Behavioral health referral was offered and was accepted by the patient.  Contact with supportive persons in their life was encouraged. Patient identifies none as a person of trust and support.  Patient counseled by provider on Suicide prevention and provided with written information, crisis phone numbers, and resources.  Patient follow up is today with pt refuses [facility] .Aaron Aas  Pt has surrogate from [facility] agency.     Medications Ordered/Administered in the ED   ziprasidone  (GEODON ) capsule (has no administration in time range)   ziprasidone  (GEODON ) IM injection (20 mg IntraMUSCULAR Given 09/02/23 2024)     Clinical Impression   Suicidal behavior (Primary)   Paranoid schizophrenia (CMS HCC)       Disposition: Psych Facility

## 2023-09-02 NOTE — ED Nurses Note (Signed)
 Pt resting on stretcher, respirations even and unlabored. No s/s of distress at this time. Plan of care ongoing.

## 2023-09-02 NOTE — ED Nurses Note (Signed)
 Pt resting on stretcher, respirations even and unlabored. No s/s of distress at this time. Plan of care ongoing. 1-1 at bedside.

## 2023-09-03 MED ORDER — BENZTROPINE 1 MG/ML INJECTION SOLUTION
INTRAMUSCULAR | Status: AC
Start: 2023-09-03 — End: 2023-09-03
  Filled 2023-09-03: qty 2

## 2023-09-03 MED ORDER — CEPHALEXIN 500 MG CAPSULE
500.0000 mg | ORAL_CAPSULE | Freq: Four times a day (QID) | ORAL | 0 refills | Status: AC
Start: 2023-09-03 — End: 2023-09-13

## 2023-09-03 MED ORDER — LIDOCAINE HCL 10 MG/ML (1 %) INJECTION SOLUTION
INTRAMUSCULAR | Status: AC
Start: 2023-09-03 — End: 2023-09-03
  Filled 2023-09-03: qty 20

## 2023-09-03 MED ORDER — ZIPRASIDONE 20 MG CAPSULE
ORAL_CAPSULE | ORAL | Status: AC
Start: 2023-09-03 — End: 2023-09-03
  Filled 2023-09-03: qty 2

## 2023-09-03 MED ORDER — SODIUM CHLORIDE 0.9 % INTRAVENOUS SOLUTION
1000.0000 mg | INTRAVENOUS | Status: DC
Start: 2023-09-03 — End: 2023-09-03

## 2023-09-03 MED ORDER — BENZTROPINE 1 MG/ML INJECTION SOLUTION
2.0000 mg | Freq: Every evening | INTRAMUSCULAR | Status: DC
Start: 2023-09-03 — End: 2023-09-03
  Administered 2023-09-03: 2 mg via INTRAMUSCULAR

## 2023-09-03 MED ORDER — CEFTRIAXONE 1 GRAM SOLUTION FOR INJECTION
INTRAMUSCULAR | Status: AC
Start: 2023-09-03 — End: 2023-09-03
  Filled 2023-09-03: qty 10

## 2023-09-03 MED ORDER — LIDOCAINE HCL 10 MG/ML (1 %) INJECTION SOLUTION
1.0000 g | Freq: Once | INTRAMUSCULAR | Status: AC
Start: 2023-09-03 — End: 2023-09-03
  Administered 2023-09-03: 1 g via INTRAMUSCULAR

## 2023-09-03 NOTE — ED Nurses Note (Signed)
 Pt resting in bed with lights off and door open. Respirations even and unlabored with equal chest rise and fall. No acute s/s of distress noted at this time. Plan of care ongoing at this time.

## 2023-09-03 NOTE — ED Nurses Note (Signed)
 Patient provided with breakfast sandwich and drinks at bedside. Denies any further needs at this time.

## 2023-09-03 NOTE — ED Nurses Note (Signed)
 Gave patient a meal tray and cookies. Patient is sitting in bed eating.

## 2023-09-03 NOTE — ED Nurses Note (Signed)
 Patient and mother given written and verbal d/c instructions. All questions/concerns addressed. Informed to return with any concerns. Verbalizes understanding of d/c instructions. [facility] [place] d/c patient to go on bus to [facility] with safe plan. Patient leaving ambulatory at this time.

## 2023-09-03 NOTE — ED Notes (Signed)
 Patient refused to speak with crisis worker at [facility], states he doesn't want to go there. Referral sent to [facility]. [facility] has no beds. [facility] has no beds but placed patient on wait list.

## 2023-09-03 NOTE — ED Nurses Note (Signed)
 [facility] [place] to release patient, awaiting paperwork. Patient denies any needs at present, sitter remains at bedside. Plan of care ongoing. Patient anxious, cooperative. Denies SI/HI.

## 2023-09-03 NOTE — ED Nurses Note (Signed)
 [facility] [place] here to talk to patient at this time. Patient denying SI thoughts at this time. Patient is easily redirectable but remains anxious and pacing around room. Patient appears frustrated. Provided PO fluids upon request.

## 2023-09-03 NOTE — ED Nurses Note (Signed)
 Patient ambulatory to restroom at this time, sitter remains at side. Patient reports that he is anxious and pacing around room. Continues to voice suicidal ideations. Medications given as per order. Patient denies any needs at present.

## 2023-09-03 NOTE — ED Nurses Note (Signed)
 Pt resting on stretcher, respirations even and unlabored. No s/s of distress at this time. Plan of care ongoing.

## 2023-09-03 NOTE — ED Nurses Note (Signed)
 Patient remains anxious, pacing around the room. Sitter remains at bedside. Patient denies any needs at present. Plan of care ongoing awaiting [place].

## 2023-09-03 NOTE — ED Notes (Signed)
 Spoke with Donald Atkins at [place], he has attempted to contact the guardian multiple times without success

## 2023-09-03 NOTE — ED Notes (Signed)
 Spoke with Donald Atkins with [facility] and she states patients rights were restored 01/18/2023 and he is his own guardian now. She will send copies of the legal document for patients chart.

## 2023-09-03 NOTE — ED Nurses Note (Signed)
 Multiple attempts to reach guardian remain unsuccessful by both ER staff and [facility] staff. Have also attempted to call sister listed on file - number is out of service, and mother - no answer and no voicemail setup.

## 2023-09-03 NOTE — ED Notes (Signed)
 Spoke with Deeann Fare at [facility], he spoke with Peterson Brandt, guardian of patient, about possible placement. Eual Hermes stated that patient had requested a petition to gain his rights back but he was unsure if that had went through. He stated he was going to check into it and call back.

## 2023-09-03 NOTE — ED Notes (Signed)
 Attempted to call Donald Atkins, no answer at this time.  Spoke with Donald Atkins at [facility] and he stated he had also attempted to reach him, no answer but he left a message.

## 2023-09-03 NOTE — ED Nurses Note (Signed)
 Patient resting quietly in bed with eyes closed. Respirations even and unlabored. Plan of care ongoing.

## 2023-09-03 NOTE — ED Notes (Signed)
 [facility] states patient needs higher level of care

## 2023-09-03 NOTE — ED Attending Note (Signed)
 [facility], [facility] - Emergency Department  Emergency Department  Course Note    Care/report received from  [facility] at   11PM  Per report:  Donald Atkins is a 38 y.o. male who had concerns including Suicidal Thoughts .  PATIENT WITH HISTORY PARANOID SCHIZOPHRENIA BIPOLAR DISORDER WITH SUICIDAL IDEATION OUT OF HIS MEDICATION FEW WEEKS    Pending labs/imaging/consults:  LABS REVIEWED  Plan:  PATIENT WILL BE MEDICALLY CLEARED FOR INPATIENT PSYCHIATRY EVALUATION  Course:  MONITORED OVERNIGHT PATIENT AWAITING FURTHER EVALUATION BY THE PAVIL[facility]     Based on the history, physical exam, and tests obtained as indicated, the Emergency Department evaluation has identified no acute or active medical issues that warrant further emergency department intervention or precludes discharge of the patient in the company of law enforcement or transfer to a psychiatric facility.    Disposition: Psych Facility          Clinical Impression   Suicidal behavior (Primary)   Paranoid schizophrenia (CMS HCC)         Ferris Hua, MD

## 2023-09-03 NOTE — ED Nurses Note (Signed)
 Attempting to speak with patient about committal paperwork, sitter remains at bedside. Patient becoming increasingly frustrated and anxious. Patient reports, "I just do not want to go to the [facility] but I do not want to be here for a while. I want my ticket to [place] ". Patient states, "Okay, I am leaving then". Then proceeds to briskly walk out of the ER. Security, nurse, and sitter follow patient and patient returns back to room. [place] police notified. Patient calmed down and sitting on bed at this time.

## 2023-09-03 NOTE — ED Attending Handoff Note (Signed)
 [facility], [facility] - Emergency Department  Emergency Department  Course Note    Care/report received from Dr. Augustine Blocker, Aleck Amis, MD at  08:01.  Per report:  Donald Atkins is a 38 y.o. male who had concerns including Suicidal Thoughts .  Patient has a history of paranoid schizophrenia with bipolar disorder and suicidal ideations.  He states he is out of his medication for several weeks.  He states having a plan to hang himself.  He states also that people are out to get him and trying to kill.  Now the patient stating that wants to go to [place].    Pending labs/imaging/consults:  Patient does have a urinalysis that is positive for nitrites.  Drug screen was negative along with CBC and a CMP  Plan:  Plan is to trying get old the [place] and his surrogate.  The [place] set once they get a hold of the surrogate they will trying get him placed at some psychiatric institution.    Course:  Patient was acting out when he 1st came in last night and had to be given Geodon  Ativan .  Presently sleeping without saying anything.  Patient will receive injection Rocephin  for his urinary tract infection.     Based on the history, physical exam, and tests obtained as indicated, the Emergency Department evaluation has identified no acute or active medical issues that warrant further emergency department intervention or precludes discharge of the patient in the company of law enforcement or transfer to a psychiatric facility.  [facility] [place] [facility] came in and saw the patient.  He then did tele psych with judge who then reversed the papers sent us  dismissal forearm for the patient to go home.    Disposition: Discharged          Clinical Impression   Suicidal behavior (Primary)   Paranoid schizophrenia (CMS HCC)   Acute cystitis without hematuria         Porter Britain, MD

## 2023-09-04 ENCOUNTER — Ambulatory Visit (HOSPITAL_BASED_OUTPATIENT_CLINIC_OR_DEPARTMENT_OTHER): Payer: Self-pay | Admitting: NURSE PRACTITIONER

## 2023-09-05 LAB — URINE CULTURE,ROUTINE: URINE CULTURE: 500 — AB

## 2023-09-06 ENCOUNTER — Ambulatory Visit (RURAL_HEALTH_CENTER): Payer: Self-pay | Admitting: Family

## 2023-12-06 ENCOUNTER — Other Ambulatory Visit: Payer: Self-pay

## 2023-12-06 ENCOUNTER — Other Ambulatory Visit (HOSPITAL_PSYCHIATRIC): Payer: Self-pay | Admitting: PHYSICIAN ASSISTANT

## 2023-12-06 ENCOUNTER — Ambulatory Visit: Payer: MEDICAID

## 2023-12-06 ENCOUNTER — Encounter (HOSPITAL_PSYCHIATRIC): Payer: Self-pay

## 2023-12-06 VITALS — BP 136/79 | HR 68 | Resp 18 | Ht 73.0 in | Wt 187.0 lb

## 2023-12-06 DIAGNOSIS — T1491XA Suicide attempt, initial encounter: Secondary | ICD-10-CM | POA: Insufficient documentation

## 2023-12-06 DIAGNOSIS — F259 Schizoaffective disorder, unspecified: Secondary | ICD-10-CM | POA: Insufficient documentation

## 2023-12-06 DIAGNOSIS — Z818 Family history of other mental and behavioral disorders: Secondary | ICD-10-CM

## 2023-12-06 DIAGNOSIS — F411 Generalized anxiety disorder: Secondary | ICD-10-CM | POA: Insufficient documentation

## 2023-12-06 DIAGNOSIS — Z814 Family history of other substance abuse and dependence: Secondary | ICD-10-CM

## 2023-12-06 DIAGNOSIS — F319 Bipolar disorder, unspecified: Secondary | ICD-10-CM | POA: Insufficient documentation

## 2023-12-06 DIAGNOSIS — F209 Schizophrenia, unspecified: Secondary | ICD-10-CM | POA: Insufficient documentation

## 2023-12-06 MED ORDER — QUETIAPINE 100 MG TABLET
100.0000 mg | ORAL_TABLET | Freq: Two times a day (BID) | ORAL | 0 refills | Status: DC
Start: 2023-12-06 — End: 2023-12-29

## 2023-12-06 MED ORDER — HALOPERIDOL 10 MG TABLET
10.0000 mg | ORAL_TABLET | Freq: Two times a day (BID) | ORAL | 0 refills | Status: DC
Start: 2023-12-06 — End: 2023-12-29

## 2023-12-06 MED ORDER — BENZTROPINE 1 MG TABLET
2.0000 mg | ORAL_TABLET | Freq: Two times a day (BID) | ORAL | 0 refills | Status: DC
Start: 2023-12-06 — End: 2023-12-29

## 2023-12-06 MED ORDER — HYDROXYZINE PAMOATE 25 MG CAPSULE
25.0000 mg | ORAL_CAPSULE | Freq: Four times a day (QID) | ORAL | 0 refills | Status: DC | PRN
Start: 2023-12-06 — End: 2024-01-03

## 2023-12-06 NOTE — Telephone Encounter (Signed)
 Pt seen By [name], APRN, PMHNP-BC in office today and has a follow up appt.on 01/03/24. Prescriptions have been built from office note. Please review and approve. Thank you,

## 2023-12-06 NOTE — Progress Notes (Signed)
 BEHAVIORAL MEDICINE, [facility]  [address]  [place] [address]  Operated by [facility]    Psych Evaluation-Outpatient    Name: Donald Atkins MRN:  [id number]   Date: 12/06/2023 DOB:  08-Sep-1985 (38 y.o.)     Chief Complaint:   Chief Complaint   Patient presents with    Schizophrenia    Generalized Anxiety    Bipolar Disorder     HPI:  Patient reports that he wants to try to get into counseling, 1:1 and group therapy. Able to concentrate on tasks. Considers self a Product/process development scientist. Rates anxiety a 2 on a 0-10 scale. Has occasional panic attacks. Rates depression a 0 on a 0-10 scale. Stays awake sometimes 4 days straight, which causes issues with my mental health. Pt reports that he worries that he is not going to wake up if he goes to sleep. Good appetite. Has racing thoughts. Reports periods of really high highs and really low lows. Highs last 4-5 days. Reports that mother will call EMS to TDO patient. Patient reports flashbacks of past traumatic events. Reports having a lot of nightmares, not trauma related. Denies intrusive memories. Has auditory hallucinations. Command hallucinations.Voices tells him to hurt himself. Not currently having active hallucinations. Occasional visual hallucinations, very rare. Patient reports that meds are working good right now. Denies SI/HI.  Past psych history: Has been diagnosed with PTSD, Schizophrenia, ADHD, Bipolar  Past medical history: Seizure, arthritis  Family Psychiatric History: Paternal grandfather died of Alzheimer's. Mother has PTSD, bipolar.  Family Medical History:  Mother with DM, Father has liver problems. Maternal grandfather died of cancer (unknown what type). Paternal grandfather died of Alzheimer's. Maternal grandmother died of alcoholism  Social History:    Born- [place], [name]  Lives- [place], [place]  Education- GED  Employment- On disability  Marital status- Single  Children- None  Tobacco  use- Denies  Alcohol use- Denies  Illicit drug use- Denies  Hospitalizations- A lot. Most recent hospitalization was several months ago.   Latex, Penicillins, and Latex   Current Outpatient Medications   Medication Sig    benztropine  (COGENTIN ) 1 mg Oral Tablet Take 2 Tablets (2 mg total) by mouth Twice daily    haloperidoL  (HALDOL ) 10 mg Oral Tablet Take 1 Tablet (10 mg total) by mouth Twice daily    hydrOXYzine  pamoate (VISTARIL ) 25 mg Oral Capsule Take 1 Capsule (25 mg total) by mouth Four times a day as needed for Itching    omeprazole (PRILOSEC) 20 mg Oral Capsule, Delayed Release(E.C.) Take 1 Capsule (20 mg total) by mouth Once a day    QUEtiapine  (SEROQUEL ) 100 mg Oral Tablet Take 1 Tablet (100 mg total) by mouth Twice daily        Physical Exam/ ROS:    BP 136/79 (Site: Left Arm, Patient Position: Sitting)   Pulse 68   Resp 18   Ht 1.854 m (6' 1)   Wt 84.8 kg (187 lb)   BMI 24.67 kg/m       PHQ-9:   PHQ Total Score                      No data to display                PDMP reviewed.  MSE: The patient is alert and oriented x4, casually dressed, fair eye contact, disheveled a bit, appearing stated age.  Speech is normal rate and tone.  Patient is somewhat  talkative and appears depressed. There is no flight of ideas, loosening of associations, or tangential speech.  Not manic.  Mood is sad.  Affect congruent.  Patient does not appear to be in any acute physical distress.  No overt suicidal ideations, no homicidal ideation.  No auditory or visual hallucinations, no delusions, does experience some paranoia.  No signs of psychosis.  no plans to harm self, no plans to harm others.  Patient is not aggressive or threatening.  No psychomotor agitation.  No psychomotor retardation.  No abnormal involuntary movements.Thoughts are linear, logical, and goal directed.  Intellectual functioning is good.  Memory is intact to recent, remote, and past events.  Patient can recall 3 of 3 objects at 0 and 5 minutes, and  what was eaten for last meal.  Patient is able to provide details of current situation.  Patient can name the president, vice president, and governor.  Language is good.  Vocabulary is unimpaired, no word finding difficulty or word misuse.  Intelligence is good, patient can interpret a proverb, and reports apple and orange similarity. Calculation is unimpaired.  Concentration is good, able to recite days of week forward and backward.  Insight is good; patient is aware of their illness, how it affects their functioning, and what needs to happen for future improvement.  Judgment is good; patient is compliant with treatment and can relate appropriately to what they would do if smelling smoke in a theater, or finding stamped addressed envelope.    Diagnoses:   Generalized Anxiety Disorder, Schizoaffective disorder  Plan:   Hydroxyzine  Pamoate 25 mg take 1 cap by mouth four times daily as needed for anxiety  Haloperidol  10 mg take 1 tab by mouth twice daily for Schizoaffective  Benztropine  1 mg take 2 mg by mouth twice daily for side effects of Haloperidol .  Quetiapine  100 mg by mouth twice daily for Schizoaffective  Follow up:  Follow up in 4 weeks  Time taken for dictation, review of documents, medication reconciliation, interview the patient, required documentation, and clinical decision-making exceeded 60 minutes on the day of visit.  [name], APRN,PMHNP-BC

## 2023-12-12 ENCOUNTER — Emergency Department
Admission: EM | Admit: 2023-12-12 | Discharge: 2023-12-12 | Disposition: A | Discharge status: Home / Self Care | Payer: MEDICAID | Attending: Family Medicine | Admitting: Family Medicine

## 2023-12-12 ENCOUNTER — Encounter (HOSPITAL_COMMUNITY): Payer: Self-pay | Admitting: Family Medicine

## 2023-12-12 ENCOUNTER — Other Ambulatory Visit: Payer: Self-pay

## 2023-12-12 DIAGNOSIS — R9431 Abnormal electrocardiogram [ECG] [EKG]: Secondary | ICD-10-CM | POA: Insufficient documentation

## 2023-12-12 DIAGNOSIS — I4519 Other right bundle-branch block: Secondary | ICD-10-CM

## 2023-12-12 DIAGNOSIS — R42 Dizziness and giddiness: Secondary | ICD-10-CM | POA: Insufficient documentation

## 2023-12-12 DIAGNOSIS — I4510 Unspecified right bundle-branch block: Secondary | ICD-10-CM | POA: Insufficient documentation

## 2023-12-12 DIAGNOSIS — X30XXXA Exposure to excessive natural heat, initial encounter: Secondary | ICD-10-CM

## 2023-12-12 LAB — ECG 12 LEAD
Atrial Rate: 70 {beats}/min
Calculated P Axis: 24 degrees
Calculated R Axis: 100 degrees
Calculated T Axis: 2 degrees
PR Interval: 140 ms
QRS Duration: 92 ms
QT Interval: 368 ms
QTC Calculation: 397 ms
Ventricular rate: 70 {beats}/min

## 2023-12-12 LAB — POC BLOOD GLUCOSE (RESULTS): GLUCOSE, POC: 91 mg/dL (ref 70–100)

## 2023-12-12 NOTE — ED Nurses Note (Signed)
 Patient to ED 31 from EMS. Patient states he was [facility] [employer] [facility] when he became overheated and states he began to panic. He states that his vision began to go dark but denies any LOC. Patient states all symptoms have resolved at this time. Patient given cup of ice water per DO. Will continue to monitor intake. Call bell in reach.

## 2023-12-12 NOTE — ED Provider Notes (Signed)
 [place] [facility]  ED Primary Provider Note  Patient Name: Donald Atkins  Patient Age: 38 y.o.  Date of Birth: [birth year]    Chief Complaint: Heat Exposure        History of Present Illness       Donald Atkins is a 38 y.o. male who had concerns including Heat Exposure.  PATIENT PRESENTED TO THE EMERGENCY DEPARTMENT VIA EMS AFTER HE BECAME DIZZY.  PATIENT STATES THAT CAME ON VERY QUICKLY AND ALSO RESOLVED VERY QUICKLY.  HE STATES THAT HE DOES HAVE HISTORY OF PANIC ATTACKS AND THOUGHT IT MIGHT BE CAUSED BY THAT OR THAT HE GOT TOO HOT WHEN HE WAS WALKING TO THE BUS.  HOWEVER, HE STATES THAT ALL OF HIS SYMPTOMS HAVE RESOLVED AT TIME OF EXAMINATION.  HE DENIES ANY HEADACHE, CHANGES IN VISION, CHEST PAIN, PALPITATIONS, SHORTNESS OF BREATH, ABDOMINAL PAIN, MOTOR STRENGTH DEFICIT OR LOSS OF SENSATION.  HE HAS BEEN TAKING ALL OF HIS MEDICATIONS AND DENIES ANY FURTHER CONCERNS.        Review of Systems     No other overt Review of Systems are noted to be positive except noted in the HPI.      Historical Data   History Reviewed This Encounter: Medical History  Surgical History  Family History  Social History      Physical Exam   ED Triage Vitals [12/12/23 1220]   BP (Non-Invasive) 130/84   Heart Rate 90   Respiratory Rate 18   Temperature 36.9 C (98.4 F)   SpO2 97 %   Weight 86.2 kg (190 lb)   Height 1.854 m (6' 1)         Nursing notes reviewed for what could be assessed. Past Medical, Surgical, and Social history reviewed for what has been completed.     Constitutional: NAD. Well-Developed. Well Nourished.  AFEBRILE  Head: Normocephalic, atraumatic.  Mouth/Throat: no nasal discharge, posterior pharynx WNL  Eyes: EOM grossly intact, conjunctiva normal.  PERRLA.  NO NYSTAGMUS.  Cardiovascular: Regular Rate and Rhythm, extremities well perfused.  Pulmonary/Chest: No respiratory distress. Lungs are symmetric to auscultation bilaterally.  Abdominal: Soft, non-tender, non-distended. Non  peritoneal, no rebound, no guarding.  MSK: No Lower Extremity Edema.  Skin: Warm, dry, and intact  Neuro: Appropriate, CN II-XII grossly intact.  NO MOTOR OR SENSORY DEFICITS NOTED ON EXAMINATION  Psych: Cooperative   GCS: 15          Procedures      Patient Data     Labs Ordered/Reviewed   POC BLOOD GLUCOSE (RESULTS) - Normal   PERFORM POC WHOLE BLOOD GLUCOSE       No orders to display       Medical Decision Making          Medical Decision Making        Studies Assessed:  LAB WORK AND EKG    EKG:   This EKG interpreted by me shows:    Rate: 70    Interpretation:  NORMAL AXIS, SINUS RHYTHM, RATE 70, NONSPECIFIC ST-T WAVE CHANGES      MDM Narrative:  PATIENT PRESENTED TO THE EMERGENCY DEPARTMENT VIA EMS AFTER HE BECAME DIZZY.  PATIENT STATES THAT CAME ON VERY QUICKLY AND ALSO RESOLVED VERY QUICKLY.  HE STATES THAT HE DOES HAVE HISTORY OF PANIC ATTACKS AND THOUGHT IT MIGHT BE CAUSED BY THAT OR THAT HE GOT TOO HOT WHEN HE WAS WALKING TO THE BUS.  HOWEVER, HE STATES THAT ALL OF HIS SYMPTOMS  HAVE RESOLVED AT TIME OF EXAMINATION.  HE DENIES ANY HEADACHE, CHANGES IN VISION, CHEST PAIN, PALPITATIONS, SHORTNESS OF BREATH, ABDOMINAL PAIN, MOTOR STRENGTH DEFICIT OR LOSS OF SENSATION.  HE HAS BEEN TAKING ALL OF HIS MEDICATIONS AND DENIES ANY FURTHER CONCERNS.  PHYSICAL EXAMINATION WAS UNREMARKABLE.  HEART RATE AND RHYTHM WITHIN NORMAL LIMITS.  LUNGS WERE CLEAR TO AUSCULTATION BILATERALLY.  OXYGEN SATURATION WAS 97% ON ROOM AIR.  HE WAS AFEBRILE AND IN NO APPARENT DISTRESS.  ABDOMEN IS SOFT AND NONDISTENDED WITH NO TENDERNESS TO PALPATION.  NO MOTOR OR SENSORY DEFICITS NOTED ON EXAMINATION.  PATIENT WAS OFFERED IV FLUIDS, BUT STATES THAT HE WOULD PREFER TO DRINK FLUIDS AT THIS TIME.  ORAL REHYDRATION THERAPY WAS ORDERED.  ACCU-CHEK AND EKG ORDERED.  PATIENT WAS STABLE.      ED Course as of 12/12/23 1407   Tue Dec 12, 2023   1303 POINT OF CARE BLOOD GLUCOSE 91   1314 ON REEXAMINATION, PATIENT WAS RESTING COMFORTABLY AND IN NO  APPARENT DISTRESS.  PATIENT DENIES ANY NEW OR WORSENING SYMPTOMS.  HE STATES THAT ALL OF HIS SYMPTOMS HAVE RESOLVED AND THAT HE IS FEELING BETTER AFTER FLUID HYDRATION.  PATIENT STATES THAT HE IS BACK TO NORMAL AND IS READY TO GO HOME.  HE WAS COUNSELED AND EDUCATED ON SUPPORTIVE CARE AT HOME, INCLUDING FLUID HYDRATION.  HE WAS INSTRUCTED TO FOLLOW UP WITH PCP IN THE NEXT 1-2 DAYS TO RECHECK SYMPTOMS AND WAS GIVEN VERY CLEAR INSTRUCTIONS TO RETURN TO THE EMERGENCY DEPARTMENT FOR ANY NEW OR WORSENING SYMPTOMS.  PATIENT VERBALIZED UNDERSTANDING.  PATIENT WAS SMILING AND STABLE AT TIME OF DISCHARGE.  ALL QUESTIONS WERE ANSWERED TO SATISFACTION.              Following the history, physical exam, and ED workup, the patient was deemed stable and suitable for discharge. The patient/caregiver was advised to return to the ED for any new or worsening symptoms. Discharge medications, and follow-up instructions were discussed with the patient/caregiver in detail, who verbalizes understanding. The patient/caregiver is in agreement and is comfortable with the plan of care.    Disposition: Discharged         Current Discharge Medication List        CONTINUE these medications - NO CHANGES were made during your visit.        Details   albuterol sulfate 90 mcg/actuation oral inhaler  Commonly known as: PROVENTIL or VENTOLIN or PROAIR   1-2 Puffs, EVERY 6 HOURS PRN  Refills: 0     benztropine  1 mg Tablet  Commonly known as: COGENTIN    2 mg, Oral, 2 TIMES DAILY  Qty: 120 Tablet  Refills: 0     divalproex  500 mg Tablet, Delayed Release (E.C.)  Commonly known as: DEPAKOTE    500 mg, 2 TIMES DAILY  Refills: 0     haloperidoL  10 mg Tablet  Commonly known as: HALDOL    10 mg, Oral, 2 TIMES DAILY  Qty: 60 Tablet  Refills: 0     hydrOXYzine  pamoate 25 mg Capsule  Commonly known as: VISTARIL    25 mg, Oral, 4 TIMES DAILY PRN  Qty: 120 Capsule  Refills: 0     Ibuprofen 800 mg Tablet  Commonly known as: MOTRIN   800 mg, 3 TIMES DAILY PRN  Refills:  0     meloxicam 15 mg Tablet  Commonly known as: MOBIC   15 mg, Daily  Refills: 0     montelukast 10 mg Tablet  Commonly known as:  SINGULAIR   10 mg, NIGHTLY  Refills: 0     omeprazole 20 mg Capsule, Delayed Release(E.C.)  Commonly known as: PRILOSEC   20 mg, DAILY  Refills: 0     pantoprazole  40 mg Tablet, Delayed Release (E.C.)  Commonly known as: PROTONIX    Daily  Refills: 0     QUEtiapine  100 mg Tablet  Commonly known as: SEROquel    100 mg, Oral, 2 TIMES DAILY  Qty: 60 Tablet  Refills: 0            Follow up:   Clinic, [facility]  [address]  [place] [address]  [phone]    In 1 day      [facility] - Emergency Department  [address]  [place]  [address]  [phone]    As needed, If symptoms worsen               Clinical Impression   Dizziness - RESOLVED (Primary)         Discharge Medication List as of 12/12/2023  1:04 PM            Megan Cork, DO

## 2023-12-12 NOTE — ED Triage Notes (Signed)
 Called EMS for getting hot and starting to sweat while [facility] [place] [facility].     PRS: 92

## 2023-12-23 ENCOUNTER — Inpatient Hospital Stay
Admission: RE | Admit: 2023-12-23 | Discharge: 2023-12-29 | DRG: 761 | Disposition: A | Discharge status: Home / Self Care | Payer: MEDICAID | Source: Ambulatory Visit | Attending: Psychiatry | Admitting: Psychiatry

## 2023-12-23 ENCOUNTER — Other Ambulatory Visit: Payer: Self-pay

## 2023-12-23 ENCOUNTER — Encounter (HOSPITAL_PSYCHIATRIC): Payer: Self-pay | Admitting: Psychiatry

## 2023-12-23 ENCOUNTER — Encounter (HOSPITAL_COMMUNITY): Payer: Self-pay

## 2023-12-23 DIAGNOSIS — F29 Unspecified psychosis not due to a substance or known physiological condition: Principal | ICD-10-CM | POA: Diagnosis present

## 2023-12-23 DIAGNOSIS — Z5901 Sheltered homelessness: Secondary | ICD-10-CM

## 2023-12-23 DIAGNOSIS — F410 Panic disorder [episodic paroxysmal anxiety] without agoraphobia: Secondary | ICD-10-CM | POA: Diagnosis present

## 2023-12-23 DIAGNOSIS — J45909 Unspecified asthma, uncomplicated: Secondary | ICD-10-CM | POA: Diagnosis present

## 2023-12-23 DIAGNOSIS — F4310 Post-traumatic stress disorder, unspecified: Secondary | ICD-10-CM | POA: Diagnosis present

## 2023-12-23 DIAGNOSIS — G40909 Epilepsy, unspecified, not intractable, without status epilepticus: Secondary | ICD-10-CM | POA: Diagnosis present

## 2023-12-23 DIAGNOSIS — F250 Schizoaffective disorder, bipolar type: Principal | ICD-10-CM | POA: Diagnosis present

## 2023-12-23 DIAGNOSIS — Z79899 Other long term (current) drug therapy: Secondary | ICD-10-CM

## 2023-12-23 DIAGNOSIS — Z91148 Patient's other noncompliance with medication regimen for other reason: Secondary | ICD-10-CM

## 2023-12-23 MED ORDER — AEROCHAMBER W/ FLOWSIGNAL SPACER
Freq: Once | Status: AC
Start: 2023-12-23 — End: 2023-12-23

## 2023-12-23 MED ORDER — TRAZODONE 50 MG TABLET
50.0000 mg | ORAL_TABLET | Freq: Every evening | ORAL | Status: DC | PRN
Start: 2023-12-23 — End: 2023-12-23

## 2023-12-23 MED ORDER — HYDROXYZINE PAMOATE 25 MG CAPSULE
25.0000 mg | ORAL_CAPSULE | Freq: Three times a day (TID) | ORAL | Status: DC | PRN
Start: 2023-12-23 — End: 2023-12-29
  Administered 2023-12-24: 25 mg via ORAL
  Filled 2023-12-23 (×2): qty 1

## 2023-12-23 MED ORDER — ALUMINUM-MAG HYDROXIDE-SIMETHICONE 200 MG-200 MG-20 MG/5 ML ORAL SUSP
30.0000 mL | ORAL | Status: DC | PRN
Start: 2023-12-23 — End: 2023-12-29

## 2023-12-23 MED ORDER — HALOPERIDOL 5 MG TABLET
5.0000 mg | ORAL_TABLET | Freq: Three times a day (TID) | ORAL | Status: DC
Start: 2023-12-23 — End: 2023-12-29
  Administered 2023-12-23: 5 mg via ORAL
  Administered 2023-12-23: 0 mg via ORAL
  Administered 2023-12-24 – 2023-12-29 (×16): 5 mg via ORAL
  Filled 2023-12-23 (×18): qty 1

## 2023-12-23 MED ORDER — ACETAMINOPHEN 325 MG TABLET
650.0000 mg | ORAL_TABLET | ORAL | Status: DC | PRN
Start: 2023-12-23 — End: 2023-12-29
  Administered 2023-12-26 – 2023-12-28 (×3): 650 mg via ORAL
  Filled 2023-12-23 (×3): qty 2

## 2023-12-23 MED ORDER — MAGNESIUM HYDROXIDE 400 MG/5 ML ORAL SUSPENSION
30.0000 mL | Freq: Every evening | ORAL | Status: DC | PRN
Start: 2023-12-23 — End: 2023-12-29

## 2023-12-23 MED ORDER — DIVALPROEX 500 MG TABLET,DELAYED RELEASE
500.0000 mg | DELAYED_RELEASE_TABLET | Freq: Two times a day (BID) | ORAL | Status: DC
Start: 2023-12-23 — End: 2023-12-29
  Administered 2023-12-23 – 2023-12-29 (×12): 500 mg via ORAL
  Filled 2023-12-23 (×12): qty 1

## 2023-12-23 MED ORDER — ALBUTEROL SULFATE HFA 90 MCG/ACTUATION AEROSOL INHALER - RN
2.0000 | Freq: Four times a day (QID) | RESPIRATORY_TRACT | Status: DC | PRN
Start: 2023-12-23 — End: 2023-12-29
  Administered 2023-12-23 – 2023-12-27 (×2): 2 via RESPIRATORY_TRACT
  Filled 2023-12-23: qty 6.7

## 2023-12-23 MED ORDER — DIVALPROEX 500 MG TABLET,DELAYED RELEASE
500.0000 mg | DELAYED_RELEASE_TABLET | Freq: Two times a day (BID) | ORAL | Status: DC
Start: 2023-12-23 — End: 2023-12-23
  Administered 2023-12-23: 500 mg via ORAL
  Filled 2023-12-23: qty 1

## 2023-12-23 MED ORDER — IBUPROFEN 800 MG TABLET
800.0000 mg | ORAL_TABLET | Freq: Three times a day (TID) | ORAL | Status: DC | PRN
Start: 2023-12-23 — End: 2023-12-29

## 2023-12-23 NOTE — Nurses Notes (Addendum)
 Pt admitted to PICU. Pt dressed in PICU attire. Pt was provided with snack. Pt only ate 10% of provided snack. Pt is frustrated during interview as he wants to go to [facility] if he has to be hospitalized. Emotional support provided. Staff will continue to monitor pt Q 15 min via safety rounds.

## 2023-12-23 NOTE — Care Plan (Signed)
 Plan of Care initiated. Staff will continue to monitor Plan of Care and update based upon assessment and pt's goals.   Problem: Adult Behavioral Health Plan of Care  Goal: Plan of Care Review  Outcome: Ongoing (see interventions/notes)  Goal: Patient-Specific Goal (Individualization)  Outcome: Ongoing (see interventions/notes)  Goal: Strengths and Vulnerabilities  Outcome: Ongoing (see interventions/notes)  Goal: Adheres to Safety Considerations for Self and Others  Outcome: Ongoing (see interventions/notes)  Goal: Absence of New-Onset Illness or Injury  Outcome: Ongoing (see interventions/notes)  Goal: Optimized Coping Skills in Response to Life Stressors  Outcome: Ongoing (see interventions/notes)  Goal: Develops/Participates in Therapeutic Alliance to Support Successful Transition  Outcome: Ongoing (see interventions/notes)  Goal: Rounds/Family Conference  Outcome: Ongoing (see interventions/notes)     Problem: Anxiety Signs/Symptoms  Goal: Optimized Energy Level (Anxiety Signs/Symptoms)  Outcome: Ongoing (see interventions/notes)  Goal: Optimized Cognitive Function (Anxiety Signs/Symptoms)  Outcome: Ongoing (see interventions/notes)  Goal: Improved Mood Symptoms (Anxiety Signs/Symptoms)  Outcome: Ongoing (see interventions/notes)  Goal: Improved Sleep (Anxiety Signs/Symptoms)  Outcome: Ongoing (see interventions/notes)  Goal: Enhanced Social, Occupational or Functional Skills (Anxiety Signs/Symptoms)  Outcome: Ongoing (see interventions/notes)  Goal: Improved Somatic Symptoms (Anxiety Signs/Symptoms)  Outcome: Ongoing (see interventions/notes)     Problem: Depressive Signs/Symptoms  Goal: Optimized Energy Level (Depressive Signs/Symptoms)  Outcome: Ongoing (see interventions/notes)  Goal: Optimized Cognitive Function (Depressive Signs/Symptoms)  Outcome: Ongoing (see interventions/notes)  Goal: Increased Participation and Engagement (Depressive Signs/Symptoms)  Outcome: Ongoing (see interventions/notes)  Goal:  Enhanced Self-Esteem and Confidence (Depressive Signs/Symptoms)  Outcome: Ongoing (see interventions/notes)  Goal: Improved Mood Symptoms (Depressive Signs/Symptoms)  Outcome: Ongoing (see interventions/notes)  Goal: Optimized Nutrition Intake (Depressive Signs/Symptoms)  Outcome: Ongoing (see interventions/notes)  Goal: Improved Psychomotor Symptoms (Depressive Signs/Symptoms)  Outcome: Ongoing (see interventions/notes)  Goal: Improved Sleep (Depressive Signs/Symptoms)  Outcome: Ongoing (see interventions/notes)  Goal: Enhanced Social, Occupational or Functional Skills (Depressive Signs/Symptoms)  Outcome: Ongoing (see interventions/notes)     Problem: Fall Injury Risk  Goal: Absence of Fall and Fall-Related Injury  Outcome: Ongoing (see interventions/notes)     Problem: Pain Acute  Goal: Optimal Pain Control and Function  Outcome: Ongoing (see interventions/notes)     Problem: Psychotic Signs/Symptoms  Goal: Improved Behavioral Control (Psychotic Signs/Symptoms)  Outcome: Ongoing (see interventions/notes)  Goal: Optimal Cognitive Function (Psychotic Signs/Symptoms)  Outcome: Ongoing (see interventions/notes)  Goal: Increased Participation and Engagement (Psychotic Signs/Symptoms)  Outcome: Ongoing (see interventions/notes)  Goal: Improved Mood Symptoms  Outcome: Ongoing (see interventions/notes)  Goal: Improved Psychomotor Symptoms (Psychotic Signs/Symptoms)  Outcome: Ongoing (see interventions/notes)  Goal: Decreased Sensory Symptoms (Psychotic Signs/Symptoms)  Outcome: Ongoing (see interventions/notes)  Goal: Improved Sleep (Psychotic Signs/Symptoms)  Outcome: Ongoing (see interventions/notes)  Goal: Enhanced Social, Occupational or Functional Skills (Psychotic Signs/Symptoms)  Outcome: Ongoing (see interventions/notes)     Problem: Violence Risk or Actual  Goal: Anger and Impulse Control  Outcome: Ongoing (see interventions/notes)

## 2023-12-23 NOTE — Care Plan (Signed)
 Plan of Care initiated upon admission. Staff will continue to monitor Plan of Care and update said Plan of Care throughout pt's hospitalization based upon pt's goals and assessment.   Problem: Adult Behavioral Health Plan of Care  Goal: Plan of Care Review  12/23/2023 0549 by Sherrilyn FALCON, RN  Outcome: Ongoing (see interventions/notes)  12/23/2023 0525 by Sherrilyn FALCON, RN  Outcome: Ongoing (see interventions/notes)  Goal: Patient-Specific Goal (Individualization)  12/23/2023 0549 by Sherrilyn FALCON, RN  Outcome: Ongoing (see interventions/notes)  12/23/2023 0525 by Sherrilyn FALCON, RN  Outcome: Ongoing (see interventions/notes)  Goal: Strengths and Vulnerabilities  12/23/2023 0549 by Sherrilyn FALCON, RN  Outcome: Ongoing (see interventions/notes)  12/23/2023 0525 by Sherrilyn FALCON, RN  Outcome: Ongoing (see interventions/notes)  Goal: Adheres to Safety Considerations for Self and Others  12/23/2023 0549 by Sherrilyn FALCON, RN  Outcome: Ongoing (see interventions/notes)  12/23/2023 0525 by Sherrilyn FALCON, RN  Outcome: Ongoing (see interventions/notes)  Goal: Absence of New-Onset Illness or Injury  12/23/2023 0549 by Sherrilyn FALCON, RN  Outcome: Ongoing (see interventions/notes)  12/23/2023 0525 by Sherrilyn FALCON, RN  Outcome: Ongoing (see interventions/notes)  Goal: Optimized Coping Skills in Response to Life Stressors  12/23/2023 0549 by Sherrilyn FALCON, RN  Outcome: Ongoing (see interventions/notes)  12/23/2023 0525 by Sherrilyn FALCON, RN  Outcome: Ongoing (see interventions/notes)  Goal: Develops/Participates in Therapeutic Alliance to Support Successful Transition  12/23/2023 0549 by Sherrilyn FALCON, RN  Outcome: Ongoing (see interventions/notes)  12/23/2023 0525 by Sherrilyn FALCON, RN  Outcome: Ongoing (see interventions/notes)  Goal: Rounds/Family Conference  12/23/2023 0549 by Sherrilyn FALCON, RN  Outcome: Ongoing (see interventions/notes)  12/23/2023 0525 by Sherrilyn FALCON, RN  Outcome: Ongoing (see interventions/notes)     Problem: Anxiety Signs/Symptoms  Goal: Optimized Energy Level (Anxiety Signs/Symptoms)  12/23/2023 0549 by Sherrilyn FALCON,  RN  Outcome: Ongoing (see interventions/notes)  12/23/2023 0525 by Sherrilyn FALCON, RN  Outcome: Ongoing (see interventions/notes)  Goal: Optimized Cognitive Function (Anxiety Signs/Symptoms)  12/23/2023 0549 by Sherrilyn FALCON, RN  Outcome: Ongoing (see interventions/notes)  12/23/2023 0525 by Sherrilyn FALCON, RN  Outcome: Ongoing (see interventions/notes)  Goal: Improved Mood Symptoms (Anxiety Signs/Symptoms)  12/23/2023 0549 by Sherrilyn FALCON, RN  Outcome: Ongoing (see interventions/notes)  12/23/2023 0525 by Sherrilyn FALCON, RN  Outcome: Ongoing (see interventions/notes)  Goal: Improved Sleep (Anxiety Signs/Symptoms)  12/23/2023 0549 by Sherrilyn FALCON, RN  Outcome: Ongoing (see interventions/notes)  12/23/2023 0525 by Sherrilyn FALCON, RN  Outcome: Ongoing (see interventions/notes)  Goal: Enhanced Social, Occupational or Functional Skills (Anxiety Signs/Symptoms)  12/23/2023 0549 by Sherrilyn FALCON, RN  Outcome: Ongoing (see interventions/notes)  12/23/2023 0525 by Sherrilyn FALCON, RN  Outcome: Ongoing (see interventions/notes)  Goal: Improved Somatic Symptoms (Anxiety Signs/Symptoms)  12/23/2023 0549 by Sherrilyn FALCON, RN  Outcome: Ongoing (see interventions/notes)  12/23/2023 0525 by Sherrilyn FALCON, RN  Outcome: Ongoing (see interventions/notes)     Problem: Depressive Signs/Symptoms  Goal: Optimized Energy Level (Depressive Signs/Symptoms)  12/23/2023 0549 by Sherrilyn FALCON, RN  Outcome: Ongoing (see interventions/notes)  12/23/2023 0525 by Sherrilyn FALCON, RN  Outcome: Ongoing (see interventions/notes)  Goal: Optimized Cognitive Function (Depressive Signs/Symptoms)  12/23/2023 0549 by Sherrilyn FALCON, RN  Outcome: Ongoing (see interventions/notes)  12/23/2023 0525 by Sherrilyn FALCON, RN  Outcome: Ongoing (see interventions/notes)  Goal: Increased Participation and Engagement (Depressive Signs/Symptoms)  12/23/2023 0549 by Sherrilyn FALCON, RN  Outcome: Ongoing (see interventions/notes)  12/23/2023 0525 by Sherrilyn FALCON, RN  Outcome: Ongoing (see interventions/notes)  Goal: Enhanced Self-Esteem and Confidence (Depressive Signs/Symptoms)  12/23/2023 0549  by Sherrilyn FALCON,  RN  Outcome: Ongoing (see interventions/notes)  12/23/2023 0525 by Sherrilyn FALCON, RN  Outcome: Ongoing (see interventions/notes)  Goal: Improved Mood Symptoms (Depressive Signs/Symptoms)  12/23/2023 0549 by Sherrilyn FALCON, RN  Outcome: Ongoing (see interventions/notes)  12/23/2023 0525 by Sherrilyn FALCON, RN  Outcome: Ongoing (see interventions/notes)  Goal: Optimized Nutrition Intake (Depressive Signs/Symptoms)  12/23/2023 0549 by Sherrilyn FALCON, RN  Outcome: Ongoing (see interventions/notes)  12/23/2023 0525 by Sherrilyn FALCON, RN  Outcome: Ongoing (see interventions/notes)  Goal: Improved Psychomotor Symptoms (Depressive Signs/Symptoms)  12/23/2023 0549 by Sherrilyn FALCON, RN  Outcome: Ongoing (see interventions/notes)  12/23/2023 0525 by Sherrilyn FALCON, RN  Outcome: Ongoing (see interventions/notes)  Goal: Improved Sleep (Depressive Signs/Symptoms)  12/23/2023 0549 by Sherrilyn FALCON, RN  Outcome: Ongoing (see interventions/notes)  12/23/2023 0525 by Sherrilyn FALCON, RN  Outcome: Ongoing (see interventions/notes)  Goal: Enhanced Social, Occupational or Functional Skills (Depressive Signs/Symptoms)  12/23/2023 0549 by Sherrilyn FALCON, RN  Outcome: Ongoing (see interventions/notes)  12/23/2023 0525 by Sherrilyn FALCON, RN  Outcome: Ongoing (see interventions/notes)     Problem: Fall Injury Risk  Goal: Absence of Fall and Fall-Related Injury  12/23/2023 0549 by Sherrilyn FALCON, RN  Outcome: Ongoing (see interventions/notes)  12/23/2023 0525 by Sherrilyn FALCON, RN  Outcome: Ongoing (see interventions/notes)     Problem: Pain Acute  Goal: Optimal Pain Control and Function  12/23/2023 0549 by Sherrilyn FALCON, RN  Outcome: Ongoing (see interventions/notes)  12/23/2023 0525 by Sherrilyn FALCON, RN  Outcome: Ongoing (see interventions/notes)     Problem: Psychotic Signs/Symptoms  Goal: Improved Behavioral Control (Psychotic Signs/Symptoms)  12/23/2023 0549 by Sherrilyn FALCON, RN  Outcome: Ongoing (see interventions/notes)  12/23/2023 0525 by Sherrilyn FALCON, RN  Outcome: Ongoing (see interventions/notes)  Goal: Optimal Cognitive Function (Psychotic  Signs/Symptoms)  12/23/2023 0549 by Sherrilyn FALCON, RN  Outcome: Ongoing (see interventions/notes)  12/23/2023 0525 by Sherrilyn FALCON, RN  Outcome: Ongoing (see interventions/notes)  Goal: Increased Participation and Engagement (Psychotic Signs/Symptoms)  12/23/2023 0549 by Sherrilyn FALCON, RN  Outcome: Ongoing (see interventions/notes)  12/23/2023 0525 by Sherrilyn FALCON, RN  Outcome: Ongoing (see interventions/notes)  Goal: Improved Mood Symptoms  12/23/2023 0549 by Sherrilyn FALCON, RN  Outcome: Ongoing (see interventions/notes)  12/23/2023 0525 by Sherrilyn FALCON, RN  Outcome: Ongoing (see interventions/notes)  Goal: Improved Psychomotor Symptoms (Psychotic Signs/Symptoms)  12/23/2023 0549 by Sherrilyn FALCON, RN  Outcome: Ongoing (see interventions/notes)  12/23/2023 0525 by Sherrilyn FALCON, RN  Outcome: Ongoing (see interventions/notes)  Goal: Decreased Sensory Symptoms (Psychotic Signs/Symptoms)  12/23/2023 0549 by Sherrilyn FALCON, RN  Outcome: Ongoing (see interventions/notes)  12/23/2023 0525 by Sherrilyn FALCON, RN  Outcome: Ongoing (see interventions/notes)  Goal: Improved Sleep (Psychotic Signs/Symptoms)  12/23/2023 0549 by Sherrilyn FALCON, RN  Outcome: Ongoing (see interventions/notes)  12/23/2023 0525 by Sherrilyn FALCON, RN  Outcome: Ongoing (see interventions/notes)  Goal: Enhanced Social, Occupational or Functional Skills (Psychotic Signs/Symptoms)  12/23/2023 0549 by Sherrilyn FALCON, RN  Outcome: Ongoing (see interventions/notes)  12/23/2023 0525 by Sherrilyn FALCON, RN  Outcome: Ongoing (see interventions/notes)     Problem: Violence Risk or Actual  Goal: Anger and Impulse Control  12/23/2023 0549 by Sherrilyn FALCON, RN  Outcome: Ongoing (see interventions/notes)  12/23/2023 0525 by Sherrilyn FALCON, RN  Outcome: Ongoing (see interventions/notes)

## 2023-12-23 NOTE — Group Note (Signed)
 [facility] MEDICINE [facility], INTENSIVE CARE BEHAVIORAL MEDICINE  [address]  [place] [address]    Group Note             Name: Donald Atkins   Date of Birth: 04/07/1986   Today's Date: 12/23/2023   Group Start Time: 1000   Group End Time: 1100   Group Topic: ACTIVITY GROUP  Number of participants: 2      Summary of group discussion:   Leisure education Leisure Inventory :  to determine knowledge related to personal rec/leisure habits.    [name]'s Participation and Response: Pt did not attend.      Suicidal/Homicidal Risk:  Currently denies SI/HI and expresses willingness to contact crisis services if needed.

## 2023-12-23 NOTE — Nurses Notes (Signed)
 7a to 7p NURSE NOTES; PATIENT IS DRESSED IN PICU ATTIRE AND HAS BEEN UP ON UNIT AD LIB. STATES SLEEP AND APPETITE HAS BEEN GOOD. DENIES SI AND HI. ENDORSES A/V HALLUCINATIONS. STATES He HEARS AND SEES THINGS. CANNOT ELABORATE WHAT He IS SEEING AND HEARING. RATES ANXIETY AS 4 AND DEPRESSION AS 8 ON 0 TO 10 SCALE WITH 10 BEING THE HIGHEST.

## 2023-12-23 NOTE — Nurses Notes (Signed)
 Pt. History from the ER and [facility] files stated he has a history of swallowing plastic spoons at facility, razors blades, and shower curtain rings when here in past. Reported to day shift charge nurse and on coming nurse in report.

## 2023-12-23 NOTE — Consults (Signed)
  MEDICINE [facility]  Hospitalist Consultation    Date of Service:  12/23/2023  Calahan Pak   38 y.o. male  Date of Admission:  12/23/2023  Date of Birth:  Oct 29, 1985  0         History of Present Illness:    Patient is a 38 year old Caucasian male who is here for psychiatric stabilization.  Patient has a history of seizures for which he states he takes Depakote  for.  I went ahead and started back his Depakote  although none of his medications has been validated by staff.  And I do not want to see him have a breakthrough seizure.  Also gave him albuterol  as he has a history of asthma.  I can look at his remaining medications tomorrow once list is validated.  Psychiatry can handle his psychiatric meds.  He is quite pleasant and cooperative.  He is resting comfortably.  He has no specific physical complaints.                History:    Past Medical:    Past Medical History:   Diagnosis Date    Bipolar disorder, unspecified     Borderline intellectual functioning     Convulsions     Convulsions     GAD (generalized anxiety disorder)     GERD (gastroesophageal reflux disease)     Mixed hyperlipidemia     Schizoaffective disorder     Schizophrenia       Past Surgical:  History reviewed. No pertinent surgical history.   Family:    Family Medical History:    None        Social:   reports that he has never smoked. He has never been exposed to tobacco smoke. He has never used smokeless tobacco. He reports that he does not drink alcohol and does not use drugs.     REVIEW OF SYSTEMS:  Full ROS performed. ROS negative if not mentioned in HPI.    Allergies[1]    Medications:  Medications Prior to Admission       Prescriptions    albuterol  sulfate (PROVENTIL  OR VENTOLIN  OR PROAIR ) 90 mcg/actuation Inhalation oral inhaler    Take 1-2 Puffs by inhalation Every 6 hours as needed    benztropine  (COGENTIN ) 1 mg Oral Tablet    Take 2 Tablets (2 mg total) by mouth Twice daily Indications: side effects of  Haloperidol .    divalproex  (DEPAKOTE ) 500 mg Oral Tablet, Delayed Release (E.C.)    Take 1 Tablet (500 mg total) by mouth Twice daily    haloperidoL  (HALDOL ) 10 mg Oral Tablet    Take 1 Tablet (10 mg total) by mouth Twice daily Indications: schizoaffective d/o.    hydrOXYzine  pamoate (VISTARIL ) 25 mg Oral Capsule    Take 1 Capsule (25 mg total) by mouth Four times a day as needed for Anxiety    Ibuprofen  (MOTRIN ) 800 mg Oral Tablet    Take 1 Tablet (800 mg total) by mouth Three times a day as needed    meloxicam (MOBIC) 15 mg Oral Tablet    Take 1 Tablet (15 mg total) by mouth Daily    montelukast (SINGULAIR) 10 mg Oral Tablet    Take 1 Tablet (10 mg total) by mouth Every night    omeprazole (PRILOSEC) 20 mg Oral Capsule, Delayed Release(E.C.)    Take 1 Capsule (20 mg total) by mouth Once a day    pantoprazole  (PROTONIX ) 40 mg Oral Tablet, Delayed Release (E.C.)  Take by mouth Daily    QUEtiapine  (SEROQUEL ) 100 mg Oral Tablet    Take 1 Tablet (100 mg total) by mouth Twice daily Indications: schizoaffective.          acetaminophen  (TYLENOL ) tablet, 650 mg, Oral, Q4H PRN  albuterol  90 mcg per inhalation oral inhaler - Nursing to administer, 2 Puff, Inhalation, Q6H PRN   And  aerochamber w/ flowsignal spacer inhalational device, , Inhalation, Once  aluminum -magnesium  hydroxide-simethicone  (MAG-AL PLUS) 200-200-20 mg per 5 mL oral liquid, 30 mL, Oral, Q4H PRN  divalproex  (DEPAKOTE ) 12 hr delayed release tablet, 500 mg, Oral, 2x/day  ibuprofen  (MOTRIN ) tablet, 800 mg, Oral, 3x/day PRN  magnesium  hydroxide (MILK OF MAGNESIA) 400mg  per 5mL oral liquid, 30 mL, Oral, HS PRN  traZODone  (DESYREL ) tablet, 50 mg, Oral, HS PRN - MR x 1        Physical Exam:  Vitals:  Temperature: 36.6 C (97.9 F)  Heart Rate: 77  Respiratory Rate: 18  BP (Non-Invasive): (!) 150/75  SpO2: 97 %    Exam:  Nursing note and vitals reviewed.   General: No acute distress, alert and oriented x3  HEENT: Pharynx without no erythema, exudate. PERRLA,  conjunctivae are without injection or scleral icterus  Neck: Neck supple without lymphadenopathy. Thyroid  non-tender and without nodules.  Cardio: Regular rate and rhythm. Normal S1 and S2. No murmurs, gallops, or rubs. PMI located in the midclavicular line.   Resp: Clear to auscultation bilaterally. No wheezes, rales, rhonchi or crackles. Normal resp effort.  Abd: Bowel sounds present in all 4 quadrants. Negative murphy's sign. No rebound tenderness or involuntary guarding. Liver and spleen not palpable.  GU: Deferred exam  Extremities: No edema or swelling noted. No gait disturbances or weakness. Muscle strength 5/5 in bilateral upper and lower extremities.  Skin: No rashes, ulcers, or bruising.  Neuro: CN II - XII grossly intact  I: smell Not tested   II: visual acuity  Visual acuity appears normal bilaterally   II: visual fields Full to confrontation   II: pupils Equal, round, reactive to light   III,VII: ptosis none   III,IV,VI: extraocular muscles  Full ROM   V: mastication normal   V: facial light touch sensation  normal   V,VII: corneal reflex  present   VII: facial muscle function - upper  normal   VII: facial muscle function - lower normal   VIII: hearing Not tested   IX: soft palate elevation  normal   IX,X: gag reflex present   XI: trapezius strength  5/5   XI: sternocleidomastoid strength 5/5   XI: neck flexion strength  5/5   XII: tongue strength  normal      [place]  [employer] [facility]  [address] [facility]  [address]                        Phone [phone]  [place], [place]  [address]      Fax: [phone]        Labs:     No results found for any visits on 12/23/23 (from the past 24 hours).    Imaging Studies:        ECG 12 LEAD  Normal sinus rhythm  Incomplete right bundle branch block  Possible Right ventricular hypertrophy  Nonspecific T wave abnormality  Abnormal ECG  When compared with ECG of 23-Jun-2023 02:45,  Incomplete right  bundle branch block is now present  Confirmed by Kanjerla, Kiran kumar ([id number]) on 12/12/2023 7:42:01 PM      Assessment/Plan:  History of epilepsy.  I have started back his Depakote .  History of asthma.  I have written for albuterol  PRN I can take a look at his medications once validated tomorrow.  I appreciate this interesting consultation    Problem List:  Active Hospital Problems   (*Primary Problem)    Diagnosis    *Psychosis       DVT/PE Prophylaxis:  Early ambulation      Alm Barge, PA-C  [place] [facility]ICINE HOSPITALIST           [1]   Allergies  Allergen Reactions    Latex     Penicillins Anaphylaxis    Latex

## 2023-12-23 NOTE — H&P (Signed)
 [facility]     New Patient Psychiatric Admission     Patient's Full Name: Donald Atkins   Patient's Date of Birth: 01/14/1986   Patient's Age: 38 y.o.   Patient's Legal Sex: male   Patient's MRN: [id number]   Patient's Date of Admission: 12/23/2023   Current Date: 12/23/2023 09:48     Patient's Room/Bed: PICU05/B     Legal Status: voluntary       Chief Complaint: I don't know.. I am not suicidal.. not homicidal      History of Present Illness:  38 year old male with self reported  PTSD, schizophrenia, bipolar disorder, multiple psychiatric hospital (2 times this year, total of at least 6), homelessness and poor compliance with medications. Admitted for auditory hallucinations.   Patient minimizing symptoms and not forthcoming.   Today he says he is not sure why he is here. He say she was not taking medications for a while. He says EMS brought him in but he is not sure why. He reports hallucinations. He states I was hearing voices.... they come and go. He would not go into details.   He says he ran out of his medications and he was on Depakote  , Cogentin  and Zyprexa . He rates depression and anxiety 0/10.   Case discussed with Venetia Chester, RN   Review of Systems   Constitutional:  Denies pain.   HENT: Negative.     Eyes: Negative.    Respiratory: Negative.     Cardiovascular: Negative.    Gastrointestinal: Negative.    Genitourinary: Negative.    Musculoskeletal:  denies  myalgias.   Skin: Negative.    Neurological: Negative.    Psychiatric/Behavioral:  Positive for depression, anxiety, hallucinations         Psychiatric History:    Past Diagnoses: self reported  PTSD, schizophrenia, bipolar disorder, multiple psychiatric hospital (2 times this year, total of at least 6), homelessness and poor compliance with medications    Treatment History:   History of Inpatient Psychiatric Treatment? Yes   History of Outpatient Psychiatric Treatment? no  History of detox/rehab? no  History  of ECT? no  History of IOP/PHP? no  History Clozaril Treatment? no      Substance Abuse History:    Alcohol Use: no     Drug Use: Denies   Urine drug screen negative     Tobacco Use: no      Medical History:      Past Medical History:      Past Medical History:   Diagnosis Date    Bipolar disorder, unspecified     Borderline intellectual functioning     Convulsions     Convulsions     GAD (generalized anxiety disorder)     GERD (gastroesophageal reflux disease)     Mixed hyperlipidemia     Schizoaffective disorder     Schizophrenia             Surgical History:     History reviewed. No past surgical history pertinent negatives.         ROS:    Constitutional: Denies fever, chills, night sweats, fatigue, malaise   HEENT: Denies change in hearing, no diplopia or blurred vision,   no dysphagia, no earache, sore throat or runny nose  no epistaxis or rhinorrhea, no eye pain, no hearing loss  Respiratory: Denies cough, wheeze, SOB  Cardiovascular: Denies chest pain, no palpitations   Gastrointestinal: Reports average bowel movements and  Denies changes in stool, consistency and/or frequency. Denies nausea, emesis  GU: Denies dysuria, urgency, hematuria   MSK: Denies pain presently   Neuro: Denies weakness, numbness, headache, or seizure   Skin: Denies rash   Endocrine: Denies unexplained weight loss, polydipsia, polyuria   Hematologic: Denies bleeding or bruising   Additional Review of systems information: All other systems reviewed and otherwise negative    Family Psychiatric History:  Denies     Developmental History:    Any issues at birth?  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     Physical Exam:     Please see dictated hospitalist note    Current Facility-Administered Medications   Medication Dose Route Frequency    acetaminophen  (TYLENOL ) tablet  650 mg Oral Q4H PRN    aluminum -magnesium  hydroxide-simethicone  (MAG-AL PLUS) 200-200-20 mg per 5 mL oral liquid  30 mL Oral Q4H PRN    ibuprofen  (MOTRIN ) tablet  800 mg Oral 3x/day PRN    magnesium  hydroxide (MILK OF MAGNESIA) 400mg  per 5mL oral liquid  30 mL Oral HS PRN    traZODone  (DESYREL ) tablet  50 mg Oral HS PRN - MR x 1        Mental Status Examination:  .Sensorium/Alertness: Alert,   Orientation: Date, Person, Place,   Appearance:Appears stated age,   Psychomotor Activity: Normal,   Abnormal Behaviors: None,   Attitude Towards Examiner:guarded   Eye Contact: poor   Speech: Normal/Spontaneous,    Mood: Anxious, Depressed,   Affect: Appropriate,   Perception: auditory hallucinations   Though Process: Logical/Clear/Goal Oriented,  Thought Content: Suicidal? Denies   Thought Content: Homicidal? no  Thought Content: Delusions? no  Impulse Control:  Grossly Intact,   Concentration/Calculation/Attention Span: WNL,  How was the patient's Concentration/Calculation/Attention tested/assessed? Per observation and interview with patient   Recent Memory: WNL,  Remote Memory: WNL,   How was the patient's Remote Memory Tested/Assessed? Past Events, as it relates to history  Intelligence/Fund of Knowledge: Average  How was the patient's Intelligence/Fund of Knowledge Tested/Assessed? Based on history, Based on vocabulary, syntax, grammar, and content  Judgement: Fair,   How was the patient's Judgement  Tested/Assessed? Per patient's behavior/history of present illness  Insight: Fair,   How was the patient's Insight Tested/Assessed? Understanding of severity of illness/history of present illness       Suicide Risk Assessment:    Suicide Risk Factors:  history of prior suicide attempts, yes one time  self injurious behaviors; denies   mood/psychotic disorder, yes   substance use disorder, denies   personality disorder, no  impulsivity, yes   history of physical aggression, denies   anxiety, yes  family history of suicide, no  chronic pain or medical illness, no  history of physical or sexual abuse, denies   humiliation/hopelessness/despair, denies   pending discharge to lower level of treatment, no  access to firearms:no  traumatic brain injury:denies     Suicide Protective Factors: access to care     Suicide Other Protective Factors:    Ideation (frequency, intensity, duration):   Denies     Plan (timing, location, lethality, availability, preparatory acts)  Denies     Behaviors (past attempts, aborted attempts, rehearsals vs. Non suicidal self-injurious behavior)  One suicide attempt with a foreign object   He would not go into details     Intent (extent to which patient expects to carry out plan and believes the plan/act to be lethal vs self injurious)  Denies     Homicide Risk Assessment:    Homicide Risk Factors: Denies previous violence-homicide, assault,arson,property crime; use of weapons; jail/hospital/family violence; conviction for violence.     Homicide Protective Factors: therapeutic relationships, social support, fear of injuring others for legal reasons, belief that violence/homicide is immoral, shows genuine interest in receiving help    Strength and Limitations:    Patient Strengths: able to vocalize needs;     Patient Limitations: medication non-compliance;     Discussed with family/guardian/significant other: no      Clinical Conclusion:    Overall Impression:38 year old male with self reported   PTSD, schizophrenia, bipolar disorder, multiple psychiatric hospital (2 times this year, total of at least 6), homelessness and poor compliance with medications. Admitted for auditory hallucinations.   Patient minimizing symptoms and not forthcoming.   He would benefit from LAI due to poor compliance with medications.       Diagnoses:   F25.0 Schizoaffective disorder, bipolar type  Z59. 0 Homelessness  Poor compliance with medications     Differential Diagnoses:  PTSD        Treatment and Medication Recommendation:  -The patient will be admitted for stabilization.  Medicines will be instituted and adjusted.  We have discussed medicine side-effects, treatment options, and pregnancy precautions if applicable.  The patient will be encouraged to be active in groups and milieu treatment.  Patient voices understanding and agrees with the plan.  Patient aware of unit rules and regulations and will be seen by the hospitalist for medical issues.  Further changes will be dictated by clinical course.   I estimate patient length of stay will be 7-14 days   -behavioral health safety checks   -regular diet, full code  -provisional discharge disposition: homeless shelter     12/23/22  Start Depakote  500 mg PO BID for mood   Start Vistaril  25 mg PO q 8 hrs PRN anxiety   Start haldol  5 mg PO TID for psychosis. Goal is to do Haldol  Decanoate.   DO not restart Cogentin    DO not restart Seroquel           I certify that the inpatient psychiatric admission is medically necessary for (1) treatment which is reasonably expected to improve the patient's condition, and/or (2) for diagnostic studies    Interaction Attestation: Clinical telemedicine services delivered using HIPAA-compliant interactive video-audio telecommunications while the patient and the rendering provider were not in the same physical location. Patient agreeable to  telecommunication.    TELEMEDICINE DOCUMENTATION:    Patient Location:  [facility], [address] [place], [place] [address]  Provider Location: Remote  Patient/family aware of provider location:  yes  Patient/family consent for telemedicine:  yes  Examination observed and performed by:  .Ezella Sanders, PMHNP    ATTENDING ATTESTATION:  The NP evaluated the patient and provided care. I have reviewed the note. I agree with their findings and at this time have no amendments to the current plan.     Prentice ROCKFORD Quin, DO  Psychiatrist  Medical Director  [facility]       [1]   Allergies  Allergen Reactions    Latex     Penicillins Anaphylaxis    Latex

## 2023-12-24 ENCOUNTER — Encounter (HOSPITAL_PSYCHIATRIC): Payer: Self-pay

## 2023-12-24 NOTE — Care Plan (Addendum)
 Pt is calm, cooperative, and med compliant at this time. Pt is withdrawn to his room and resting peacefully. Plan of care is ongoing.  Problem: Adult Behavioral Health Plan of Care  Goal: Plan of Care Review  Outcome: Ongoing (see interventions/notes)  Goal: Patient-Specific Goal (Individualization)  Outcome: Ongoing (see interventions/notes)  Flowsheets (Taken 12/23/2023 2142)  Individualized Care Needs: Controlling fears  Anxieties, Fears or Concerns: Pt states I just want out.  Goal: Adheres to Safety Considerations for Self and Others  Outcome: Ongoing (see interventions/notes)  Intervention: Develop and Maintain Individualized Safety Plan  Recent Flowsheet Documentation  Taken 12/23/2023 2142 by Inge BROCKS, GN  Safety Measures: safety rounds completed  Goal: Absence of New-Onset Illness or Injury  Outcome: Ongoing (see interventions/notes)  Intervention: Identify and Manage Fall Risk  Recent Flowsheet Documentation  Taken 12/23/2023 2142 by Inge BROCKS, GN  Safety Promotion/Fall Prevention:   nonskid shoes/slippers when out of bed   safety round/check completed  Goal: Optimized Coping Skills in Response to Life Stressors  Outcome: Ongoing (see interventions/notes)  Goal: Develops/Participates in Therapeutic Alliance to Support Successful Transition  Outcome: Ongoing (see interventions/notes)  Intervention: [facility]  Recent Flowsheet Documentation  Taken 12/23/2023 2142 by Inge BROCKS, GN  Trust Relationship/Rapport: care explained  Goal: Rounds/Family Conference  Outcome: Ongoing (see interventions/notes)     Problem: Anxiety Signs/Symptoms  Goal: Optimized Energy Level (Anxiety Signs/Symptoms)  Outcome: Ongoing (see interventions/notes)  Intervention: Optimize Energy Level  Recent Flowsheet Documentation  Taken 12/23/2023 2142 by Inge BROCKS, GN  Diversional Activity: lying down  Goal: Optimized Cognitive Function (Anxiety Signs/Symptoms)  Outcome: Ongoing (see interventions/notes)  Goal: Improved  Mood Symptoms (Anxiety Signs/Symptoms)  Outcome: Ongoing (see interventions/notes)  Goal: Improved Sleep (Anxiety Signs/Symptoms)  Outcome: Ongoing (see interventions/notes)  Intervention: Promote Healthy Sleep Hygiene  Recent Flowsheet Documentation  Taken 12/23/2023 2142 by Inge BROCKS, GN  Sleep Hygiene Promotion: awakenings minimized  Goal: Enhanced Social, Occupational or Functional Skills (Anxiety Signs/Symptoms)  Outcome: Ongoing (see interventions/notes)  Intervention: Promote Social, Occupational and Functional Ability  Recent Flowsheet Documentation  Taken 12/23/2023 2142 by Inge BROCKS, GN  Trust Relationship/Rapport: care explained  Goal: Improved Somatic Symptoms (Anxiety Signs/Symptoms)  Outcome: Ongoing (see interventions/notes)     Problem: Depressive Signs/Symptoms  Goal: Optimized Energy Level (Depressive Signs/Symptoms)  Outcome: Ongoing (see interventions/notes)  Intervention: Optimize Energy Level  Recent Flowsheet Documentation  Taken 12/23/2023 2142 by Inge BROCKS, GN  Diversional Activity: lying down  Goal: Optimized Cognitive Function (Depressive Signs/Symptoms)  Outcome: Ongoing (see interventions/notes)  Goal: Increased Participation and Engagement (Depressive Signs/Symptoms)  Outcome: Ongoing (see interventions/notes)  Intervention: Facilitate Participation and Engagement  Recent Flowsheet Documentation  Taken 12/23/2023 2142 by Inge BROCKS, GN  Diversional Activity: lying down  Goal: Enhanced Self-Esteem and Confidence (Depressive Signs/Symptoms)  Outcome: Ongoing (see interventions/notes)  Intervention: Promote Confidence and Self-Esteem  Recent Flowsheet Documentation  Taken 12/23/2023 2142 by Inge BROCKS, GN  Diversional Activity: lying down  Goal: Improved Mood Symptoms (Depressive Signs/Symptoms)  Outcome: Ongoing (see interventions/notes)  Intervention: Promote Mood Improvement  Recent Flowsheet Documentation  Taken 12/23/2023 2142 by Inge BROCKS, GN  Diversional Activity: lying down  Goal:  Optimized Nutrition Intake (Depressive Signs/Symptoms)  Outcome: Ongoing (see interventions/notes)  Goal: Improved Psychomotor Symptoms (Depressive Signs/Symptoms)  Outcome: Ongoing (see interventions/notes)  Intervention: Manage Psychomotor Movement  Recent Flowsheet Documentation  Taken 12/23/2023 2142 by Inge BROCKS, GN  Diversional Activity: lying down  Goal: Improved Sleep (Depressive Signs/Symptoms)  Outcome: Ongoing (see interventions/notes)  Intervention: Promote Healthy  Sleep Hygiene  Recent Flowsheet Documentation  Taken 12/23/2023 2142 by Inge BROCKS, GN  Sleep Hygiene Promotion: awakenings minimized  Goal: Enhanced Social, Occupational or Functional Skills (Depressive Signs/Symptoms)  Outcome: Ongoing (see interventions/notes)     Problem: Fall Injury Risk  Goal: Absence of Fall and Fall-Related Injury  Outcome: Ongoing (see interventions/notes)  Intervention: Promote Scientist, clinical (histocompatibility and immunogenetics) Documentation  Taken 12/23/2023 2142 by Inge BROCKS, GN  Safety Promotion/Fall Prevention:   nonskid shoes/slippers when out of bed   safety round/check completed     Problem: Pain Acute  Goal: Optimal Pain Control and Function  Outcome: Ongoing (see interventions/notes)     Problem: Psychotic Signs/Symptoms  Goal: Improved Behavioral Control (Psychotic Signs/Symptoms)  Outcome: Ongoing (see interventions/notes)  Goal: Optimal Cognitive Function (Psychotic Signs/Symptoms)  Outcome: Ongoing (see interventions/notes)  Intervention: Support and Promote Cognitive Ability  Recent Flowsheet Documentation  Taken 12/23/2023 2142 by Inge BROCKS, GN  Trust Relationship/Rapport: care explained  Goal: Increased Participation and Engagement (Psychotic Signs/Symptoms)  Outcome: Ongoing (see interventions/notes)  Intervention: Facilitate Participation and Engagement  Recent Flowsheet Documentation  Taken 12/23/2023 2142 by Inge BROCKS, GN  Diversional Activity: lying down  Goal: Improved Mood Symptoms  Outcome: Ongoing (see  interventions/notes)  Intervention: Optimize Emotion and Mood  Recent Flowsheet Documentation  Taken 12/23/2023 2142 by Inge BROCKS, GN  Diversional Activity: lying down  Goal: Improved Psychomotor Symptoms (Psychotic Signs/Symptoms)  Outcome: Ongoing (see interventions/notes)  Intervention: Manage Psychomotor Movement  Recent Flowsheet Documentation  Taken 12/23/2023 2142 by Inge BROCKS, GN  Diversional Activity: lying down  Goal: Decreased Sensory Symptoms (Psychotic Signs/Symptoms)  Outcome: Ongoing (see interventions/notes)  Goal: Improved Sleep (Psychotic Signs/Symptoms)  Outcome: Ongoing (see interventions/notes)  Intervention: Promote Healthy Sleep Hygiene  Recent Flowsheet Documentation  Taken 12/23/2023 2142 by Inge BROCKS, GN  Sleep Hygiene Promotion: awakenings minimized  Goal: Enhanced Social, Occupational or Functional Skills (Psychotic Signs/Symptoms)  Outcome: Ongoing (see interventions/notes)  Intervention: Promote Social, Occupational and Functional Ability  Recent Flowsheet Documentation  Taken 12/23/2023 2142 by Inge BROCKS, GN  Trust Relationship/Rapport: care explained     Problem: Violence Risk or Actual  Goal: Anger and Impulse Control  Outcome: Ongoing (see interventions/notes)     Problem: Suicide Risk  Goal: Absence of Self-Harm  Outcome: Ongoing (see interventions/notes)

## 2023-12-24 NOTE — Group Note (Signed)
 Group topic:  COMMUNITY MEETING GROUP    Date of group:  12/24/2023  Start time of group:  1145  End time of group:  1155    Attend:  [x]   Not attend: []   Attendance:  inpatient attended all of group                 Summary of group discussion:Community Meeting      Donald Atkins  is a 38 y.o. male participating in a community meeting group.    Patient observations:      Patient goals:      Harlene Docker, PAT CARE [facility]  12/24/2023, 12:35

## 2023-12-24 NOTE — Nurses Notes (Signed)
 7A TO 7P NURSE NOTES; PATIENT IS DRESSED IN PICU ATTIRE AND HAS BEEN UP ON UNIT AD LIB. HAS BEEN WITH DRAWN TO ROOM. STATES SLEEP AND APPETITE HAVE BEEN GOOD. REPORTS His LAST BM WAS 2 DAYS AGO. DENIES SI, HI, AND A/V/T HALLUCINATIONS. RATES ANXIETY AND DEPRESSION AS 0 ON 0 TO 10 SCALE WITH 10 BEING THE HIGHEST. HAS BEEN REFUSING TO TAKE HALDOL  PO. STATES He HAS TO TAKE COGENTIN  WHILE He IS TAKING THE HALDOL  OTHERWISE He HAS MUSCLE SPASMS AND His JAW LOCKS UP. PROVIDER HAD WRITTEN NOTE TO NOT RESTART THE COGENTIN . He DID TAKE THE HALDOL  TODAY WITHOUT THE COGENTIN . RECEIVED A PRN VISTARIL  FOR INCREASING ANXIETY/AGITATION. WILL CONTINUE TO VISUALLY MONITOR Q 15 MINUTES AND PRN PER PROTOCOL.

## 2023-12-24 NOTE — Behavioral Health (Signed)
 [facility]      Biopsychosocial Assessment    Patient: Donald Atkins, Donald Atkins, 38 y.o., White, male  Date of Birth:  [birth year]  MRN: [id number]  Room/Bed: PICU05/B  Medical Record #: [id number]  Date of Admission: 12/23/2023  Admission Type: Voluntary  Status:  Voluntary    Medical Diagnosis and History:  Problem List[1]  Past Medical History:   Diagnosis Date    Bipolar disorder, unspecified     Borderline intellectual functioning     Convulsions     Convulsions     GAD (generalized anxiety disorder)     GERD (gastroesophageal reflux disease)     Mixed hyperlipidemia     Schizoaffective disorder     Schizophrenia        Date Performed: 12/24/2023    Attending Physician:  Prentice Balloon, MD     Informant: Information was gathered from the patient and clinical record    Chief Complaint / Problems:   Patient is a 38 y.o. White male.  Donald Atkins is being admitted voluntarily.   The patient is english speaking and no communication devices were needed during this assessment.  This is the patient's 6th or 7th psychiatric hospitalization.   The patient was admitted for psychosis.      Severity of Stressors and Maladaptive Behavior identified:    Donald Atkins rates his anxiety and depression at a 0 on a scaled of 1-10 with 10 being the worst.  He reports panic attacks,  paranoia.  Donald Atkins reports that he been sleeping  poor sleep quality.  The patient reports his appetite is good.  He reports having auditory  hallucinations.  The patient is not currently established with any outpatient provider.    Donald Atkins was made aware of his right to participate in treatment.       CURRENT FAMILY SITUATION AND PEER GROUP  The patient currently lives in  [place], [place] and is homeless but sometimes stays with his sister.  The patient does not identify with a peer group.   Donald Atkins is currently single.  He does not  have children.  He is homeless and can return to the streets.       He is the 2nd born of 3 children and was raised by  his parents.   He describes these relationships as poor with his mother, brother and stepfather. His sister is the only relative he speaks with.  He denies history of abuse.   Donald Atkins denies having significant or traumatic events which impacted childhood.   He denies having ethnic/religious/cultural practices that would influence treatment.        Sociocultural Environment and Severity of Stress.   Donald Atkins reports that he does not have a religious preference.    Donald Atkins has high school diploma/ged level education. He  does not  report having difficulty with literacy; can read and write adequately.  Patient reports he was not in special education.   Donald Atkins  did not receive vocational training.  Patient denies history of uncontrolled/problematic gambling.   Patient denies other compulsive/addictive behaviors, including sex, OCD, and hoarding.   He does not  have military history and did not serve in combat situations.       Tobacco/Chemical Substance History  Patient denies tobacco use.     Drug History and Current Pattern of Use:  Patient denies drug and alcohol use.     Substance Abuse Treatment History:  Patient denies receiving substance abuse treatment.     Developmental  History:  The patient reports that he was born in [place], [name].  The patient reports that his developmental memories of childhood were not positive.   He does not have a history of abuse as a child or sexual abuse at any course of lifetime.     He denies experiencing chronic pain or other acute medical problem (HIV/AIDS, COPD, Cancer etc.)    Activity Assessment and Discharge Recommendations:  The patient reports no current hobbies or interests. Patient needs activities to improve their socialization and relaxation skills. Patient needs activities to improve their mood and affect. Patient needs activities that will help stabilize mood/behavior. Patient needs activities that provide a positive outlet for their feelings and emotions. Patient needs  activities to improve their feelings of self worth. Patient needs activities to improve reality based functioning and testing. Patient needs activities to provide them with alternative forms of self fulfillment.     Primary Therapist Assessment:  Patient presents as a 38 year old caucasian male. Patient is rational and coherent. There are no overt signs of psychosis. Patient is alert and oriented in all spheres. Mood is neutral and affect is flat. Insight and judgement are fair. Patient is neat and clean in appearance, casually dressed in PICU attire. Eye contact is normal. Speech is within normal limits. Patient is currently denying SI, HI or V/T hallucinations. Patient endorses auditory hallucinations that come and go. There is historical evidence of psychosis and delusional thinking. Recent and remote memory appear to be intact. Patient appears to be of average intelligence.     Plan:  While on the unit, the patient will be involved in activities as outlined in the treatment plan.  Patient declined to have a family conference.  A new referral will be made to  [facility].     Discharge Plan:    Donald Atkins will return to [facility].  Outpatient services will be scheduled and patient will be encouraged to participate and follow through with outpatient treatment.    Patient will also be encouraged to attend chemical dependence support groups as needed.     Safety Plan:  Safety plan given to patient to be discussed and completed with counselor prior to discharge from facility.       Transportation:  Upon discharge, Donald Atkins will be transported by [employer].         [1]   Patient Active Problem List  Diagnosis    Suicidal ideation    Schizophrenia    GAD (generalized anxiety disorder)    Suicidal behavior with attempted self-injury (CMS HCC)    Bipolar disorder with depression (CMS HCC)    Auditory hallucinations    Suicidal ideations    Schizoaffective disorder    Psychosis

## 2023-12-24 NOTE — Nurses Notes (Signed)
 1900-0700 Nursing Shift Note: Pt was withdrawn to their room and lying in bed peacefully the majority of the shift. Pt's affect was flat and they were not very expressive during their assessment, stating only I just want to get out without a direct question. Pt is med compliant, but when it came time for medications administration, the pt stated that they needed something to eat with their medications. When a nurse told them they couldn't have anything extra, the patient left the nurse's station and laid back down in bed. When I asked the pt how I could help them take their medication, the pt stated they only asked for something to drink. The pt came back up to the med window and took their medications, but expressed that they hadn't eaten breakfast or lunch that day. The pt was given a snack. Pt denies any SI, HI, as well as denies any hallucinations. The pt denies any anxiety or depression at this time.     Staff will monitor the pt Q15 minutes and PRN.

## 2023-12-24 NOTE — Group Note (Signed)
 [facility] MEDICINE [facility], INTENSIVE CARE BEHAVIORAL MEDICINE  [address]  [place] [address]    Group Note             Name: Donald Atkins   Date of Birth: 1985-11-10   Today's Date: 12/23/2023   Group Start Time: 1400   Group End Time: 1500   Group Topic: ACTIVITY GROUP  Number of participants: 0      Summary of group discussion:   Group games to promote appropriate other interaction and decrease potential self imposed isolation tendencies.    [name]'s Participation and Response: Pt did not attend.      Suicidal/Homicidal Risk:  Currently denies SI/HI and expresses willingness to contact crisis services if needed.

## 2023-12-25 LAB — LIPID PANEL
CHOL/HDL RATIO: 4
CHOLESTEROL: 112 mg/dL (ref <200)
HDL CHOL: 28 mg/dL — ABNORMAL LOW (ref >=40)
LDL CALC: 30 mg/dL (ref 0–100)
TRIGLYCERIDES: 271 mg/dL — ABNORMAL HIGH (ref <=150)
VLDL CALC: 54 mg/dL — ABNORMAL HIGH (ref 0–50)

## 2023-12-25 LAB — GLUCOSE, NON FASTING: GLUCOSE: 86 mg/dL (ref 74–109)

## 2023-12-25 LAB — HGA1C (HEMOGLOBIN A1C WITH EST AVG GLUCOSE): HEMOGLOBIN A1C: 5.3 % (ref 4.0–6.0)

## 2023-12-25 MED ORDER — HALOPERIDOL DECANOATE 50 MG/ML INTRAMUSCULAR SOLUTION
100.0000 mg | Freq: Once | INTRAMUSCULAR | Status: AC
Start: 2023-12-25 — End: 2023-12-25
  Administered 2023-12-25: 100 mg via INTRAMUSCULAR
  Filled 2023-12-25: qty 2

## 2023-12-25 MED ORDER — OLANZAPINE 5 MG DISINTEGRATING TABLET
10.0000 mg | ORAL_TABLET | Freq: Four times a day (QID) | ORAL | Status: DC | PRN
Start: 2023-12-25 — End: 2023-12-29
  Administered 2023-12-25: 10 mg via ORAL
  Filled 2023-12-25: qty 2

## 2023-12-25 MED ORDER — HALOPERIDOL DECANOATE 50 MG/ML INTRAMUSCULAR SOLUTION
50.0000 mg | Freq: Once | INTRAMUSCULAR | Status: AC
Start: 2023-12-28 — End: 2023-12-28
  Administered 2023-12-28: 50 mg via INTRAMUSCULAR
  Filled 2023-12-25: qty 1

## 2023-12-25 MED ORDER — BENZTROPINE 1 MG TABLET
0.5000 mg | ORAL_TABLET | Freq: Two times a day (BID) | ORAL | Status: DC
Start: 2023-12-25 — End: 2023-12-29
  Administered 2023-12-25 – 2023-12-29 (×9): 0.5 mg via ORAL
  Filled 2023-12-25 (×9): qty 1

## 2023-12-25 NOTE — Nurses Notes (Signed)
 Patient alert; up AD LIB on unit with steady gait noted. During AM assessment, patient denied feelings of Anxiety and/or Depression. He denied SI/HI/AVH. He verbalized I'm just ready to get out of here and get back to work. He has been cooperative with this nurse and medication compliant. Patient did get somewhat agitated after Telemed evaluation. PRN Zyprexa  Zydis administered with effective results noted. Scheduled IM Haldol  Decanoate administered this morning per provider order. Patient tolerated well. Safe environment is maintained by staff. Q15 minute and PRN visual safety checks in progress. See MAR/Interventions.

## 2023-12-25 NOTE — Group Note (Signed)
 Group topic:  COMMUNITY MEETING GROUP    Date of group:  12/25/2023  Start time of group:  1100  End time of group:  1130    Attend:  [x]   Not attend: []   Attendance:  inpatient attended all of group                 Summary of group discussion: Community Meeting       Donald Atkins  is a 38 y.o. male participating in a community meeting group.    Patient observations:      Patient goals:      Izetta March, PAT CARE [facility]  12/25/2023, 13:30

## 2023-12-25 NOTE — Group Note (Signed)
 [facility] MEDICINE [facility], INTENSIVE CARE BEHAVIORAL MEDICINE  [address]  [place] [address]    Group Note             Name: Donald Atkins   Date of Birth: 10/12/1985   Today's Date: 12/24/2023   Group Start Time: 1000   Group End Time: 1100   Group Topic: ACTIVITY GROUP  Number of participants: 0      Summary of group discussion:   Creative expression arts/crafts opportunity.    Donald Atkins's Participation and Response: Pt did not attend.      Suicidal/Homicidal Risk:  Currently denies SI/HI and expresses willingness to contact crisis services if needed.

## 2023-12-25 NOTE — Behavioral Health (Signed)
 Patient is walking the hallway. Patient reports positive mood and wants to move to adult unit. Patient is currently denying SI, HI or A/V/T hallucinations.

## 2023-12-25 NOTE — Group Note (Signed)
 Group topic:  MEDICATION GROUP    Date of group:  12/25/2023  Start time of group:  1630  End time of group:  1645    Attend:  []   Not Attend:  [x]   Attendance:  inpatient attended 00 minutes                 Summary of group discussion: Medication management and compliance      Donald Atkins  is a 38 y.o. male participating in medication group.    Patient observations:      Patient goals:      Shona Darner, RN  12/25/2023, 17:21

## 2023-12-25 NOTE — Group Note (Signed)
 [facility] MEDICINE [facility], INTENSIVE CARE BEHAVIORAL MEDICINE  [address]  [place] [address]    Group Note             Name: Donald Atkins   Date of Birth: April 17, 1986   Today's Date: 12/24/2023   Group Start Time: 1400   Group End Time: 1430   Group Topic: ACTIVITY GROUP  Number of participants: 0      Summary of group discussion:   Creative expression arts/crafts opportunity.    [name]'s Participation and Response: Pt did not attend.      Suicidal/Homicidal Risk:  Currently denies SI/HI and expresses willingness to contact crisis services if needed.

## 2023-12-25 NOTE — Group Note (Signed)
 [facility] MEDICINE [facility], INTENSIVE CARE BEHAVIORAL MEDICINE  [address]  [place] [address]    Group Note             Name: Kron Everton   Date of Birth: Nov 18, 1985   Today's Date: 12/24/2023   Group Start Time: 1400   Group End Time: 1430   Group Topic: RECREATION GROUP  Number of participants: 3      Summary of group discussion:   Group games to promote appropriaate other interaction and decrease potential self imposed isolation tendencies.    Jerett's Participation and Response: Pt did not attend.      Suicidal/Homicidal Risk:  Currently denies SI/HI and expresses willingness to contact crisis services if needed.

## 2023-12-25 NOTE — Nurses Notes (Signed)
 7P-7A NURSING NOTE. PATIENT DENIES ANY SI/HI/AVT HALLUCINATIONS. PATIENT OUT TO DAYROOM THIS SHIFT. PATIENT CALM AND COOPERATIVE WITH MEDICATIONS.PATIENT OBSERVED RESTING IN ROOM WITH EYES CLOSED DURING THE Q15MIN AND PRN SAFETY CHECKS. WILL CONTINUE TO MONITOR PER PROTOCOL. WILL CONTINUE TO MONITOR PER PROTOCOL.

## 2023-12-25 NOTE — Progress Notes (Signed)
 [facility]    Inpatient Psychiatric Progress Note    Patient's Full Name: Donald Atkins   Patient's Date of Birth: 1986/06/02   Patient's Age: 38 y.o.   Patient's Legal Sex: male   Patient's MRN: [id number]   Patient's Date of Admission: 12/23/2023   Current Date: 12/25/2023 08:07     Patient's Room/Bed: PICU05/B       History of Present Illness:  Patient is seen today for inpatient follow up on PICU. He appears alert and oriented, appears calm. He states he is feeling good. Relates he is ready for discharge. He denies feelings of depression or anxiety. He denies suicidal or homicidal ideations. He denies AVH or feelings of paranoia. He appears very focused on being ordered Cogentin , states he is having muscle spasms in his jaw as a side effect of Haldol . He appears agitated, demanding Cogentin  and to be discharged. Will order Haldol  Dec 100 mg IM today then 50 mg IM on 7/17. Will order Cogentin  0.5mg  PO BID for EPS. Will order Zyprexa  Zydis 10 mg PO every 6 hours as needed for agitation.    Medications and Allergies:    Current Facility-Administered Medications   Medication Dose Route Frequency    acetaminophen  (TYLENOL ) tablet  650 mg Oral Q4H PRN    albuterol  90 mcg per inhalation oral inhaler - Nursing to administer  2 Puff Inhalation Q6H PRN    aluminum -magnesium  hydroxide-simethicone  (MAG-AL PLUS) 200-200-20 mg per 5 mL oral liquid  30 mL Oral Q4H PRN    divalproex  (DEPAKOTE ) 12 hr delayed release tablet  500 mg Oral 2x/day    haloperidol  (HALDOL ) tablet  5 mg Oral 3x/day    hydrOXYzine  pamoate (VISTARIL ) capsule  25 mg Oral Q8H PRN    ibuprofen  (MOTRIN ) tablet  800 mg Oral 3x/day PRN    magnesium  hydroxide (MILK OF MAGNESIA) 400mg  per 5mL oral liquid  30 mL Oral HS PRN        Allergies[1]       Vital Signs:    Filed Vitals:    12/23/23 1930 12/24/23 0700 12/24/23 2000 12/25/23 0700   BP: (!) 130/59 125/65 137/71 121/67   Pulse: 68 59 78 67   Resp: 16 18 16 18    Temp:  37.1 C (98.8 F) 36.7 C (98.1 F) 36.7 C (98.1 F) 36.9 C (98.4 F)   SpO2: 96% 97% 97% 98%        Labs:    No results found for this or any previous visit (from the past 24 hours).     Mental Status Examination:    Sensorium/Alertness: Alert,   Orientation: Date, Person, Place,   Appearance:Appears stated age,   Psychomotor Activity: Normal,   Abnormal Behaviors: None,   Attitude Argumentative  Eye Contact: Hypervigilant  Speech: Normal/Spontaneous,    Mood: good  Affect: Angry  Perception: Denies  Though Process: Logical/Clear/Goal Oriented,  Thought Content: Suicidal? Denies   Thought Content: Homicidal? no  Thought Content: Delusions? no  Impulse Control:  Grossly Intact,   Concentration/Calculation/Attention Span: WNL,  How was the patient's Concentration/Calculation/Attention tested/assessed? Per observation and interview with patient   Recent Memory: WNL,  Remote Memory: WNL,   How was the patient's Remote Memory Tested/Assessed? Past Events, as it relates to history  Intelligence/Fund of Knowledge: Average  How was the patient's Intelligence/Fund of Knowledge Tested/Assessed? Based on history, Based on vocabulary, syntax, grammar, and content  Judgement: Fair,   How was the patient's Judgement Tested/Assessed?  Per patient's behavior/history of present illness  Insight: Fair,   How was the patient's Insight Tested/Assessed? Understanding of severity of illness/history of present illness       Diagnoses:   F25.0 Schizoaffective disorder, bipolar type  Z59. 0 Homelessness      Treatment and Medication Recommendation:  -The patient is admitted for safety and stabilization.  Medicines will be instituted and adjusted as indicated.  We have discussed medicine side-effects, treatment options, and pregnancy precautions if applicable.  The patient agrees with the plan and will be encouraged to be active in groups and milieu treatment. Patient has been instructed of the unit rules and regulations and will be seen  by the hospitalist for medical issues. Further changes will be dictated by clinical course. I estimate patient length of stay will 5-7 days   -behavioral health safety checks  -regular diet, full code  -provisional discharge disposition: Homeless shelter    7/14 - Continue Depakote  500 mg BID for mood          - Continue Vistaril  25 mg Q8H PRN anxiety          - Continue Haldol  5mg  TID psychosis - Goal is Haldol  Decanoate          - Will order Haldol  Dec 100 mg IM today then 50 mg IM on 7/17.           - Will order Cogentin  0.5mg  PO BID for EPS          - Will order Zyprexa  Zydis 10 mg PO every 6 hours as needed for agitation.      I certify that the inpatient psychiatric admission continues to be medically necessary for (1) treatment which is reasonably expected to improve the patient's condition, and/or (2) for diagnostic studies    Interaction Attestation: Clinical telemedicine services delivered using HIPAA-compliant interactive video-audio telecommunications while the patient and the rendering provider were not in the same physical location. Patient agreeable to telecommunication.    TELEMEDICINE DOCUMENTATION:    Patient Location:  [facility], [address] [place], [place] [address]  Provider Location: Remote  Patient/family aware of provider location:  yes    Patient/family consent for telemedicine:  yes  Examination observed and performed by:  Powell Lowers, NP      Powell Lowers, NP         [1]   Allergies  Allergen Reactions    Latex     Penicillins Anaphylaxis    Latex

## 2023-12-25 NOTE — Group Note (Signed)
 [facility] MEDICINE [facility], INTENSIVE CARE BEHAVIORAL MEDICINE  [address]  [place] [address]    Group Note             Name: Donald Atkins   Date of Birth: 1986/01/04   Today's Date: 12/24/2023   Group Start Time: 1000   Group End Time: 1100   Group Topic: RECREATION GROUP  Number of participants: 5      Summary of group discussion:   Group games to promote appropriate other interaction and decrease potential self imposed isolation tendencies.    [name]'s Participation and Response: Pt did not attend.      Suicidal/Homicidal Risk:  Currently denies SI/HI and expresses willingness to contact crisis services if needed.

## 2023-12-25 NOTE — Care Plan (Signed)
 PATIENT DENIES ANY SI/HI/AVT HALLUCINATION    Problem: Adult Behavioral Health Plan of Care  Goal: Plan of Care Review  12/25/2023 0211 by Sherrilyn DEL, RN  Outcome: Ongoing (see interventions/notes)  12/25/2023 0202 by Sherrilyn DEL, RN  Outcome: Ongoing (see interventions/notes)  Goal: Patient-Specific Goal (Individualization)  12/25/2023 0211 by Sherrilyn DEL, RN  Outcome: Ongoing (see interventions/notes)  12/25/2023 0202 by Sherrilyn DEL, RN  Outcome: Ongoing (see interventions/notes)  Goal: Strengths and Vulnerabilities  12/25/2023 0211 by Sherrilyn DEL, RN  Outcome: Ongoing (see interventions/notes)  12/25/2023 0202 by Sherrilyn DEL, RN  Outcome: Ongoing (see interventions/notes)  Goal: Adheres to Safety Considerations for Self and Others  12/25/2023 0211 by Sherrilyn DEL, RN  Outcome: Ongoing (see interventions/notes)  12/25/2023 0202 by Sherrilyn DEL, RN  Outcome: Ongoing (see interventions/notes)  Goal: Absence of New-Onset Illness or Injury  12/25/2023 0211 by Sherrilyn DEL, RN  Outcome: Ongoing (see interventions/notes)  12/25/2023 0202 by Sherrilyn DEL, RN  Outcome: Ongoing (see interventions/notes)  Intervention: Identify and Manage Fall Risk  Recent Flowsheet Documentation  Taken 12/24/2023 2342 by Sherrilyn DEL, RN  Safety Promotion/Fall Prevention: nonskid shoes/slippers when out of bed  Goal: Optimized Coping Skills in Response to Life Stressors  12/25/2023 0211 by Sherrilyn DEL, RN  Outcome: Ongoing (see interventions/notes)  12/25/2023 0202 by Sherrilyn DEL, RN  Outcome: Ongoing (see interventions/notes)  Goal: Develops/Participates in Therapeutic Alliance to Support Successful Transition  12/25/2023 0211 by Sherrilyn DEL, RN  Outcome: Ongoing (see interventions/notes)  12/25/2023 0202 by Sherrilyn DEL, RN  Outcome: Ongoing (see interventions/notes)  Goal: Rounds/Family Conference  12/25/2023 0211 by Sherrilyn DEL, RN  Outcome: Ongoing (see interventions/notes)  12/25/2023 0202 by Sherrilyn DEL, RN  Outcome: Ongoing (see interventions/notes)

## 2023-12-26 MED ORDER — LOPERAMIDE 2 MG CAPSULE
2.0000 mg | ORAL_CAPSULE | ORAL | Status: AC
Start: 2023-12-26 — End: 2023-12-26
  Administered 2023-12-26: 2 mg via ORAL
  Filled 2023-12-26: qty 1

## 2023-12-26 NOTE — Progress Notes (Signed)
 [facility]    Inpatient Psychiatric Progress Note    Patient's Full Name: Donald Atkins   Patient's Date of Birth: June 04, 1986   Patient's Age: 38 y.o.   Patient's Legal Sex: male   Patient's MRN: [id number]   Patient's Date of Admission: 12/23/2023   Current Date: 12/26/2023 08:19     Patient's Room/Bed: PICU05/B       History of Present Illness:  Patient is seen today for inpatient follow up on the adult unit. He was moved from PICU today. He appears alert and oriented, appears calm. He states he is feeling good. He denies feelings of depression or anxiety. He denies suicidal or homicidal ideations. He denies AVH or feelings of paranoia. Cogentin  was ordered yesterday due to complaint of muscle spasms in his jaw as a side effect of Haldol  and today he relates a decrease in this symptom. He received Haldol  Dec 100 mg IM on yesterday and will receive 50 mg IM on 7/17 then 150mg  monthly. D/C plans for Friday.  Medications and Allergies:    Current Facility-Administered Medications   Medication Dose Route Frequency    acetaminophen  (TYLENOL ) tablet  650 mg Oral Q4H PRN    albuterol  90 mcg per inhalation oral inhaler - Nursing to administer  2 Puff Inhalation Q6H PRN    aluminum -magnesium  hydroxide-simethicone  (MAG-AL PLUS) 200-200-20 mg per 5 mL oral liquid  30 mL Oral Q4H PRN    benztropine  (COGENTIN ) tablet  0.5 mg Oral 2x/day    divalproex  (DEPAKOTE ) 12 hr delayed release tablet  500 mg Oral 2x/day    haloperidol  (HALDOL ) tablet  5 mg Oral 3x/day    [START ON 12/28/2023] haloperidol  decanoate (HALDOL  DECANOATE) 50 mg/mL IM injection (ER Suspension)  50 mg IntraMUSCULAR Once    hydrOXYzine  pamoate (VISTARIL ) capsule  25 mg Oral Q8H PRN    ibuprofen  (MOTRIN ) tablet  800 mg Oral 3x/day PRN    magnesium  hydroxide (MILK OF MAGNESIA) 400mg  per 5mL oral liquid  30 mL Oral HS PRN    OLANZapine  (zyPREXA  ZYDIS) rapid dissolve tablet  10 mg Oral Q6H PRN        Allergies[1]       Vital  Signs:    Filed Vitals:    12/24/23 2000 12/25/23 0700 12/25/23 1900 12/26/23 0756   BP: 137/71 121/67 128/65 112/63   Pulse: 78 67 84 67   Resp: 16 18 18 18    Temp: 36.7 C (98.1 F) 36.9 C (98.4 F) 37.1 C (98.8 F) 36.7 C (98 F)   SpO2: 97% 98% 96% 97%        Labs:    No results found for this or any previous visit (from the past 24 hours).     Mental Status Examination:    Sensorium/Alertness: Alert,   Orientation: Date, Person, Place,   Appearance:Appears stated age,   Psychomotor Activity: Normal,   Abnormal Behaviors: None,   Attitude Argumentative  Eye Contact: Hypervigilant  Speech: Normal/Spontaneous,    Mood: good  Affect: Blunted  Perception: Denies  Though Process: Logical/Clear/Goal Oriented,  Thought Content: Suicidal? Denies   Thought Content: Homicidal? no  Thought Content: Delusions? no  Impulse Control:  Grossly Intact,   Concentration/Calculation/Attention Span: WNL,  How was the patient's Concentration/Calculation/Attention tested/assessed? Per observation and interview with patient   Recent Memory: WNL,  Remote Memory: WNL,   How was the patient's Remote Memory Tested/Assessed? Past Events, as it relates to history  Intelligence/Fund of Knowledge: Average  How was the patient's Intelligence/Fund of Knowledge Tested/Assessed? Based on history, Based on vocabulary, syntax, grammar, and content  Judgement: Fair,   How was the patient's Judgement Tested/Assessed? Per patient's behavior/history of present illness  Insight: Fair,   How was the patient's Insight Tested/Assessed? Understanding of severity of illness/history of present illness       Diagnoses:   F25.0 Schizoaffective disorder, bipolar type  Z59. 0 Homelessness      Treatment and Medication Recommendation:  -The patient is admitted for safety and stabilization.  Medicines will be instituted and adjusted as indicated.  We have discussed medicine side-effects, treatment options, and pregnancy precautions if applicable.  The patient  agrees with the plan and will be encouraged to be active in groups and milieu treatment. Patient has been instructed of the unit rules and regulations and will be seen by the hospitalist for medical issues. Further changes will be dictated by clinical course. I estimate patient length of stay will 5-7 days   -behavioral health safety checks  -regular diet, full code  -provisional discharge disposition: Homeless shelter    7/15 - No med changes. D/C plan for Friday  7/14 - Continue Depakote  500 mg BID for mood          - Continue Vistaril  25 mg Q8H PRN anxiety          - Continue Haldol  5mg  TID psychosis - Goal is Haldol  Decanoate          - Will order Haldol  Dec 100 mg IM today then 50 mg IM on 7/17 then 150mg  monthly          - Will order Cogentin  0.5mg  PO BID for EPS          - Will order Zyprexa  Zydis 10 mg PO every 6 hours as needed for agitation.      I certify that the inpatient psychiatric admission continues to be medically necessary for (1) treatment which is reasonably expected to improve the patient's condition, and/or (2) for diagnostic studies    Interaction Attestation: Clinical telemedicine services delivered using HIPAA-compliant interactive video-audio telecommunications while the patient and the rendering provider were not in the same physical location. Patient agreeable to telecommunication.    TELEMEDICINE DOCUMENTATION:    Patient Location:  [facility], [address], [place], [place] [address]  Provider Location: Remote  Patient/family aware of provider location:  yes    Patient/family consent for telemedicine:  yes  Examination observed and performed by:  Powell Lowers, NP      Powell Lowers, NP         [1]   Allergies  Allergen Reactions    Latex     Penicillins Anaphylaxis    Latex

## 2023-12-26 NOTE — Group Note (Signed)
 [facility] MEDICINE [facility], ADULT BEHAVIORAL MEDICINE  [address]  [place] [address]    Group Note             Name: Donald Atkins   Date of Birth: Feb 24, 1986   Today's Date: 12/25/2023   Group Start Time: 1430   Group End Time: 1530   Group Topic: RECREATION GROUP  Number of participants: 11      Summary of group discussion:   Outdoor group games to promote appropriate other interaction and decrease potential self imposed isolation tendencies.    [name]'s Participation and Response: Pt on other unit this day/date.      Suicidal/Homicidal Risk:  Currently denies SI/HI and expresses willingness to contact crisis services if needed.

## 2023-12-26 NOTE — Group Note (Signed)
 Psychoeducational Group Note  Date of group:  12/26/2023  Start time of group:  1300  End time of group:  1400    Attend:  [x]   Not attend:  []   Attendance:  inpatient attended all of group               Summary of group discussion:    Using music as an effective coping skill.       Jory Tanguma  s a 38 y.o. male participating in a psychoeducational group.    Affect/Mood:  Appropriate    Thought Process:  Logical and Goal directed    Thought Content:  Within normal limits    Interpersonal:  Discussed issues and Attentive    Level of participation:  Partial    Comments:     Redell MARLA Laine, Licensed Professional Counselor 12/26/2023 15:04

## 2023-12-26 NOTE — Group Note (Signed)
 [facility] MEDICINE [facility], ADULT BEHAVIORAL MEDICINE  [address]  [place] [address]    Group Note             Name: Donald Atkins   Date of Birth: 01-03-1986   Today's Date: 12/25/2023   Group Start Time: 1700   Group End Time: 1800   Group Topic: ACTIVITY GROUP  Number of participants: 10      Summary of group discussion:   Music appreciation and its use as an effective stress reliever.    [name]'s Participation and Response: Pt did not attend.      Suicidal/Homicidal Risk:  Currently denies SI/HI and expresses willingness to contact crisis services if needed.

## 2023-12-26 NOTE — Nurses Notes (Signed)
 Pt A&O x4 and calm and cooperative. Pt gait steady with no apparent issues. Pt denies SI, HI, and AVH. Pt rates anxiety 0/10 and depression 0/10. Pt denies any new or worsening pain. Safe environment maintained. Q15 minute and PRN rounds continued.     PRN Tylenol  administered for 3/10 R ear pain.

## 2023-12-26 NOTE — Care Plan (Signed)
 Care Plan reviewed and continued.    Pt will continue to be monitored q 15 min and every shift per policy.    See JULIEANN /interventions for medication administration and nursing assessments      Problem: Adult Behavioral Health Plan of Care  Goal: Patient-Specific Goal (Individualization)  Recent Flowsheet Documentation  Taken 12/25/2023 2010 by Greig DEL, RN  Individualized Care Needs: Effective Coping skills  Anxieties, Fears or Concerns: pt denies anxiety/depression  Goal: Adheres to Safety Considerations for Self and Others  Intervention: Develop and Maintain Individualized Safety Plan  Recent Flowsheet Documentation  Taken 12/25/2023 2010 by Greig DEL, RN  Safety Measures: safety rounds completed  Goal: Absence of New-Onset Illness or Injury  Intervention: Identify and Manage Fall Risk  Recent Flowsheet Documentation  Taken 12/25/2023 2010 by Greig DEL, RN  Safety Promotion/Fall Prevention: nonskid shoes/slippers when out of bed  Intervention: Prevent Infection  Recent Flowsheet Documentation  Taken 12/25/2023 2010 by Greig DEL, RN  Infection Prevention: promote handwashing  Goal: Develops/Participates in Therapeutic Alliance to Support Successful Transition  Intervention: [facility]  Recent Flowsheet Documentation  Taken 12/25/2023 2010 by Greig DEL, RN  Trust Relationship/Rapport: care explained     Problem: Anxiety Signs/Symptoms  Goal: Optimized Energy Level (Anxiety Signs/Symptoms)  Intervention: Optimize Energy Level  Recent Flowsheet Documentation  Taken 12/25/2023 2010 by Greig DEL, RN  Diversional Activity:   television   art work  Goal: Optimized Cognitive Function (Anxiety Signs/Symptoms)  Intervention: Support and Promote Cognitive Ability  Recent Flowsheet Documentation  Taken 12/25/2023 2010 by Greig DEL, RN  Communication Support Strategies: active listening utilized  Goal: Improved Sleep (Anxiety Signs/Symptoms)  Intervention: Promote Healthy Sleep Hygiene  Recent Flowsheet Documentation  Taken 12/25/2023 2010 by  Greig DEL, RN  Sleep Hygiene Promotion: awakenings minimized  Goal: Enhanced Social, Occupational or Functional Skills (Anxiety Signs/Symptoms)  Intervention: Promote Social, Occupational and Functional Ability  Recent Flowsheet Documentation  Taken 12/25/2023 2010 by Greig DEL, RN  Trust Relationship/Rapport: care explained     Problem: Depressive Signs/Symptoms  Goal: Optimized Energy Level (Depressive Signs/Symptoms)  Intervention: Optimize Energy Level  Recent Flowsheet Documentation  Taken 12/25/2023 2010 by Greig DEL, RN  Diversional Activity:   television   art work  Goal: Optimized Cognitive Function (Depressive Signs/Symptoms)  Intervention: Support and Promote Cognitive Ability  Recent Flowsheet Documentation  Taken 12/25/2023 2010 by Greig DEL, RN  Communication Support Strategies: active listening utilized  Goal: Increased Participation and Engagement (Depressive Signs/Symptoms)  Intervention: Facilitate Participation and Engagement  Recent Flowsheet Documentation  Taken 12/25/2023 2010 by Greig DEL, RN  Diversional Activity:   television   art work  Goal: Event organiser and Confidence (Depressive Signs/Symptoms)  Intervention: Promote Confidence and Self-Esteem  Recent Flowsheet Documentation  Taken 12/25/2023 2010 by Greig DEL, RN  Diversional Activity:   television   art work  Goal: Improved Mood Symptoms (Depressive Signs/Symptoms)  Intervention: Promote Mood Improvement  Recent Flowsheet Documentation  Taken 12/25/2023 2010 by Greig DEL, RN  Diversional Activity:   television   art work  Goal: Optimized Nutrition Intake (Depressive Signs/Symptoms)  Intervention: Promote Optimal Nutrition Intake  Recent Flowsheet Documentation  Taken 12/25/2023 2010 by Greig DEL, RN  Bowel Elimination Promotion: adequate fluid intake promoted  Goal: Improved Psychomotor Symptoms (Depressive Signs/Symptoms)  Intervention: Manage Psychomotor Movement  Recent Flowsheet Documentation  Taken 12/25/2023 2010 by Greig DEL, RN  Diversional Activity:    television   art work  Goal: Improved Sleep (Depressive Signs/Symptoms)  Intervention:  Promote Healthy Sleep Hygiene  Recent Flowsheet Documentation  Taken 12/25/2023 2010 by Greig DEL, RN  Sleep Hygiene Promotion: awakenings minimized     Problem: Fall Injury Risk  Goal: Absence of Fall and Fall-Related Injury  Intervention: Identify and Manage Contributors  Recent Flowsheet Documentation  Taken 12/25/2023 2010 by Greig DEL, RN  Self-Care Promotion: independence encouraged  Intervention: Promote Injury-Free Environment  Recent Flowsheet Documentation  Taken 12/25/2023 2010 by Greig DEL, RN  Safety Promotion/Fall Prevention: nonskid shoes/slippers when out of bed     Problem: Pain Acute  Goal: Optimal Pain Control and Function  Intervention: Prevent or Manage Pain  Recent Flowsheet Documentation  Taken 12/25/2023 2010 by Greig DEL, RN  Sleep/Rest Enhancement: awakenings minimized  Bowel Elimination Promotion: adequate fluid intake promoted     Problem: Psychotic Signs/Symptoms  Goal: Optimal Cognitive Function (Psychotic Signs/Symptoms)  Intervention: Support and Promote Cognitive Ability  Recent Flowsheet Documentation  Taken 12/25/2023 2010 by Greig DEL, RN  Trust Relationship/Rapport: care explained  Goal: Increased Participation and Engagement (Psychotic Signs/Symptoms)  Intervention: Facilitate Participation and Engagement  Recent Flowsheet Documentation  Taken 12/25/2023 2010 by Greig DEL, RN  Diversional Activity:   television   art work  Goal: Improved Mood Symptoms  Intervention: Optimize Emotion and Mood  Recent Flowsheet Documentation  Taken 12/25/2023 2010 by Greig DEL, RN  Diversional Activity:   television   art work  Goal: Improved Psychomotor Symptoms (Psychotic Signs/Symptoms)  Intervention: Manage Psychomotor Movement  Recent Flowsheet Documentation  Taken 12/25/2023 2010 by Greig DEL, RN  Diversional Activity:   television   art work  Goal: Improved Sleep (Psychotic Signs/Symptoms)  Intervention: Promote Healthy Sleep Hygiene  Recent  Flowsheet Documentation  Taken 12/25/2023 2010 by Greig DEL, RN  Sleep Hygiene Promotion: awakenings minimized  Goal: Enhanced Social, Occupational or Functional Skills (Psychotic Signs/Symptoms)  Intervention: Promote Social, Occupational and Functional Ability  Recent Flowsheet Documentation  Taken 12/25/2023 2010 by Greig DEL, RN  Trust Relationship/Rapport: care explained     Problem: Suicide Risk  Goal: Absence of Self-Harm  Intervention: Promote Psychosocial Wellbeing  Recent Flowsheet Documentation  Taken 12/25/2023 2010 by Greig DEL, RN  Sleep/Rest Enhancement: awakenings minimized

## 2023-12-26 NOTE — Nurses Notes (Signed)
 Patient moved from PICU 5B to Adult unit 210B per provider order. No questions or concerns voiced. During AM assessment, patient denied feelings of Anxiety and/or Depression. He denied SI/HI/AVH. He has been cooperative with staff and medication compliant. Safe environment maintained. Q15 minute and PRN visual safety checks in progress. See MAR/Interventions.

## 2023-12-26 NOTE — Nurses Notes (Signed)
 7p-7a   Nursing shift note. Pt Aox4. Pt rates anxiety at 0/10 and depressions 0/10. Pt denies SI, HI, and A/V/T hallucinations.  Pt calm and cooperative this shift.   Pt pacing in room and walking in hallway.   Pt reports eating well and sleeping well. Pt is medication compliant.   No concerns noted.  No PRN medications requested this shift      Will continue to visually monitor Q15 minutes and prn throughout shift by staff.

## 2023-12-26 NOTE — Group Note (Signed)
 [facility] MEDICINE [facility] ADULT BEHAVIORAL MEDICINE  [address]  [place] [address]    Group Note             Name: Donald Atkins   Date of Birth: [birth year]   Today's Date: 12/26/2023   Group Start Time: 1200   Group End Time: 1300   Group Topic: RECREATION GROUP  Number of participants: 12      Summary of group discussion:   Outdoor group games to promote appropriate other interaction and decrease potentail self imposed isolation tendencies.    Mirza's Participation and Response: Sporadic participation, left group setting early and did not return.      Suicidal/Homicidal Risk:  Currently denies SI/HI and expresses willingness to contact crisis services if needed.
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
## 2023-12-27 ENCOUNTER — Ambulatory Visit (HOSPITAL_PSYCHIATRIC): Payer: Self-pay

## 2023-12-27 MED ORDER — LOPERAMIDE 2 MG CAPSULE
2.0000 mg | ORAL_CAPSULE | ORAL | Status: DC | PRN
Start: 2023-12-27 — End: 2023-12-29
  Administered 2023-12-27: 2 mg via ORAL
  Filled 2023-12-27: qty 1

## 2023-12-27 NOTE — Group Note (Signed)
 Group topic:  PROCESS THERAPY    Date of group:  12/27/2023  Start time of group:  1000  End time of group:  1100    Attend:  [x]   Not Attend: []   Attendance:  inpatient attended all of group                 Summary of group discussion:  Deepening Understanding of Forgiveness    Donald Atkins  is a 38 y.o. male participating in a process group.    Affect/Mood:  Appropriate    Thought Process:  Logical and Goal directed    Thought Content:  Within normal limits    Interpersonal:  Discussed issues, Attentive, Displayed insight, and Provided feedback    Level of participation:  Full    Comments:    Lonni Edison, [facility]  12/27/2023, 11:17

## 2023-12-27 NOTE — Progress Notes (Signed)
 [facility]    Inpatient Psychiatric Progress Note    Patient's Full Name: Donald Atkins   Patient's Date of Birth: 1985/10/13   Patient's Age: 38 y.o.   Patient's Legal Sex: male   Patient's MRN: [id number]   Patient's Date of Admission: 12/23/2023   Current Date: 12/27/2023 08:09     Patient's Room/Bed: 210/B       History of Present Illness:  Patient is seen today for inpatient follow up on the adult unit. He appears alert and oriented, appears calm. He states he is feeling good. He denies feelings of depression or anxiety. He denies suicidal or homicidal ideations. He denies AVH or feelings of paranoia. He received Haldol  Dec 100 mg IM on yesterday and will receive 50 mg IM on 7/17 then 150mg  monthly. D/C plans for Friday.    Medications and Allergies:    Current Facility-Administered Medications   Medication Dose Route Frequency    acetaminophen  (TYLENOL ) tablet  650 mg Oral Q4H PRN    albuterol  90 mcg per inhalation oral inhaler - Nursing to administer  2 Puff Inhalation Q6H PRN    aluminum -magnesium  hydroxide-simethicone  (MAG-AL PLUS) 200-200-20 mg per 5 mL oral liquid  30 mL Oral Q4H PRN    benztropine  (COGENTIN ) tablet  0.5 mg Oral 2x/day    divalproex  (DEPAKOTE ) 12 hr delayed release tablet  500 mg Oral 2x/day    haloperidol  (HALDOL ) tablet  5 mg Oral 3x/day    [START ON 12/28/2023] haloperidol  decanoate (HALDOL  DECANOATE) 50 mg/mL IM injection (ER Suspension)  50 mg IntraMUSCULAR Once    hydrOXYzine  pamoate (VISTARIL ) capsule  25 mg Oral Q8H PRN    ibuprofen  (MOTRIN ) tablet  800 mg Oral 3x/day PRN    magnesium  hydroxide (MILK OF MAGNESIA) 400mg  per 5mL oral liquid  30 mL Oral HS PRN    OLANZapine  (zyPREXA  ZYDIS) rapid dissolve tablet  10 mg Oral Q6H PRN        Allergies[1]       Vital Signs:    Filed Vitals:    12/25/23 0700 12/25/23 1900 12/26/23 0756 12/26/23 1939   BP: 121/67 128/65 112/63 124/81   Pulse: 67 84 67 98   Resp: 18 18 18 18    Temp: 36.9 C (98.4 F)  37.1 C (98.8 F) 36.7 C (98 F) 37 C (98.6 F)   SpO2: 98% 96% 97% 98%        Labs:    No results found for this or any previous visit (from the past 24 hours).     Mental Status Examination:    Sensorium/Alertness: Alert,   Orientation: Date, Person, Place,   Appearance:Appears stated age,   Psychomotor Activity: Normal,   Abnormal Behaviors: None,   Attitude Argumentative  Eye Contact: WNL  Speech: Normal/Spontaneous,    Mood: good  Affect: Blunted  Perception: Denies  Though Process: Logical/Clear/Goal Oriented,  Thought Content: Suicidal? Denies   Thought Content: Homicidal? no  Thought Content: Delusions? no  Impulse Control:  Grossly Intact,   Concentration/Calculation/Attention Span: WNL,  How was the patient's Concentration/Calculation/Attention tested/assessed? Per observation and interview with patient   Recent Memory: WNL,  Remote Memory: WNL,   How was the patient's Remote Memory Tested/Assessed? Past Events, as it relates to history  Intelligence/Fund of Knowledge: Average  How was the patient's Intelligence/Fund of Knowledge Tested/Assessed? Based on history, Based on vocabulary, syntax, grammar, and content  Judgement: Fair,   How was the patient's Judgement Tested/Assessed? Per patient's  behavior/history of present illness  Insight: Fair,   How was the patient's Insight Tested/Assessed? Understanding of severity of illness/history of present illness       Diagnoses:   F25.0 Schizoaffective disorder, bipolar type  Z59. 0 Homelessness      Treatment and Medication Recommendation:  -The patient is admitted for safety and stabilization.  Medicines will be instituted and adjusted as indicated.  We have discussed medicine side-effects, treatment options, and pregnancy precautions if applicable.  The patient agrees with the plan and will be encouraged to be active in groups and milieu treatment. Patient has been instructed of the unit rules and regulations and will be seen by the hospitalist for  medical issues. Further changes will be dictated by clinical course. I estimate patient length of stay will 5-7 days   -behavioral health safety checks  -regular diet, full code  -provisional discharge disposition: Homeless shelter    7/16 - No med changes. D/C plan for Friday  7/15 - No med changes. D/C plan for Friday  7/14 - Continue Depakote  500 mg BID for mood          - Continue Vistaril  25 mg Q8H PRN anxiety          - Continue Haldol  5mg  TID psychosis - Goal is Haldol  Decanoate          - Will order Haldol  Dec 100 mg IM today then 50 mg IM on 7/17 then 150mg  monthly          - Will order Cogentin  0.5mg  PO BID for EPS          - Will order Zyprexa  Zydis 10 mg PO every 6 hours as needed for agitation.      I certify that the inpatient psychiatric admission continues to be medically necessary for (1) treatment which is reasonably expected to improve the patient's condition, and/or (2) for diagnostic studies    Interaction Attestation: Clinical telemedicine services delivered using HIPAA-compliant interactive video-audio telecommunications while the patient and the rendering provider were not in the same physical location. Patient agreeable to telecommunication.    TELEMEDICINE DOCUMENTATION:    Patient Location:  [facility], [address], [place], [place] [address]  Provider Location: Remote  Patient/family aware of provider location:  yes    Patient/family consent for telemedicine:  yes  Examination observed and performed by:  Powell Lowers, NP      Powell Lowers, NP         [1]   Allergies  Allergen Reactions    Latex     Penicillins Anaphylaxis    Latex

## 2023-12-27 NOTE — Care Plan (Signed)
 PROGRESSING TOWARDS CARE GOALS  Problem: Adult Behavioral Health Plan of Care  Goal: Plan of Care Review  Outcome: Ongoing (see interventions/notes)  Goal: Patient-Specific Goal (Individualization)  Outcome: Ongoing (see interventions/notes)  Goal: Adheres to Safety Considerations for Self and Others  Outcome: Ongoing (see interventions/notes)  Goal: Absence of New-Onset Illness or Injury  Outcome: Ongoing (see interventions/notes)  Goal: Optimized Coping Skills in Response to Life Stressors  Outcome: Ongoing (see interventions/notes)  Goal: Develops/Participates in Therapeutic Alliance to Support Successful Transition  Outcome: Ongoing (see interventions/notes)     Problem: Anxiety Signs/Symptoms  Goal: Optimized Energy Level (Anxiety Signs/Symptoms)  Outcome: Ongoing (see interventions/notes)  Goal: Optimized Cognitive Function (Anxiety Signs/Symptoms)  Outcome: Ongoing (see interventions/notes)  Goal: Improved Mood Symptoms (Anxiety Signs/Symptoms)  Outcome: Ongoing (see interventions/notes)  Goal: Improved Sleep (Anxiety Signs/Symptoms)  Outcome: Ongoing (see interventions/notes)  Goal: Enhanced Social, Occupational or Functional Skills (Anxiety Signs/Symptoms)  Outcome: Ongoing (see interventions/notes)  Goal: Improved Somatic Symptoms (Anxiety Signs/Symptoms)  Outcome: Ongoing (see interventions/notes)     Problem: Depressive Signs/Symptoms  Goal: Optimized Energy Level (Depressive Signs/Symptoms)  Outcome: Ongoing (see interventions/notes)  Goal: Optimized Cognitive Function (Depressive Signs/Symptoms)  Outcome: Ongoing (see interventions/notes)  Goal: Increased Participation and Engagement (Depressive Signs/Symptoms)  Outcome: Ongoing (see interventions/notes)  Goal: Enhanced Self-Esteem and Confidence (Depressive Signs/Symptoms)  Outcome: Ongoing (see interventions/notes)  Goal: Improved Mood Symptoms (Depressive Signs/Symptoms)  Outcome: Ongoing (see interventions/notes)  Goal: Optimized Nutrition Intake  (Depressive Signs/Symptoms)  Outcome: Ongoing (see interventions/notes)  Goal: Improved Psychomotor Symptoms (Depressive Signs/Symptoms)  Outcome: Ongoing (see interventions/notes)  Goal: Improved Sleep (Depressive Signs/Symptoms)  Outcome: Ongoing (see interventions/notes)  Goal: Enhanced Social, Occupational or Functional Skills (Depressive Signs/Symptoms)  Outcome: Ongoing (see interventions/notes)     Problem: Fall Injury Risk  Goal: Absence of Fall and Fall-Related Injury  Outcome: Ongoing (see interventions/notes)     Problem: Pain Acute  Goal: Optimal Pain Control and Function  Outcome: Ongoing (see interventions/notes)     Problem: Psychotic Signs/Symptoms  Goal: Improved Behavioral Control (Psychotic Signs/Symptoms)  Outcome: Ongoing (see interventions/notes)  Goal: Optimal Cognitive Function (Psychotic Signs/Symptoms)  Outcome: Ongoing (see interventions/notes)  Goal: Increased Participation and Engagement (Psychotic Signs/Symptoms)  Outcome: Ongoing (see interventions/notes)  Goal: Improved Mood Symptoms  Outcome: Ongoing (see interventions/notes)  Goal: Improved Psychomotor Symptoms (Psychotic Signs/Symptoms)  Outcome: Ongoing (see interventions/notes)  Goal: Decreased Sensory Symptoms (Psychotic Signs/Symptoms)  Outcome: Ongoing (see interventions/notes)  Goal: Improved Sleep (Psychotic Signs/Symptoms)  Outcome: Ongoing (see interventions/notes)  Goal: Enhanced Social, Occupational or Functional Skills (Psychotic Signs/Symptoms)  Outcome: Ongoing (see interventions/notes)     Problem: Violence Risk or Actual  Goal: Anger and Impulse Control  Outcome: Ongoing (see interventions/notes)     Problem: Suicide Risk  Goal: Absence of Self-Harm  Outcome: Ongoing (see interventions/notes)     Problem: Adult Treatment Plan  Goal: Patient will meet daily with attending physician.  Description: Physicians will evaluate patient, discuss diagnosis, prescribe medications and order diagnostic testing as clinically  indicated. Physicians will monitor safety risks and assess when appropriate for discharge.  Outcome: Ongoing (see interventions/notes)  Goal: Patient will meet individually daily with Clinical Therapist.  Outcome: Ongoing (see interventions/notes)  Goal: Patient will deny suicidal/homicidal ideation by day of discharge to Physician.  Outcome: Ongoing (see interventions/notes)  Goal: Patient will participate in 100% of morning affirmation and psychoeducational groups provided daily by Mental Health Specialist.  Outcome: Ongoing (see interventions/notes)  Goal: Patient will participate in 100% of therapy groups facilitated by Clinical Therapist.  Description: Topics will include  Dialectical Behavior Therapy with the components of Mindfulness, Distress Tolerance, Emotion Regulation and Crisis Survival Skills, as well as Stress Management skills.  Outcome: Ongoing (see interventions/notes)  Goal: Patient will participate in 100% of experiential therapy group provided by Experiential Therapist.  Outcome: Ongoing (see interventions/notes)  Goal: Patient will participate in 100% of recreational groups provided by Recreational Therapist.  Outcome: Ongoing (see interventions/notes)

## 2023-12-27 NOTE — Nurses Notes (Signed)
 Patient medicated early with immodium for diarrhea,patient voiced having no more diarrhea

## 2023-12-27 NOTE — Group Note (Signed)
 Group topic:  COMMUNITY MEETING GROUP    Date of group:  12/27/2023  Start time of group:  2100  End time of group:  2115    Attend:  [x]   Not attend: []   Attendance:  inpatient attended all of group                 Summary of group discussion:      Kalev Temme  is a 38 y.o. male participating in a community meeting group.    Patient observations:      Patient goals:      Jeoffrey Pereyra, RN  12/27/2023, 20:51

## 2023-12-27 NOTE — Group Note (Signed)
 Group topic:  COMMUNITY MEETING GROUP    Date of group:  12/27/2023  Start time of group:  0930  End time of group:  1000    Attend:  [x]   Not attend: []   Attendance:  inpatient attended all of group                 Summary of group discussion: [employer] and opportunity to ask questions      Donald Atkins  is a 38 y.o. male participating in a community meeting group.    Patient observations:      Patient goals:      Lorette Gallus, LPN  2/83/7974, 09:59

## 2023-12-27 NOTE — Care Plan (Signed)
 PLAN OF CARE IS ONGOING.  Problem: Adult Behavioral Health Plan of Care  Goal: Plan of Care Review  Outcome: Ongoing (see interventions/notes)  Goal: Patient-Specific Goal (Individualization)  Outcome: Ongoing (see interventions/notes)  Goal: Adheres to Safety Considerations for Self and Others  Outcome: Ongoing (see interventions/notes)  Goal: Absence of New-Onset Illness or Injury  Outcome: Ongoing (see interventions/notes)  Goal: Optimized Coping Skills in Response to Life Stressors  Outcome: Ongoing (see interventions/notes)  Goal: Develops/Participates in Therapeutic Alliance to Support Successful Transition  Outcome: Ongoing (see interventions/notes)  Goal: Rounds/Family Conference  Outcome: Ongoing (see interventions/notes)     Problem: Anxiety Signs/Symptoms  Goal: Optimized Energy Level (Anxiety Signs/Symptoms)  Outcome: Ongoing (see interventions/notes)  Goal: Optimized Cognitive Function (Anxiety Signs/Symptoms)  Outcome: Ongoing (see interventions/notes)  Goal: Improved Mood Symptoms (Anxiety Signs/Symptoms)  Outcome: Ongoing (see interventions/notes)  Goal: Improved Sleep (Anxiety Signs/Symptoms)  Outcome: Ongoing (see interventions/notes)  Goal: Enhanced Social, Occupational or Functional Skills (Anxiety Signs/Symptoms)  Outcome: Ongoing (see interventions/notes)  Goal: Improved Somatic Symptoms (Anxiety Signs/Symptoms)  Outcome: Ongoing (see interventions/notes)     Problem: Depressive Signs/Symptoms  Goal: Optimized Energy Level (Depressive Signs/Symptoms)  Outcome: Ongoing (see interventions/notes)  Goal: Optimized Cognitive Function (Depressive Signs/Symptoms)  Outcome: Ongoing (see interventions/notes)  Goal: Increased Participation and Engagement (Depressive Signs/Symptoms)  Outcome: Ongoing (see interventions/notes)  Goal: Enhanced Self-Esteem and Confidence (Depressive Signs/Symptoms)  Outcome: Ongoing (see interventions/notes)  Goal: Improved Mood Symptoms (Depressive Signs/Symptoms)  Outcome:  Ongoing (see interventions/notes)  Goal: Optimized Nutrition Intake (Depressive Signs/Symptoms)  Outcome: Ongoing (see interventions/notes)  Goal: Improved Psychomotor Symptoms (Depressive Signs/Symptoms)  Outcome: Ongoing (see interventions/notes)  Goal: Improved Sleep (Depressive Signs/Symptoms)  Outcome: Ongoing (see interventions/notes)  Goal: Enhanced Social, Occupational or Functional Skills (Depressive Signs/Symptoms)  Outcome: Ongoing (see interventions/notes)     Problem: Fall Injury Risk  Goal: Absence of Fall and Fall-Related Injury  Outcome: Ongoing (see interventions/notes)     Problem: Pain Acute  Goal: Optimal Pain Control and Function  Outcome: Ongoing (see interventions/notes)     Problem: Psychotic Signs/Symptoms  Goal: Improved Behavioral Control (Psychotic Signs/Symptoms)  Outcome: Ongoing (see interventions/notes)  Goal: Optimal Cognitive Function (Psychotic Signs/Symptoms)  Outcome: Ongoing (see interventions/notes)  Goal: Increased Participation and Engagement (Psychotic Signs/Symptoms)  Outcome: Ongoing (see interventions/notes)  Goal: Improved Mood Symptoms  Outcome: Ongoing (see interventions/notes)  Goal: Improved Psychomotor Symptoms (Psychotic Signs/Symptoms)  Outcome: Ongoing (see interventions/notes)  Goal: Decreased Sensory Symptoms (Psychotic Signs/Symptoms)  Outcome: Ongoing (see interventions/notes)  Goal: Improved Sleep (Psychotic Signs/Symptoms)  Outcome: Ongoing (see interventions/notes)  Goal: Enhanced Social, Occupational or Functional Skills (Psychotic Signs/Symptoms)  Outcome: Ongoing (see interventions/notes)     Problem: Violence Risk or Actual  Goal: Anger and Impulse Control  Outcome: Ongoing (see interventions/notes)     Problem: Suicide Risk  Goal: Absence of Self-Harm  Outcome: Ongoing (see interventions/notes)     Problem: Adult Treatment Plan  Goal: Patient will meet daily with attending physician.  Description: Physicians will evaluate patient, discuss diagnosis,  prescribe medications and order diagnostic testing as clinically indicated. Physicians will monitor safety risks and assess when appropriate for discharge.  Outcome: Ongoing (see interventions/notes)  Goal: Patient will meet individually daily with Clinical Therapist.  Outcome: Ongoing (see interventions/notes)  Goal: Patient will deny suicidal/homicidal ideation by day of discharge to Physician.  Outcome: Ongoing (see interventions/notes)  Goal: Patient will participate in 100% of morning affirmation and psychoeducational groups provided daily by Mental Health Specialist.  Outcome: Ongoing (see interventions/notes)  Goal: Patient will participate in 100% of therapy  groups facilitated by Clinical Therapist.  Description: Topics will include Dialectical Behavior Therapy with the components of Mindfulness, Distress Tolerance, Emotion Regulation and Crisis Survival Skills, as well as Stress Management skills.  Outcome: Ongoing (see interventions/notes)  Goal: Patient will participate in 100% of experiential therapy group provided by Experiential Therapist.  Outcome: Ongoing (see interventions/notes)  Goal: Patient will participate in 100% of recreational groups provided by Recreational Therapist.  Outcome: Ongoing (see interventions/notes)

## 2023-12-27 NOTE — Nurses Notes (Signed)
 Patient has been up dressed and in the dayroom socializing.he reports he sleeps ok and he has a good appetite.he denies si/hi and a/v/h and rates anxiety a 0/10 and depression a 0/10.he received prn tylenol  for c/o ear ache and the tylenol  relived his discomfort.will continue to visually monitor q 15 minutes and prn

## 2023-12-28 MED ORDER — HALOPERIDOL DECANOATE 50 MG/ML INTRAMUSCULAR SOLUTION
150.0000 mg | INTRAMUSCULAR | Status: DC
Start: 2024-01-29 — End: 2023-12-29

## 2023-12-28 MED ORDER — BENZTROPINE 0.5 MG TABLET
0.5000 mg | ORAL_TABLET | Freq: Two times a day (BID) | ORAL | 0 refills | Status: DC
Start: 2023-12-28 — End: 2024-01-03

## 2023-12-28 NOTE — Nurses Notes (Signed)
 7a-7p: Shift note;     Patient is up and about in the unit, steady gait, no acute distress noted. Patient appears calm, cooperative with flat affect. Patient denied anxiety or depression. Patient denied SI/HI. Patient denied AV hallucinations. Ordered meds given and well tolerated. Meals and snacks provided and encouraged. Patient verbalized adequate sleep and good appetite. Activity and participation encouraged. Patient continues to be monitored visually, Q15 minutes by staff.

## 2023-12-28 NOTE — Group Note (Signed)
 [facility] MEDICINE [facility], ADULT BEHAVIORAL MEDICINE  [address]  [place] [address]    Group Note             Name: Donald Atkins   Date of Birth: 1986/03/25   Today's Date: 12/27/2023   Group Start Time: 1430   Group End Time: 1530   Group Topic: RECREATION GROUP  Number of participants: 9      Summary of group discussion:   Outdoor group games to promote appropriate other interaction and decrease potential self imposed isolation tendencies.    Jorell's Participation and Response: Pt required some redirection efforts of which this pt responded to well as evidenced by his correction of negative behavior and becoming appropriate for setting.      Suicidal/Homicidal Risk:  Currently denies SI/HI and expresses willingness to contact crisis services if needed.

## 2023-12-28 NOTE — Care Plan (Signed)
 Reviewed, revised, on going  Problem: Adult Behavioral Health Plan of Care  Goal: Plan of Care Review  12/28/2023 0043 by Karilyn MATSU, RN  Outcome: Ongoing (see interventions/notes)  12/28/2023 0040 by Karilyn MATSU, RN  Outcome: Goal Revised  Goal: Patient-Specific Goal (Individualization)  12/28/2023 0043 by Karilyn MATSU, RN  Outcome: Ongoing (see interventions/notes)  12/28/2023 0040 by Karilyn MATSU, RN  Outcome: Goal Revised  Goal: Strengths and Vulnerabilities  12/28/2023 0043 by Karilyn MATSU, RN  Outcome: Ongoing (see interventions/notes)  12/28/2023 0040 by Karilyn MATSU, RN  Outcome: Goal Revised  Goal: Adheres to Safety Considerations for Self and Others  12/28/2023 0043 by Karilyn MATSU, RN  Outcome: Ongoing (see interventions/notes)  12/28/2023 0040 by Karilyn MATSU, RN  Outcome: Goal Revised  Goal: Absence of New-Onset Illness or Injury  12/28/2023 0043 by Karilyn MATSU, RN  Outcome: Ongoing (see interventions/notes)  12/28/2023 0040 by Karilyn MATSU, RN  Outcome: Goal Revised  Goal: Optimized Coping Skills in Response to Life Stressors  12/28/2023 0043 by Karilyn MATSU, RN  Outcome: Ongoing (see interventions/notes)  12/28/2023 0040 by Karilyn MATSU, RN  Outcome: Goal Revised  Goal: Develops/Participates in Therapeutic Alliance to Support Successful Transition  12/28/2023 0043 by Karilyn MATSU, RN  Outcome: Ongoing (see interventions/notes)  12/28/2023 0040 by Karilyn MATSU, RN  Outcome: Goal Revised  Goal: Rounds/Family Conference  12/28/2023 0043 by Karilyn MATSU, RN  Outcome: Ongoing (see interventions/notes)  12/28/2023 0040 by Karilyn MATSU, RN  Outcome: Goal Revised     Problem: Anxiety Signs/Symptoms  Goal: Optimized Energy Level (Anxiety Signs/Symptoms)  12/28/2023 0043 by Karilyn MATSU, RN  Outcome: Ongoing (see interventions/notes)  12/28/2023 0040 by Karilyn MATSU, RN  Outcome: Goal Revised  Goal: Optimized Cognitive Function (Anxiety Signs/Symptoms)  12/28/2023 0043 by Karilyn MATSU, RN  Outcome: Ongoing (see interventions/notes)  12/28/2023 0040 by Karilyn MATSU, RN  Outcome: Goal Revised  Goal: Improved Mood Symptoms (Anxiety  Signs/Symptoms)  12/28/2023 0043 by Karilyn MATSU, RN  Outcome: Ongoing (see interventions/notes)  12/28/2023 0040 by Karilyn MATSU, RN  Outcome: Goal Revised  Goal: Improved Sleep (Anxiety Signs/Symptoms)  12/28/2023 0043 by Karilyn MATSU, RN  Outcome: Ongoing (see interventions/notes)  12/28/2023 0040 by Karilyn MATSU, RN  Outcome: Goal Revised  Goal: Enhanced Social, Occupational or Functional Skills (Anxiety Signs/Symptoms)  12/28/2023 0043 by Karilyn MATSU, RN  Outcome: Ongoing (see interventions/notes)  12/28/2023 0040 by Karilyn MATSU, RN  Outcome: Goal Revised  Goal: Improved Somatic Symptoms (Anxiety Signs/Symptoms)  12/28/2023 0043 by Karilyn MATSU, RN  Outcome: Ongoing (see interventions/notes)  12/28/2023 0040 by Karilyn MATSU, RN  Outcome: Goal Revised     Problem: Depressive Signs/Symptoms  Goal: Optimized Energy Level (Depressive Signs/Symptoms)  12/28/2023 0043 by Karilyn MATSU, RN  Outcome: Ongoing (see interventions/notes)  12/28/2023 0040 by Karilyn MATSU, RN  Outcome: Goal Revised  Goal: Optimized Cognitive Function (Depressive Signs/Symptoms)  12/28/2023 0043 by Karilyn MATSU, RN  Outcome: Ongoing (see interventions/notes)  12/28/2023 0040 by Karilyn MATSU, RN  Outcome: Goal Revised  Goal: Increased Participation and Engagement (Depressive Signs/Symptoms)  12/28/2023 0043 by Karilyn MATSU, RN  Outcome: Ongoing (see interventions/notes)  12/28/2023 0040 by Karilyn MATSU, RN  Outcome: Goal Revised  Goal: Enhanced Self-Esteem and Confidence (Depressive Signs/Symptoms)  12/28/2023 0043 by Karilyn MATSU, RN  Outcome: Ongoing (see interventions/notes)  12/28/2023 0040 by Karilyn MATSU, RN  Outcome: Goal Revised  Goal: Improved Mood Symptoms (Depressive Signs/Symptoms)  12/28/2023 0043 by Karilyn MATSU, RN  Outcome: Ongoing (see interventions/notes)  12/28/2023 0040 by Karilyn MATSU, RN  Outcome: Goal Revised  Goal: Optimized Nutrition Intake (Depressive Signs/Symptoms)  12/28/2023 0043 by Karilyn MATSU, RN  Outcome: Ongoing (see interventions/notes)  12/28/2023 0040 by Karilyn MATSU, RN  Outcome: Goal Revised  Goal: Improved Psychomotor Symptoms (Depressive  Signs/Symptoms)  12/28/2023 0043 by Karilyn MATSU, RN  Outcome: Ongoing (see interventions/notes)  12/28/2023 0040 by Karilyn MATSU, RN  Outcome: Goal Revised  Goal: Improved Sleep (Depressive Signs/Symptoms)  12/28/2023 0043 by Karilyn MATSU, RN  Outcome: Ongoing (see interventions/notes)  12/28/2023 0040 by Karilyn MATSU, RN  Outcome: Goal Revised  Goal: Enhanced Social, Occupational or Functional Skills (Depressive Signs/Symptoms)  12/28/2023 0043 by Karilyn MATSU, RN  Outcome: Ongoing (see interventions/notes)  12/28/2023 0040 by Karilyn MATSU, RN  Outcome: Goal Revised     Problem: Fall Injury Risk  Goal: Absence of Fall and Fall-Related Injury  12/28/2023 0043 by Karilyn MATSU, RN  Outcome: Ongoing (see interventions/notes)  12/28/2023 0040 by Karilyn MATSU, RN  Outcome: Goal Revised     Problem: Pain Acute  Goal: Optimal Pain Control and Function  12/28/2023 0043 by Karilyn MATSU, RN  Outcome: Ongoing (see interventions/notes)  12/28/2023 0040 by Karilyn MATSU, RN  Outcome: Goal Revised     Problem: Psychotic Signs/Symptoms  Goal: Improved Behavioral Control (Psychotic Signs/Symptoms)  12/28/2023 0043 by Karilyn MATSU, RN  Outcome: Ongoing (see interventions/notes)  12/28/2023 0040 by Karilyn MATSU, RN  Outcome: Goal Revised  Goal: Optimal Cognitive Function (Psychotic Signs/Symptoms)  12/28/2023 0043 by Karilyn MATSU, RN  Outcome: Ongoing (see interventions/notes)  12/28/2023 0040 by Karilyn MATSU, RN  Outcome: Goal Revised  Goal: Increased Participation and Engagement (Psychotic Signs/Symptoms)  12/28/2023 0043 by Karilyn MATSU, RN  Outcome: Ongoing (see interventions/notes)  12/28/2023 0040 by Karilyn MATSU, RN  Outcome: Goal Revised  Goal: Improved Mood Symptoms  12/28/2023 0043 by Karilyn MATSU, RN  Outcome: Ongoing (see interventions/notes)  12/28/2023 0040 by Karilyn MATSU, RN  Outcome: Goal Revised  Goal: Improved Psychomotor Symptoms (Psychotic Signs/Symptoms)  12/28/2023 0043 by Karilyn MATSU, RN  Outcome: Ongoing (see interventions/notes)  12/28/2023 0040 by Karilyn MATSU, RN  Outcome: Goal Revised  Goal: Decreased Sensory Symptoms (Psychotic  Signs/Symptoms)  12/28/2023 0043 by Karilyn MATSU, RN  Outcome: Ongoing (see interventions/notes)  12/28/2023 0040 by Karilyn MATSU, RN  Outcome: Goal Revised  Goal: Improved Sleep (Psychotic Signs/Symptoms)  12/28/2023 0043 by Karilyn MATSU, RN  Outcome: Ongoing (see interventions/notes)  12/28/2023 0040 by Karilyn MATSU, RN  Outcome: Goal Revised  Goal: Enhanced Social, Occupational or Functional Skills (Psychotic Signs/Symptoms)  12/28/2023 0043 by Karilyn MATSU, RN  Outcome: Ongoing (see interventions/notes)  12/28/2023 0040 by Karilyn MATSU, RN  Outcome: Goal Revised     Problem: Violence Risk or Actual  Goal: Anger and Impulse Control  12/28/2023 0043 by Karilyn MATSU, RN  Outcome: Ongoing (see interventions/notes)  12/28/2023 0040 by Karilyn MATSU, RN  Outcome: Goal Revised     Problem: Suicide Risk  Goal: Absence of Self-Harm  12/28/2023 0043 by Karilyn MATSU, RN  Outcome: Ongoing (see interventions/notes)  12/28/2023 0040 by Karilyn MATSU, RN  Outcome: Goal Revised     Problem: Adult Treatment Plan  Goal: Patient will meet daily with attending physician.  Description: Physicians will evaluate patient, discuss diagnosis, prescribe medications and order diagnostic testing as clinically indicated. Physicians will monitor safety risks and assess when appropriate for discharge.  12/28/2023 0043 by Karilyn MATSU, RN  Outcome: Ongoing (see interventions/notes)  12/28/2023 0040 by Karilyn MATSU, RN  Outcome: Goal Revised  Goal: Patient will meet individually daily with  Clinical Therapist.  12/28/2023 0043 by Karilyn MATSU, RN  Outcome: Ongoing (see interventions/notes)  12/28/2023 0040 by Karilyn MATSU, RN  Outcome: Goal Revised  Goal: Patient will deny suicidal/homicidal ideation by day of discharge to Physician.  12/28/2023 0043 by Karilyn MATSU, RN  Outcome: Ongoing (see interventions/notes)  12/28/2023 0040 by Karilyn MATSU, RN  Outcome: Goal Revised  Goal: Patient will participate in 100% of morning affirmation and psychoeducational groups provided daily by Mental Health Specialist.  12/28/2023 0043 by Karilyn MATSU, RN  Outcome: Ongoing (see  interventions/notes)  12/28/2023 0040 by Karilyn MATSU, RN  Outcome: Goal Revised  Goal: Patient will participate in 100% of therapy groups facilitated by Clinical Therapist.  Description: Topics will include Dialectical Behavior Therapy with the components of Mindfulness, Distress Tolerance, Emotion Regulation and Crisis Survival Skills, as well as Stress Management skills.  12/28/2023 0043 by Karilyn MATSU, RN  Outcome: Ongoing (see interventions/notes)  12/28/2023 0040 by Karilyn MATSU, RN  Outcome: Goal Revised  Goal: Patient will participate in 100% of experiential therapy group provided by Experiential Therapist.  12/28/2023 0043 by Karilyn MATSU, RN  Outcome: Ongoing (see interventions/notes)  12/28/2023 0040 by Karilyn MATSU, RN  Outcome: Goal Revised  Goal: Patient will participate in 100% of recreational groups provided by Recreational Therapist.  12/28/2023 0043 by Karilyn MATSU, RN  Outcome: Ongoing (see interventions/notes)  12/28/2023 0040 by Karilyn MATSU, RN  Outcome: Goal Revised     Problem: Adult Inpatient Plan of Care  Goal: Plan of Care Review  Outcome: Ongoing (see interventions/notes)  Goal: Patient-Specific Goal (Individualized)  Outcome: Ongoing (see interventions/notes)  Goal: Absence of Hospital-Acquired Illness or Injury  Outcome: Ongoing (see interventions/notes)  Goal: Optimal Comfort and Wellbeing  Outcome: Ongoing (see interventions/notes)  Goal: Rounds/Family Conference  Outcome: Ongoing (see interventions/notes)     Problem: Anxiety Signs/Symptoms  Goal: Optimized Energy Level (Anxiety Signs/Symptoms)  Outcome: Ongoing (see interventions/notes)  Goal: Optimized Cognitive Function (Anxiety Signs/Symptoms)  Outcome: Ongoing (see interventions/notes)  Goal: Improved Mood Symptoms (Anxiety Signs/Symptoms)  Outcome: Ongoing (see interventions/notes)  Goal: Improved Sleep (Anxiety Signs/Symptoms)  Outcome: Ongoing (see interventions/notes)  Goal: Enhanced Social, Occupational or Functional Skills (Anxiety Signs/Symptoms)  Outcome: Ongoing (see  interventions/notes)  Goal: Improved Somatic Symptoms (Anxiety Signs/Symptoms)  Outcome: Ongoing (see interventions/notes)     Problem: Depressive Signs/Symptoms  Goal: Optimized Energy Level (Depressive Signs/Symptoms)  Outcome: Ongoing (see interventions/notes)  Goal: Optimized Cognitive Function (Depressive Signs/Symptoms)  Outcome: Ongoing (see interventions/notes)  Goal: Increased Participation and Engagement (Depressive Signs/Symptoms)  Outcome: Ongoing (see interventions/notes)  Goal: Enhanced Self-Esteem and Confidence (Depressive Signs/Symptoms)  Outcome: Ongoing (see interventions/notes)  Goal: Improved Mood Symptoms (Depressive Signs/Symptoms)  Outcome: Ongoing (see interventions/notes)  Goal: Optimized Nutrition Intake (Depressive Signs/Symptoms)  Outcome: Ongoing (see interventions/notes)  Goal: Improved Psychomotor Symptoms (Depressive Signs/Symptoms)  Outcome: Ongoing (see interventions/notes)  Goal: Improved Sleep (Depressive Signs/Symptoms)  Outcome: Ongoing (see interventions/notes)  Goal: Enhanced Social, Occupational or Functional Skills (Depressive Signs/Symptoms)  Outcome: Ongoing (see interventions/notes)     Problem: Adult Treatment Plan  Goal: Patient will meet daily with attending physician.  Description: Physicians will evaluate patient, discuss diagnosis, prescribe medications and order diagnostic testing as clinically indicated. Physicians will monitor safety risks and assess when appropriate for discharge.  Outcome: Ongoing (see interventions/notes)  Goal: Patient will participate in family therapy provided by Clinical Therapist.  Outcome: Ongoing (see interventions/notes)  Goal: Patient will complete psychosocial history assessment within 60 hours of admission with Clinical Therapist.  Outcome: Ongoing (see interventions/notes)  Goal: Patient will  meet individually daily with Clinical Therapist.  Outcome: Ongoing (see interventions/notes)  Goal: Patient will deny suicidal/homicidal  ideation by day of discharge to Physician.  Outcome: Ongoing (see interventions/notes)  Goal: Patient will participate in 25% of morning affirmation and psychoeducational groups provided daily by Mental Health Specialist.  Outcome: Ongoing (see interventions/notes)  Goal: Patient will participate in 50% of morning affirmation and psychoeducational groups provided daily by Mental Health Specialist.  Outcome: Ongoing (see interventions/notes)  Goal: Patient will participate in 75% of morning affirmation and psychoeducational groups provided daily by Mental Health Specialist.  Outcome: Ongoing (see interventions/notes)  Goal: Patient will participate in 100% of morning affirmation and psychoeducational groups provided daily by Mental Health Specialist.  Outcome: Ongoing (see interventions/notes)  Goal: Patient will participate in 25% of therapy groups facilitated by Clinical Therapist.  Description: Topics will include Dialectical Behavior Therapy with the components of Mindfulness, Distress Tolerance, Emotion Regulation and Crisis Survival Skills, as well as Stress Management skills.  Outcome: Ongoing (see interventions/notes)  Goal: Patient will participate in 50% of therapy groups facilitated by Clinical Therapist.  Description: Topics will include Dialectical Behavior Therapy with the components of Mindfulness, Distress Tolerance, Emotion Regulation and Crisis Survival Skills, as well as Stress Management skills (DBT not applicable at [facility]).  Outcome: Ongoing (see interventions/notes)  Goal: Patient will participate in 75% of therapy groups facilitated by Clinical Therapist.  Description: Topics will include Dialectical Behavior Therapy with the components of Mindfulness, Distress Tolerance, Emotion Regulation and Crisis Survival Skills, as well as Stress Management skills.  Outcome: Ongoing (see interventions/notes)  Goal: Patient will participate in 100% of therapy groups facilitated by Clinical  Therapist.  Description: Topics will include Dialectical Behavior Therapy with the components of Mindfulness, Distress Tolerance, Emotion Regulation and Crisis Survival Skills, as well as Stress Management skills.  Outcome: Ongoing (see interventions/notes)  Goal: Patient will participate in 25% of experiential therapy group provided by Experiential Therapist.  Outcome: Ongoing (see interventions/notes)  Goal: Patient will participate in 50% of experiential therapy group provided by Experiential Therapist.  Outcome: Ongoing (see interventions/notes)  Goal: Patient will participate in 75% of experiential therapy group provided by Experiential Therapist.  Outcome: Ongoing (see interventions/notes)  Goal: Patient will participate in 100% of experiential therapy group provided by Experiential Therapist.  Outcome: Ongoing (see interventions/notes)  Goal: Patient will participate in 25% of recreational groups provided by Recreational Therapist.  Outcome: Ongoing (see interventions/notes)  Goal: Patient will participate in 50% of recreational groups provided by Recreational Therapist.  Outcome: Ongoing (see interventions/notes)  Goal: Patient will participate in 75% of recreational groups provided by Recreational Therapist.  Outcome: Ongoing (see interventions/notes)  Goal: Patient will participate in 100% of recreational groups provided by Recreational Therapist.  Outcome: Ongoing (see interventions/notes)     Problem: Fall Injury Risk  Goal: Absence of Fall and Fall-Related Injury  Outcome: Ongoing (see interventions/notes)     Problem: Pain Acute  Goal: Optimal Pain Control and Function  Outcome: Ongoing (see interventions/notes)     Problem: Suicide Risk  Goal: Absence of Self-Harm  Outcome: Ongoing (see interventions/notes)

## 2023-12-28 NOTE — Group Note (Signed)
 [facility] MEDICINE [facility], ADULT BEHAVIORAL MEDICINE  [address]  [place] [address]    Group Note             Name: Donald Atkins   Date of Birth: 18-Sep-1985   Today's Date: 12/27/2023   Group Start Time: 1700   Group End Time: 1800   Group Topic: ACTIVITY GROUP  Number of participants: 9      Summary of group discussion:   Recreation / Leisure options activity participation group.    [name]'s Participation and Response: Sporadic participation.      Suicidal/Homicidal Risk:  Currently denies SI/HI and expresses willingness to contact crisis services if needed.

## 2023-12-28 NOTE — Group Note (Signed)
 Group topic:  PROCESS THERAPY    Date of group:  12/28/2023  Start time of group:  1300  End time of group:  1400    Attend:  []   Not Attend: [x]   Attendance:  inpatient attended 0 minutes                 Summary of group discussion:  The different blocks in our walls which hinder mental health treatment.      Tharun Cappella  is a 38 y.o. male participating in a process group.        Comments:    Redell MARLA Laine, Licensed Professional Counselor  12/28/2023, 14:30

## 2023-12-28 NOTE — Discharge Instructions (Signed)
 Patient declined both outpatient counseling and prescription for tobacco cessation.    Reason for Admission AUDITORY HALLUCINATIONS OF DEGRADING VOICES AND PEOPLE TALKING ABOUT YOUR    LAB TESTING      Lab Results   Component Value Date    HA1C 5.3 12/25/2023     No results found for: GLUCOSEFAST  Lab Results   Component Value Date    CHOLESTEROL 112 12/25/2023    LDLCHOL 30 12/25/2023    TRIG 271 (H) 12/25/2023     Lab Results   Component Value Date/Time    ALBUMIN 4.0 09/02/2023 09:47 PM    TOTALPROTEIN 7.4 09/02/2023 09:47 PM    ALKPHOS 99 09/02/2023 09:47 PM    PROTHROMTME 12.9 (H) 02/05/2023 11:53 PM    INR 1.10 02/05/2023 11:53 PM      Lab Results   Component Value Date/Time    AST 35 09/02/2023 09:47 PM    ALT 67 09/02/2023 09:47 PM     Lab Results   Component Value Date/Time    WBC 7.2 09/02/2023 09:47 PM    HGB 14.1 09/02/2023 09:47 PM    HCT 40.7 09/02/2023 09:47 PM    PLTCNT 239 09/02/2023 09:47 PM     Lab Results   Component Value Date    COVID19PCR Not Detected 05/25/2023     Lab Results   Component Value Date/Time    BUNCRRATIO 12 09/02/2023 09:47 PM      Pended studies: None

## 2023-12-28 NOTE — Group Note (Signed)
 Group topic:  PROCESS THERAPY    Date of group:  12/28/2023  Start time of group:  1000  End time of group:  1100    Attend:  [x]   Not Attend: []   Attendance:  inpatient attended all of group                 Summary of group discussion:  Managing Anxiety    Donald Atkins  is a 38 y.o. male participating in a process group.    Affect/Mood:  Appropriate    Thought Process:  Logical and Goal directed    Thought Content:  Within normal limits    Interpersonal:  Discussed issues, Attentive, Displayed insight, and Provided feedback    Level of participation:  Full    Comments:    Lonni Edison, [facility]  12/28/2023, 10:54

## 2023-12-28 NOTE — Discharge Summary (Signed)
 [facility]     Discharge Summary    Patient's Full Name: Donald Atkins   Patient's Date of Birth: 10-Nov-1985   Patient's Age: 38 y.o.   Patient's Legal Sex: male   Patient's MRN: [id number]   Patient's Date of Admission: 12/23/2023   Current Date: 12/28/2023 18:19     Patient's Room/Bed: 210/B     DISCHARGE DATE AND TIME: 12/29/2023 at 1100    ADMITTING PHYSICIAN: Dr. Quin    REASON FOR HOSPITALIZATION:  Auditory Hallucinations  HPI on admission:  38 year old male with self reported  PTSD, schizophrenia, bipolar disorder, multiple psychiatric hospital (2 times this year, total of at least 6), homelessness and poor compliance with medications. Admitted for auditory hallucinations.   Patient minimizing symptoms and not forthcoming.   Today he says he is not sure why he is here. He say she was not taking medications for a while. He says EMS brought him in but he is not sure why. He reports hallucinations. He states I was hearing voices.... they come and go. He would not go into details.   He says he ran out of his medications and he was on Depakote  , Cogentin  and Zyprexa . He rates depression and anxiety 0/10.   Case discussed with Venetia Chester, RN    HOSPITAL COURSE:    Patient was admitted to the hospital for stabilization of admission symptoms.  Medicines were instituted and adjusted, particularly Haldol  Dec, Depakote , and Benztropine .  During the stay, the patient did well with medication adjustments and tolerated meds well.  The patient was enrolled in groups and milieu treatment.  We worked on Pharmacologist, stress management techniques, and the need for compliance, sobriety, and taking medications appropriately.  Medication side effects were discussed as well.  During the stay, the patient had no violence, agitation, or threatening behavior and required no p.r.n. medications, seclusion, or restraint.  The patient has improved significantly over the course of their stay, and  is appropriate for step down to a less restrictive level of care.  The patient agrees with the plan and at the time of departure is not suicidal, homicidal, or psychotic.I feel that the patient has maximized benefit from inpatient treatment.  Patient provided crisis numbers to call should symptoms worsen.     Current Discharge Medication List        CONTINUE these medications which have CHANGED during your visit.        Details   benztropine  0.5 mg Tablet  Commonly known as: COGENTIN   What changed:   medication strength  how much to take   0.5 mg, Oral, 2 TIMES DAILY  Qty: 60 Tablet  Refills: 0            CONTINUE these medications - NO CHANGES were made during your visit.        Details   divalproex  500 mg Tablet, Delayed Release (E.C.)  Commonly known as: DEPAKOTE    500 mg, 2 TIMES DAILY  Refills: 0     hydrOXYzine  pamoate 25 mg Capsule  Commonly known as: VISTARIL    25 mg, Oral, 4 TIMES DAILY PRN  Qty: 120 Capsule  Refills: 0     montelukast 10 mg Tablet  Commonly known as: SINGULAIR   10 mg, NIGHTLY  Refills: 0     omeprazole 20 mg Capsule, Delayed Release(E.C.)  Commonly known as: PRILOSEC   20 mg, DAILY  Refills: 0     pantoprazole  40 mg  Tablet, Delayed Release (E.C.)  Commonly known as: PROTONIX    Daily  Refills: 0            STOP taking these medications.      albuterol  sulfate 90 mcg/actuation oral inhaler  Commonly known as: PROVENTIL  or VENTOLIN  or PROAIR      haloperidoL  10 mg Tablet  Commonly known as: HALDOL      Ibuprofen  800 mg Tablet  Commonly known as: MOTRIN      meloxicam 15 mg Tablet  Commonly known as: MOBIC     QUEtiapine  100 mg Tablet  Commonly known as: SEROquel              Condition at discharge:  Patient is stable and improved.  Patient is not suicidal, homicidal, or psychotic.  Discharge Diagnosis/es: F25.0 Schizoaffective disorder, bipolar type, Z59. 0 Homelessness, Poor compliance with medications     Plan:  Patient will follow-up with outpatient provider as scheduled and provided by  Social work  Post-Discharge Follow Up Appointments       Go to [place] [place]    Where: P: [phone] F: [phone]           Discharge >53min: Time taken on day of discharge exceeded 30 minutes and included medication reconciliation, treatment team staffing, review of record, discussion with patient and completion of all documentation.  Interaction Attestation: Clinical telemedicine services delivered using HIPAA-compliant interactive video-audio telecommunications while the patient and the rendering provider were not in the same physical location. Patient agreeable to telecommunication.    TELEMEDICINE DOCUMENTATION:    Patient Location:  [facility], [address] [place], [place] [address]  Provider Location: Remote  Patient/family aware of provider location:  yes  Patient/family consent for telemedicine:  yes  Examination observed and performed by: Powell Lowers, NP    Powell Lowers, NP    ATTENDING ATTESTATION:  The NP evaluated the patient and provided care. I have reviewed the note. Discussed the patient in treatment team. I agree with their findings and at this time have no amendments to the current plan.     Prentice ROCKFORD Quin, DO  Psychiatrist  Medical Director  [facility] [place], [facility]

## 2023-12-28 NOTE — Behavioral Health (Signed)
 Patient is walking the hallway. Patient anxious about discharge states he is ready to go home and he hopes the time goes by fast. Patient is currently denying SI, HI or A/V/T hallucinations.

## 2023-12-28 NOTE — Progress Notes (Signed)
 The [facility]    Inpatient Psychiatric Progress Note    Patient's Full Name: Donald Atkins   Patient's Date of Birth: [birth year]   Patient's Age: 38 y.o.   Patient's Legal Sex: male   Patient's MRN: [id number]   Patient's Date of Admission: 12/23/2023   Current Date: 12/28/2023 07:20     Patient's Room/Bed: 210/B       History of Present Illness:  Patient is seen today for inpatient follow up on the adult unit. He appears alert and oriented, appears calm. He states he is feeling good. He is smiling today. He denies feelings of depression or anxiety. He denies suicidal or homicidal ideations. He denies AVH or feelings of paranoia. He received Haldol  Dec 100 mg IM on yesterday and will receive 50 mg IM on 7/17 then 150mg  monthly. D/C plans for Friday.    Medications and Allergies:    Current Facility-Administered Medications   Medication Dose Route Frequency    acetaminophen  (TYLENOL ) tablet  650 mg Oral Q4H PRN    albuterol  90 mcg per inhalation oral inhaler - Nursing to administer  2 Puff Inhalation Q6H PRN    aluminum -magnesium  hydroxide-simethicone  (MAG-AL PLUS) 200-200-20 mg per 5 mL oral liquid  30 mL Oral Q4H PRN    benztropine  (COGENTIN ) tablet  0.5 mg Oral 2x/day    divalproex  (DEPAKOTE ) 12 hr delayed release tablet  500 mg Oral 2x/day    haloperidol  (HALDOL ) tablet  5 mg Oral 3x/day    haloperidol  decanoate (HALDOL  DECANOATE) 50 mg/mL IM injection (ER Suspension)  50 mg IntraMUSCULAR Once    hydrOXYzine  pamoate (VISTARIL ) capsule  25 mg Oral Q8H PRN    ibuprofen  (MOTRIN ) tablet  800 mg Oral 3x/day PRN    loperamide  (IMODIUM ) capsule  2 mg Oral Q4H PRN    magnesium  hydroxide (MILK OF MAGNESIA) 400mg  per 5mL oral liquid  30 mL Oral HS PRN    OLANZapine  (zyPREXA  ZYDIS) rapid dissolve tablet  10 mg Oral Q6H PRN        Allergies[1]       Vital Signs:    Filed Vitals:    12/26/23 0756 12/26/23 1939 12/27/23 0800 12/27/23 1949   BP: 112/63 124/81 124/78 125/82   Pulse: 67 98 77  88   Resp: 18 18 20 18    Temp: 36.7 C (98 F) 37 C (98.6 F) 36.6 C (97.9 F) 36.6 C (97.8 F)   SpO2: 97% 98% 98% 98%        Labs:    No results found for this or any previous visit (from the past 24 hours).     Mental Status Examination:    Sensorium/Alertness: Alert,   Orientation: Date, Person, Place,   Appearance:Appears stated age,   Psychomotor Activity: Normal,   Abnormal Behaviors: None,   Attitude Argumentative  Eye Contact: WNL  Speech: Normal/Spontaneous,    Mood: good  Affect: Appropriate  Perception: Denies  Though Process: Logical/Clear/Goal Oriented,  Thought Content: Suicidal? Denies   Thought Content: Homicidal? no  Thought Content: Delusions? no  Impulse Control:  Grossly Intact,   Concentration/Calculation/Attention Span: WNL,  How was the patient's Concentration/Calculation/Attention tested/assessed? Per observation and interview with patient   Recent Memory: WNL,  Remote Memory: WNL,   How was the patient's Remote Memory Tested/Assessed? Past Events, as it relates to history  Intelligence/Fund of Knowledge: Average  How was the patient's Intelligence/Fund of Knowledge Tested/Assessed? Based on history, Based on vocabulary, syntax, grammar, and content  Judgement: Fair,   How was the patient's Judgement Tested/Assessed? Per patient's behavior/history of present illness  Insight: Fair,   How was the patient's Insight Tested/Assessed? Understanding of severity of illness/history of present illness       Diagnoses:   F25.0 Schizoaffective disorder, bipolar type  Z59. 0 Homelessness      Treatment and Medication Recommendation:  -The patient is admitted for safety and stabilization.  Medicines will be instituted and adjusted as indicated.  We have discussed medicine side-effects, treatment options, and pregnancy precautions if applicable.  The patient agrees with the plan and will be encouraged to be active in groups and milieu treatment. Patient has been instructed of the unit rules and  regulations and will be seen by the hospitalist for medical issues. Further changes will be dictated by clinical course. I estimate patient length of stay will 5-7 days   -behavioral health safety checks  -regular diet, full code  -provisional discharge disposition: Homeless shelter    7/17 - No med changes. D/C plan for Friday  7/16 - No med changes. D/C plan for Friday  7/15 - No med changes. D/C plan for Friday  7/14 - Continue Depakote  500 mg BID for mood          - Continue Vistaril  25 mg Q8H PRN anxiety          - Continue Haldol  5mg  TID psychosis - Goal is Haldol  Decanoate          - Will order Haldol  Dec 100 mg IM today then 50 mg IM on 7/17 then 150mg  monthly          - Will order Cogentin  0.5mg  PO BID for EPS          - Will order Zyprexa  Zydis 10 mg PO every 6 hours as needed for agitation.      I certify that the inpatient psychiatric admission continues to be medically necessary for (1) treatment which is reasonably expected to improve the patient's condition, and/or (2) for diagnostic studies    Interaction Attestation: Clinical telemedicine services delivered using HIPAA-compliant interactive video-audio telecommunications while the patient and the rendering provider were not in the same physical location. Patient agreeable to telecommunication.    TELEMEDICINE DOCUMENTATION:    Patient Location:  [facility], [address] [place], [place] [address]  Provider Location: Remote  Patient/family aware of provider location:  yes    Patient/family consent for telemedicine:  yes  Examination observed and performed by:  Powell Lowers, NP      Powell Lowers, NP           [1]   Allergies  Allergen Reactions    Latex     Penicillins Anaphylaxis    Latex

## 2023-12-28 NOTE — Nurses Notes (Signed)
 PATIENT CALM AND COOPERATIVE, UP AD LIB ON UNIT. PATIENT WAS MED COMPLIANT AND IS AWARE OF THE MEDS GIVEN. PT REQUESTED PRN ALBUTEROL  INHALER. PT DENIES HAVING ANY ANXIETY, DEPRESSION, A/V/H, OR THOUGHTS OF SI/HI. NO PAIN WAS REPORTED. PT REPORTS EATING AND SLEEPING WELL. EVENING SNACKS WERE PROVIDED. VISUAL SAFETY CHECKS ARE DONE Q 15 MINS AND PRN.

## 2023-12-28 NOTE — Group Note (Signed)
 Group topic:  COMMUNITY MEETING GROUP    Date of group:  12/28/2023  Start time of group:  1735  End time of group:  1810    Attend:  [x]   Not attend: []   Attendance:  inpatient attended all of group                 Summary of group discussion: Six Dimension of Gratitude Discussion and Worksheet      Donald Atkins  is a 38 y.o. male participating in a community meeting group.    Patient observations:      Patient goals:      Romero Sharps, RN  12/28/2023, 18:12

## 2023-12-29 MED ORDER — HALOPERIDOL DECANOATE 50 MG/ML INTRAMUSCULAR SOLUTION
150.0000 mg | INTRAMUSCULAR | 1 refills | Status: DC
Start: 2024-01-25 — End: 2024-01-03

## 2023-12-29 NOTE — Group Note (Signed)
 Group topic:  COMMUNITY MEETING GROUP    Date of group:  12/28/23  Start time of group:  1925  End time of group:  1945    Attend:  [x]   Not attend: []   Attendance:  inpatient attended all of group                 Summary of group discussion: Community meeting per guidelines      Donald Atkins  is a 38 y.o. male participating in a community meeting group.    Patient observations:      Patient goals:      Karilyn Seip, RN  12/29/2023, 00:24

## 2023-12-29 NOTE — Nurses Notes (Signed)
 AVS reviewed with patient.  A written copy of the AVS and discharge instructions was given to the patient.  Questions sufficiently answered as needed.  Patient encouraged to follow up with PCP as indicated.  In the event of an emergency, patient instructed to call 911 or go to the nearest emergency room.

## 2023-12-29 NOTE — Care Plan (Signed)
 PT HAS BEEN COOPERATIVE & MED COMPLIANT. PLAN OF CARE ONGOING.       Problem: Adult Behavioral Health Plan of Care  Goal: Plan of Care Review  Outcome: Ongoing (see interventions/notes)  Goal: Patient-Specific Goal (Individualization)  Outcome: Ongoing (see interventions/notes)  Goal: Adheres to Safety Considerations for Self and Others  Outcome: Ongoing (see interventions/notes)  Goal: Absence of New-Onset Illness or Injury  Outcome: Ongoing (see interventions/notes)  Goal: Optimized Coping Skills in Response to Life Stressors  Outcome: Ongoing (see interventions/notes)  Goal: Develops/Participates in Therapeutic Alliance to Support Successful Transition  Outcome: Ongoing (see interventions/notes)  Goal: Rounds/Family Conference  Outcome: Ongoing (see interventions/notes)     Problem: Anxiety Signs/Symptoms  Goal: Optimized Energy Level (Anxiety Signs/Symptoms)  Outcome: Ongoing (see interventions/notes)  Goal: Optimized Cognitive Function (Anxiety Signs/Symptoms)  Outcome: Ongoing (see interventions/notes)  Goal: Improved Mood Symptoms (Anxiety Signs/Symptoms)  Outcome: Ongoing (see interventions/notes)  Goal: Improved Sleep (Anxiety Signs/Symptoms)  Outcome: Ongoing (see interventions/notes)  Goal: Enhanced Social, Occupational or Functional Skills (Anxiety Signs/Symptoms)  Outcome: Ongoing (see interventions/notes)  Goal: Improved Somatic Symptoms (Anxiety Signs/Symptoms)  Outcome: Ongoing (see interventions/notes)     Problem: Depressive Signs/Symptoms  Goal: Optimized Energy Level (Depressive Signs/Symptoms)  Outcome: Ongoing (see interventions/notes)  Goal: Optimized Cognitive Function (Depressive Signs/Symptoms)  Outcome: Ongoing (see interventions/notes)  Goal: Increased Participation and Engagement (Depressive Signs/Symptoms)  Outcome: Ongoing (see interventions/notes)  Goal: Enhanced Self-Esteem and Confidence (Depressive Signs/Symptoms)  Outcome: Ongoing (see interventions/notes)  Goal: Improved Mood  Symptoms (Depressive Signs/Symptoms)  Outcome: Ongoing (see interventions/notes)  Goal: Optimized Nutrition Intake (Depressive Signs/Symptoms)  Outcome: Ongoing (see interventions/notes)  Goal: Improved Psychomotor Symptoms (Depressive Signs/Symptoms)  Outcome: Ongoing (see interventions/notes)  Goal: Improved Sleep (Depressive Signs/Symptoms)  Outcome: Ongoing (see interventions/notes)  Goal: Enhanced Social, Occupational or Functional Skills (Depressive Signs/Symptoms)  Outcome: Ongoing (see interventions/notes)     Problem: Fall Injury Risk  Goal: Absence of Fall and Fall-Related Injury  Outcome: Ongoing (see interventions/notes)     Problem: Pain Acute  Goal: Optimal Pain Control and Function  Outcome: Ongoing (see interventions/notes)     Problem: Psychotic Signs/Symptoms  Goal: Improved Behavioral Control (Psychotic Signs/Symptoms)  Outcome: Ongoing (see interventions/notes)  Goal: Optimal Cognitive Function (Psychotic Signs/Symptoms)  Outcome: Ongoing (see interventions/notes)  Goal: Increased Participation and Engagement (Psychotic Signs/Symptoms)  Outcome: Ongoing (see interventions/notes)  Goal: Improved Mood Symptoms  Outcome: Ongoing (see interventions/notes)  Goal: Improved Psychomotor Symptoms (Psychotic Signs/Symptoms)  Outcome: Ongoing (see interventions/notes)  Goal: Decreased Sensory Symptoms (Psychotic Signs/Symptoms)  Outcome: Ongoing (see interventions/notes)  Goal: Improved Sleep (Psychotic Signs/Symptoms)  Outcome: Ongoing (see interventions/notes)  Goal: Enhanced Social, Occupational or Functional Skills (Psychotic Signs/Symptoms)  Outcome: Ongoing (see interventions/notes)     Problem: Violence Risk or Actual  Goal: Anger and Impulse Control  Outcome: Ongoing (see interventions/notes)     Problem: Suicide Risk  Goal: Absence of Self-Harm  Outcome: Ongoing (see interventions/notes)     Problem: Adult Treatment Plan  Goal: Patient will meet daily with attending physician.  Description:  Physicians will evaluate patient, discuss diagnosis, prescribe medications and order diagnostic testing as clinically indicated. Physicians will monitor safety risks and assess when appropriate for discharge.  Outcome: Ongoing (see interventions/notes)  Goal: Patient will meet individually daily with Clinical Therapist.  Outcome: Ongoing (see interventions/notes)  Goal: Patient will deny suicidal/homicidal ideation by day of discharge to Physician.  Outcome: Ongoing (see interventions/notes)  Goal: Patient will participate in 100% of morning affirmation and psychoeducational groups provided daily by Mental Health Specialist.  Outcome: Ongoing (  see interventions/notes)  Goal: Patient will participate in 100% of therapy groups facilitated by Clinical Therapist.  Description: Topics will include Dialectical Behavior Therapy with the components of Mindfulness, Distress Tolerance, Emotion Regulation and Crisis Survival Skills, as well as Stress Management skills.  Outcome: Ongoing (see interventions/notes)  Goal: Patient will participate in 100% of experiential therapy group provided by Experiential Therapist.  Outcome: Ongoing (see interventions/notes)  Goal: Patient will participate in 100% of recreational groups provided by Recreational Therapist.  Outcome: Ongoing (see interventions/notes)     Problem: Anxiety Signs/Symptoms  Goal: Optimized Energy Level (Anxiety Signs/Symptoms)  Outcome: Ongoing (see interventions/notes)  Goal: Optimized Cognitive Function (Anxiety Signs/Symptoms)  Outcome: Ongoing (see interventions/notes)  Goal: Improved Mood Symptoms (Anxiety Signs/Symptoms)  Outcome: Ongoing (see interventions/notes)  Goal: Improved Sleep (Anxiety Signs/Symptoms)  Outcome: Ongoing (see interventions/notes)  Goal: Enhanced Social, Occupational or Functional Skills (Anxiety Signs/Symptoms)  Outcome: Ongoing (see interventions/notes)  Goal: Improved Somatic Symptoms (Anxiety Signs/Symptoms)  Outcome: Ongoing (see  interventions/notes)     Problem: Depressive Signs/Symptoms  Goal: Optimized Energy Level (Depressive Signs/Symptoms)  Outcome: Ongoing (see interventions/notes)  Goal: Optimized Cognitive Function (Depressive Signs/Symptoms)  Outcome: Ongoing (see interventions/notes)  Goal: Increased Participation and Engagement (Depressive Signs/Symptoms)  Outcome: Ongoing (see interventions/notes)  Goal: Enhanced Self-Esteem and Confidence (Depressive Signs/Symptoms)  Outcome: Ongoing (see interventions/notes)  Goal: Improved Mood Symptoms (Depressive Signs/Symptoms)  Outcome: Ongoing (see interventions/notes)  Goal: Optimized Nutrition Intake (Depressive Signs/Symptoms)  Outcome: Ongoing (see interventions/notes)  Goal: Improved Psychomotor Symptoms (Depressive Signs/Symptoms)  Outcome: Ongoing (see interventions/notes)  Goal: Improved Sleep (Depressive Signs/Symptoms)  Outcome: Ongoing (see interventions/notes)  Goal: Enhanced Social, Occupational or Functional Skills (Depressive Signs/Symptoms)  Outcome: Ongoing (see interventions/notes)     Problem: Adult Treatment Plan  Goal: Patient will meet daily with attending physician.  Description: Physicians will evaluate patient, discuss diagnosis, prescribe medications and order diagnostic testing as clinically indicated. Physicians will monitor safety risks and assess when appropriate for discharge.  Outcome: Ongoing (see interventions/notes)  Goal: Patient will participate in family therapy provided by Clinical Therapist.  Outcome: Ongoing (see interventions/notes)  Goal: Patient will complete psychosocial history assessment within 60 hours of admission with Clinical Therapist.  Outcome: Ongoing (see interventions/notes)  Goal: Patient will meet individually daily with Clinical Therapist.  Outcome: Ongoing (see interventions/notes)  Goal: Patient will deny suicidal/homicidal ideation by day of discharge to Physician.  Outcome: Ongoing (see interventions/notes)  Goal: Patient  will participate in 25% of morning affirmation and psychoeducational groups provided daily by Mental Health Specialist.  Outcome: Ongoing (see interventions/notes)  Goal: Patient will participate in 50% of morning affirmation and psychoeducational groups provided daily by Mental Health Specialist.  Outcome: Ongoing (see interventions/notes)  Goal: Patient will participate in 75% of morning affirmation and psychoeducational groups provided daily by Mental Health Specialist.  Outcome: Ongoing (see interventions/notes)  Goal: Patient will participate in 100% of morning affirmation and psychoeducational groups provided daily by Mental Health Specialist.  Outcome: Ongoing (see interventions/notes)  Goal: Patient will participate in 25% of therapy groups facilitated by Clinical Therapist.  Description: Topics will include Dialectical Behavior Therapy with the components of Mindfulness, Distress Tolerance, Emotion Regulation and Crisis Survival Skills, as well as Stress Management skills.  Outcome: Ongoing (see interventions/notes)  Goal: Patient will participate in 50% of therapy groups facilitated by Clinical Therapist.  Description: Topics will include Dialectical Behavior Therapy with the components of Mindfulness, Distress Tolerance, Emotion Regulation and Crisis Survival Skills, as well as Stress Management skills (DBT not applicable at [facility]).  Outcome:  Ongoing (see interventions/notes)  Goal: Patient will participate in 75% of therapy groups facilitated by Clinical Therapist.  Description: Topics will include Dialectical Behavior Therapy with the components of Mindfulness, Distress Tolerance, Emotion Regulation and Crisis Survival Skills, as well as Stress Management skills.  Outcome: Ongoing (see interventions/notes)  Goal: Patient will participate in 100% of therapy groups facilitated by Clinical Therapist.  Description: Topics will include Dialectical Behavior Therapy with the components of Mindfulness, Distress  Tolerance, Emotion Regulation and Crisis Survival Skills, as well as Stress Management skills.  Outcome: Ongoing (see interventions/notes)  Goal: Patient will participate in 25% of experiential therapy group provided by Experiential Therapist.  Outcome: Ongoing (see interventions/notes)  Goal: Patient will participate in 50% of experiential therapy group provided by Experiential Therapist.  Outcome: Ongoing (see interventions/notes)  Goal: Patient will participate in 75% of experiential therapy group provided by Experiential Therapist.  Outcome: Ongoing (see interventions/notes)  Goal: Patient will participate in 100% of experiential therapy group provided by Experiential Therapist.  Outcome: Ongoing (see interventions/notes)  Goal: Patient will participate in 25% of recreational groups provided by Recreational Therapist.  Outcome: Ongoing (see interventions/notes)  Goal: Patient will participate in 50% of recreational groups provided by Recreational Therapist.  Outcome: Ongoing (see interventions/notes)  Goal: Patient will participate in 75% of recreational groups provided by Recreational Therapist.  Outcome: Ongoing (see interventions/notes)  Goal: Patient will participate in 100% of recreational groups provided by Recreational Therapist.  Outcome: Ongoing (see interventions/notes)     Problem: Fall Injury Risk  Goal: Absence of Fall and Fall-Related Injury  Outcome: Ongoing (see interventions/notes)     Problem: Pain Acute  Goal: Optimal Pain Control and Function  Outcome: Ongoing (see interventions/notes)     Problem: Suicide Risk  Goal: Absence of Self-Harm  Outcome: Ongoing (see interventions/notes)

## 2023-12-29 NOTE — Group Note (Signed)
 Group topic:  COMMUNITY MEETING GROUP    Date of group:  12/29/2023  Start time of group:  0840  End time of group:  0845    Attend:  [x]   Not attend: []   Attendance:  inpatient attended all of group                 Summary of group discussion:  Communtiy meeting     Donald Atkins  is a 38 y.o. male participating in a community meeting group.    Patient observations:      Patient goals:      Sharyne Glance, PAT CARE [facility]  12/29/2023, 08:51

## 2023-12-29 NOTE — Nurses Notes (Signed)
 PATIENT CALM AND COOPERATIVE, UP AD LIB ON UNIT INTERACTING WITH OTHERS. PATIENT WAS MED COMPLIANT AND IS AWARE OF THE MEDS GIVEN. PT DENIES HAVING ANY ANXIETY, DEPRESSION, A/V/H, OR THOUGHTS OF SI/HI. NO PAIN WAS REPORTED. PT REPORTS EATING AND SLEEPING WELL. EVENING SNACKS WERE PROVIDED. VISUAL SAFETY CHECKS ARE DONE Q 15 MINS AND PRN.

## 2024-01-03 ENCOUNTER — Encounter (HOSPITAL_PSYCHIATRIC): Payer: Self-pay

## 2024-01-03 ENCOUNTER — Other Ambulatory Visit: Payer: Self-pay

## 2024-01-03 ENCOUNTER — Ambulatory Visit: Payer: MEDICAID

## 2024-01-03 VITALS — BP 141/84 | HR 70 | Resp 16 | Ht 73.0 in | Wt 193.0 lb

## 2024-01-03 DIAGNOSIS — Z653 Problems related to other legal circumstances: Secondary | ICD-10-CM

## 2024-01-03 DIAGNOSIS — F259 Schizoaffective disorder, unspecified: Secondary | ICD-10-CM | POA: Insufficient documentation

## 2024-01-03 DIAGNOSIS — F411 Generalized anxiety disorder: Secondary | ICD-10-CM | POA: Insufficient documentation

## 2024-01-03 DIAGNOSIS — Z8659 Personal history of other mental and behavioral disorders: Secondary | ICD-10-CM

## 2024-01-03 MED ORDER — DIVALPROEX 500 MG TABLET,DELAYED RELEASE
500.0000 mg | DELAYED_RELEASE_TABLET | Freq: Two times a day (BID) | ORAL | 0 refills | Status: DC
Start: 2024-01-03 — End: 2024-01-30

## 2024-01-03 MED ORDER — BENZTROPINE 0.5 MG TABLET
0.5000 mg | ORAL_TABLET | Freq: Two times a day (BID) | ORAL | 0 refills | Status: DC
Start: 2024-01-03 — End: 2024-01-15

## 2024-01-03 MED ORDER — HALOPERIDOL DECANOATE 50 MG/ML INTRAMUSCULAR SOLUTION
150.0000 mg | INTRAMUSCULAR | 1 refills | Status: DC
Start: 2024-01-25 — End: 2024-01-30

## 2024-01-03 MED ORDER — HYDROXYZINE PAMOATE 25 MG CAPSULE
25.0000 mg | ORAL_CAPSULE | Freq: Four times a day (QID) | ORAL | 0 refills | Status: DC | PRN
Start: 2024-01-03 — End: 2024-01-30

## 2024-01-03 NOTE — Patient Instructions (Addendum)
 Divalproex  DR 500 mg take 1 tab by mouth twice daily  Haloperidol  Decanoate 50 mg/ml Administer 150 mg IM every 28 days Due 01/28/24  Benztropine  0.5 mg by mouth twice daily for side effects of Haloperidol .  Quetiapine  100 mg by mouth twice daily for Schizoaffective

## 2024-01-03 NOTE — Progress Notes (Signed)
 BEHAVIORAL MEDICINE, [facility]  [address]  [place] [address]  Operated by [facility]    Name: Donald Atkins MRN:  [id number]   Date: 01/03/2024 DOB:  [birth year] (38 y.o.)       Chief Complaint: Generalized Anxiety and Schizoaffective Disorder    Subjective:   Patient reports that he was upstairs [facility]) for 7 days. Discharged approx 12/29/23. Patient reports that he was hearing voices and went to the hospital. Had his first counseling session scheduled but he was in the hospital at the time. Requests to get this rescheduled. Denies auditory and visual hallucinations. Patient reports that he has been swimming a lot and doing yard work. Was in the hospital on the Fourth of July for over night observation. Has a court date upcoming that will be dismissed.        Mood Good  Medications Can tell that it's working. No adverse reactions.  Appetite God  Sleep Not great. Sleeps 4 hours per night  Energy Normal  Stressors Denies    Problem List[1]  Past Medical History[2]  Past Surgical History[3]  Family History[4]  Social History     Socioeconomic History    Marital status: Unknown   Tobacco Use    Smoking status: Never     Passive exposure: Never    Smokeless tobacco: Never   Vaping Use    Vaping status: Never Used   Substance and Sexual Activity    Alcohol use: Never    Drug use: Never    Sexual activity: Not Currently     Social Determinants of Health     Financial Resource Strain: Low Risk  (05/16/2022)    Financial Resource Strain     SDOH Financial: No   Transportation Needs: No Transportation Needs (09/22/2023)    Received from [facility] - Transportation     Lack of Transportation (Medical): No     Lack of Transportation (Non-Medical): No   Social Connections: Medium Risk (12/23/2023)    Social Connections     SDOH Social Isolation: 3 to 5 times a week   Intimate Partner Violence: Not At Risk (09/22/2023)    Received from [facility]    Humiliation, Afraid, Rape, and Kick questionnaire     Fear of Current or Ex-Partner: No     Emotionally Abused: No     Physically Abused: No     Sexually Abused: No   Housing Stability: Low Risk  (09/22/2023)    Received from [facility] Stability Vital Sign     Unable to Pay for Housing in the Last Year: No     Number of Times Moved in the Last Year: 0     Homeless in the Last Year: No      Latex, Penicillins, and Latex   Current Outpatient Medications   Medication Sig    benztropine  (COGENTIN ) 0.5 mg Oral Tablet Take 1 Tablet (0.5 mg total) by mouth Twice daily for 30 days    divalproex  (DEPAKOTE ) 500 mg Oral Tablet, Delayed Release (E.C.) Take 1 Tablet (500 mg total) by mouth Twice daily    [START ON 01/25/2024] haloperidol  decanoate (HALDOL  DECANOATE) 50 mg/mL IntraMUSCULAR Solution Inject 3 mL (150 mg total) into the muscle Every 28 days Indications: schizophrenia    hydrOXYzine  pamoate (VISTARIL ) 25 mg Oral Capsule Take 1 Capsule (25 mg total) by mouth Four times a day as needed for Anxiety  montelukast (SINGULAIR) 10 mg Oral Tablet Take 1 Tablet (10 mg total) by mouth Every night    omeprazole (PRILOSEC) 20 mg Oral Capsule, Delayed Release(E.C.) Take 1 Capsule (20 mg total) by mouth Once a day    pantoprazole  (PROTONIX ) 40 mg Oral Tablet, Delayed Release (E.C.) Take by mouth Daily        Objective :  BP (!) 141/84 (Site: Left Arm, Patient Position: Sitting)   Pulse 70   Resp 16   Ht 1.854 m (6' 1)   Wt 87.5 kg (193 lb)   BMI 25.46 kg/m       PHQ Total Score                      12/06/2023     8:18 AM   Most Recent PHQ-9 Scores   PHQ 9 Total 3          Mental Status Exam  AXOX4. Casual dress, calm, well-groomed. No SI, HI, AVH, delusions, or paranoia. Thoughts are logical, coherent, and goal-directed. Good eye contact. Speech is normal in rate and tone. Mood is okay affect congruent. No psychomotor agitation or retardation, cogwheel rigidity, or abnormal movements. Gait is  normal. Attention, concentration, and memory are good. No cognitive deficits noted. Judgment and insight are fair. Calculation and abstraction are within normal limits.     Data Reviewed  I have reviewed patient's previous note medical, surgical, family, and social history in detail today,     Assessment  Generalized Anxiety and Schizoaffective Disorder    Plan    Hydroxyzine  Pamoate 25 mg take 1 cap by mouth four times daily as needed for anxiety  Divalproex  DR 500 mg take 1 tab by mouth twice daily  Haloperidol  Decanoate 50 mg/ml Administer 150 mg IM every 28 days Due 01/28/24  Benztropine  0.5 mg by mouth twice daily for side effects of Haloperidol .  Quetiapine  100 mg by mouth twice daily for Schizoaffective  Obtain Valproic  Acid at next appointment    Follow up  Follow up in 2 weeks    [name], APRN,PMHNP-BC        [1]   Patient Active Problem List  Diagnosis    Schizophrenia    GAD (generalized anxiety disorder)    Bipolar disorder with depression (CMS HCC)    Schizoaffective disorder   [2]   Past Medical History:  Diagnosis Date    Bipolar disorder, unspecified     Borderline intellectual functioning     Convulsions     Convulsions     GAD (generalized anxiety disorder)     GERD (gastroesophageal reflux disease)     Mixed hyperlipidemia     Schizoaffective disorder     Schizophrenia    [3] No past surgical history on file.  [4] No family history on file.

## 2024-01-15 ENCOUNTER — Ambulatory Visit: Payer: MEDICAID

## 2024-01-15 ENCOUNTER — Encounter (HOSPITAL_PSYCHIATRIC): Payer: Self-pay

## 2024-01-15 ENCOUNTER — Other Ambulatory Visit: Payer: Self-pay

## 2024-01-15 VITALS — BP 132/76 | HR 73 | Resp 18 | Ht 73.0 in | Wt 193.0 lb

## 2024-01-15 DIAGNOSIS — Z79899 Other long term (current) drug therapy: Secondary | ICD-10-CM

## 2024-01-15 DIAGNOSIS — F411 Generalized anxiety disorder: Secondary | ICD-10-CM | POA: Insufficient documentation

## 2024-01-15 DIAGNOSIS — F259 Schizoaffective disorder, unspecified: Secondary | ICD-10-CM | POA: Insufficient documentation

## 2024-01-15 MED ORDER — BENZTROPINE 0.5 MG TABLET
1.0000 mg | ORAL_TABLET | Freq: Two times a day (BID) | ORAL | 0 refills | Status: DC
Start: 2024-01-15 — End: 2024-02-13

## 2024-01-15 NOTE — Progress Notes (Signed)
 BEHAVIORAL MEDICINE, [facility]  [address]  [place] [address]  Operated by [facility]    Name: Donald Atkins MRN:  [id number]   Date: 01/15/2024 DOB:  1985-10-08 (38 y.o.)       Chief Complaint: Bipolar Disorder, Generalized Anxiety, and Schizoaffective Disorder    Subjective:   Patient reports that he has been doing good, the shot has been doing really good.  Mood good  Medications Meds working well. Patient complains of occasional small muscle spasms to his jaw, back of arm, and leg.   Appetite Good  Sleep Sleeps 8 hours a night.  Energy Good  Stressors Court date, but the case will be dismissed.     Problem List[1]  Past Medical History[2]  Past Surgical History[3]  Family History[4]  Social History     Socioeconomic History    Marital status: Unknown   Tobacco Use    Smoking status: Never     Passive exposure: Never    Smokeless tobacco: Never   Vaping Use    Vaping status: Never Used   Substance and Sexual Activity    Alcohol use: Never    Drug use: Never    Sexual activity: Not Currently     Social Determinants of Health     Financial Resource Strain: Low Risk  (05/16/2022)    Financial Resource Strain     SDOH Financial: No   Transportation Needs: No Transportation Needs (09/22/2023)    Received from [facility] - Transportation     Lack of Transportation (Medical): No     Lack of Transportation (Non-Medical): No   Social Connections: Medium Risk (12/23/2023)    Social Connections     SDOH Social Isolation: 3 to 5 times a week   Intimate Partner Violence: Not At Risk (09/22/2023)    Received from [facility]    Humiliation, Afraid, Rape, and Kick questionnaire     Fear of Current or Ex-Partner: No     Emotionally Abused: No     Physically Abused: No     Sexually Abused: No   Housing Stability: Low Risk  (09/22/2023)    Received from [facility] Stability Vital Sign     Unable to Pay for Housing in the Last  Year: No     Number of Times Moved in the Last Year: 0     Homeless in the Last Year: No      Latex, Penicillins, and Latex   Current Outpatient Medications   Medication Sig    benztropine  (COGENTIN ) 0.5 mg Oral Tablet Take 1 Tablet (0.5 mg total) by mouth Twice daily for 30 days    divalproex  (DEPAKOTE ) 500 mg Oral Tablet, Delayed Release (E.C.) Take 1 Tablet (500 mg total) by mouth Twice daily for 30 days    [START ON 01/25/2024] haloperidol  decanoate (HALDOL  DECANOATE) 50 mg/mL IntraMUSCULAR Solution Inject 3 mL (150 mg total) into the muscle Every 28 days Indications: schizophrenia    hydrOXYzine  pamoate (VISTARIL ) 25 mg Oral Capsule Take 1 Capsule (25 mg total) by mouth Four times a day as needed for Anxiety    montelukast (SINGULAIR) 10 mg Oral Tablet Take 1 Tablet (10 mg total) by mouth Every night (Patient not taking: Reported on 01/15/2024)    omeprazole (PRILOSEC) 20 mg Oral Capsule, Delayed Release(E.C.) Take 1 Capsule (20 mg total) by mouth Once a day (Patient not taking: Reported on 01/15/2024)  pantoprazole  (PROTONIX ) 40 mg Oral Tablet, Delayed Release (E.C.) Take by mouth Daily (Patient not taking: Reported on 01/15/2024)        Objective :  BP 132/76 (Site: Left Arm, Patient Position: Sitting)   Pulse 73   Resp 18   Ht 1.854 m (6' 1)   Wt 87.5 kg (193 lb)   BMI 25.46 kg/m       PHQ Total Score  PHQ 2 Total: 0  PHQ 9 Total: 0  Interpretation of Total Score: 0-4 No depression             12/06/2023     8:18 AM 01/03/2024    12:44 PM 01/15/2024     1:07 PM   Most Recent PHQ-9 Scores   PHQ 9 Total 3 6 0          Mental Status Exam  AXOX4. Casual dress, calm, well-groomed. No SI, HI, AVH, delusions, or paranoia. Thoughts are logical, coherent, and goal-directed. Good eye contact. Speech is normal in rate and tone. Mood is okay affect congruent. No psychomotor agitation or retardation, cogwheel rigidity, or abnormal movements. Gait is normal. Attention, concentration, and memory are good. No cognitive  deficits noted. Judgment and insight are fair. Calculation and abstraction are within normal limits.     Data Reviewed  I have reviewed patient's previous note medical, surgical, family, and social history in detail today,     Assessment  Generalized Anxiety and Schizoaffective Disorder     Plan  Hydroxyzine  Pamoate 25 mg take 1 cap by mouth four times daily as needed for anxiety  Divalproex  DR 500 mg take 1 tab by mouth twice daily  Haloperidol  Decanoate 50 mg/ml Administer 150 mg IM every 28 days Due 01/28/24  Increase Benztropine  from 0.5 mg to 1 mg by mouth twice daily for side effects of Haloperidol .  Obtain Valproic  Acid       Follow up  Follow up in 4 weeks    [name], APRN,PMHNP-BC        [1]   Patient Active Problem List  Diagnosis    Schizophrenia    GAD (generalized anxiety disorder)    Bipolar disorder with depression (CMS HCC)    Schizoaffective disorder   [2]   Past Medical History:  Diagnosis Date    Bipolar disorder, unspecified     Borderline intellectual functioning     Convulsions     Convulsions     GAD (generalized anxiety disorder)     GERD (gastroesophageal reflux disease)     Mixed hyperlipidemia     Schizoaffective disorder     Schizophrenia    [3] No past surgical history on file.  [4] No family history on file.

## 2024-01-15 NOTE — Patient Instructions (Signed)
 Hydroxyzine  Pamoate 25 mg take 1 cap by mouth four times daily as needed for anxiety  Divalproex  DR 500 mg take 1 tab by mouth twice daily  Haloperidol  Decanoate 50 mg/ml Administer 150 mg IM every 28 days Due 01/28/24  Increase Benztropine  from 0.5 mg to 1 mg by mouth twice daily for side effects of Haloperidol .  Obtain Valproic  Acid

## 2024-01-30 ENCOUNTER — Ambulatory Visit: Payer: MEDICAID

## 2024-01-30 ENCOUNTER — Ambulatory Visit (HOSPITAL_PSYCHIATRIC): Payer: MEDICAID

## 2024-01-30 ENCOUNTER — Ambulatory Visit (HOSPITAL_BASED_OUTPATIENT_CLINIC_OR_DEPARTMENT_OTHER): Payer: MEDICAID

## 2024-01-30 ENCOUNTER — Encounter (HOSPITAL_PSYCHIATRIC): Payer: Self-pay

## 2024-01-30 ENCOUNTER — Other Ambulatory Visit: Payer: Self-pay

## 2024-01-30 VITALS — BP 132/76 | HR 67 | Resp 18 | Ht 73.0 in | Wt 187.0 lb

## 2024-01-30 DIAGNOSIS — F411 Generalized anxiety disorder: Secondary | ICD-10-CM | POA: Insufficient documentation

## 2024-01-30 DIAGNOSIS — F319 Bipolar disorder, unspecified: Secondary | ICD-10-CM

## 2024-01-30 DIAGNOSIS — F259 Schizoaffective disorder, unspecified: Secondary | ICD-10-CM

## 2024-01-30 DIAGNOSIS — Z79899 Other long term (current) drug therapy: Secondary | ICD-10-CM

## 2024-01-30 MED ORDER — DIVALPROEX 500 MG TABLET,DELAYED RELEASE
500.0000 mg | DELAYED_RELEASE_TABLET | Freq: Two times a day (BID) | ORAL | 0 refills | Status: DC
Start: 2024-01-30 — End: 2024-03-20

## 2024-01-30 MED ORDER — HALOPERIDOL DECANOATE 50 MG/ML INTRAMUSCULAR SOLUTION
150.0000 mg | INTRAMUSCULAR | 1 refills | Status: DC
Start: 2024-01-30 — End: 2024-01-31

## 2024-01-30 MED ORDER — HYDROXYZINE PAMOATE 25 MG CAPSULE
25.0000 mg | ORAL_CAPSULE | Freq: Four times a day (QID) | ORAL | 0 refills | Status: DC | PRN
Start: 2024-01-30 — End: 2024-03-20
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
## 2024-01-30 NOTE — Patient Instructions (Signed)
 Hydroxyzine  Pamoate 25 mg take 1 cap by mouth four times daily as needed for anxiety  Divalproex  DR 500 mg take 1 tab by mouth twice daily  Haloperidol  Decanoate 50 mg/ml Administer 150 mg IM every 28 days Due 01/28/24  Benztropine  1 mg by mouth twice daily for side effects of Haloperidol .  Obtain Valproic  Acid

## 2024-01-30 NOTE — Progress Notes (Signed)
 BEHAVIORAL MEDICINE, [facility] [place]  [address]  [place] [address]  Operated by [facility]    Name: Donald Atkins MRN:  [id number]   Date: 01/30/2024 DOB:  24-Aug-1985 (38 y.o.)       Chief Complaint: Bipolar Disorder, Generalized Anxiety, and Schizophrenia    Subjective:   Patient reports that he has been doing good, the shot has been working, I can tell. Patient reports that this is the longest he has been without being hospitalized for his mental health. Gets out and plays with his nephews. Helping mom out around the house. Lives with mom. Denies auditory and visual hallucinations. Patient has initial therapy appointment today, and is eager to start. Reports that he is on a waiting list for an apartment.   Mood  Good  Medications Working well. No ill effects.  Appetite Good  Sleep Good  Energy Has lots of energy  Stressors Denies    Problem List[1]  Past Medical History[2]  Past Surgical History[3]  Family History[4]  Social History     Socioeconomic History    Marital status: Unknown   Tobacco Use    Smoking status: Never     Passive exposure: Never    Smokeless tobacco: Never   Vaping Use    Vaping status: Never Used   Substance and Sexual Activity    Alcohol use: Never    Drug use: Never    Sexual activity: Not Currently     Social Determinants of Health     Financial Resource Strain: Low Risk  (05/16/2022)    Financial Resource Strain     SDOH Financial: No   Transportation Needs: No Transportation Needs (09/22/2023)    Received from [facility] - [facility]     Lack of Transportation (Medical): No     Lack of Transportation (Non-Medical): No   Social Connections: Medium Risk (12/23/2023)    Social Connections     SDOH Social Isolation: 3 to 5 times a week   Intimate Partner Violence: Not At Risk (09/22/2023)    Received from [facility]    Humiliation, Afraid, Rape, and Kick questionnaire     Fear of Current or Ex-Partner:  No     Emotionally Abused: No     Physically Abused: No     Sexually Abused: No   Housing Stability: Low Risk  (09/22/2023)    Received from [facility] Stability Vital Sign     Unable to Pay for Housing in the Last Year: No     Number of Times Moved in the Last Year: 0     Homeless in the Last Year: No      Latex, Penicillins, and Latex   Current Outpatient Medications   Medication Sig    albuterol  sulfate (PROVENTIL  OR VENTOLIN  OR PROAIR ) 90 mcg/actuation Inhalation oral inhaler Take 1-2 Puffs by inhalation Every 6 hours as needed    benztropine  (COGENTIN ) 0.5 mg Oral Tablet Take 2 Tablets (1 mg total) by mouth Twice daily for 30 days    divalproex  (DEPAKOTE ) 500 mg Oral Tablet, Delayed Release (E.C.) Take 1 Tablet (500 mg total) by mouth Twice daily for 30 days    haloperidol  decanoate (HALDOL  DECANOATE) 50 mg/mL IntraMUSCULAR Solution Inject 3 mL (150 mg total) into the muscle Every 28 days Indications: schizophrenia    hydrOXYzine  pamoate (VISTARIL ) 25 mg Oral Capsule Take 1 Capsule (25 mg total) by mouth Four  times a day as needed for Anxiety    montelukast (SINGULAIR) 10 mg Oral Tablet Take 1 Tablet (10 mg total) by mouth Every night    omeprazole (PRILOSEC) 20 mg Oral Capsule, Delayed Release(E.C.) Take 1 Capsule (20 mg total) by mouth Once a day (Patient not taking: Reported on 01/30/2024)    pantoprazole  (PROTONIX ) 40 mg Oral Tablet, Delayed Release (E.C.) Take by mouth Daily (Patient not taking: Reported on 01/30/2024)        Objective :  BP 132/76 (Site: Left Arm, Patient Position: Sitting)   Pulse 67   Resp 18   Ht 1.854 m (6' 1)   Wt 84.8 kg (187 lb)   BMI 24.67 kg/m       PHQ Total Score  PHQ 2 Total: 0  PHQ 9 Total: 0  Interpretation of Total Score: 0-4 No depression             01/03/2024    12:44 PM 01/15/2024     1:07 PM 01/30/2024     9:23 AM   Most Recent PHQ-9 Scores   PHQ 9 Total 6 0 0          Mental Status Exam  AXOX4. Casual dress, calm, well-groomed. No SI, HI, AVH,  delusions, or paranoia. Thoughts are logical, coherent, and goal-directed. Good eye contact. Speech is normal in rate and tone. Mood is okay affect congruent. No psychomotor agitation or retardation, cogwheel rigidity, or abnormal movements. Gait is normal. Attention, concentration, and memory are good. No cognitive deficits noted. Judgment and insight are fair. Calculation and abstraction are within normal limits.     Data Reviewed  I have reviewed patient's previous note medical, surgical, family, and social history in detail today,     Assessment  Generalized Anxiety and Schizoaffective Disorder     Plan    Hydroxyzine  Pamoate 25 mg take 1 cap by mouth four times daily as needed for anxiety  Divalproex  DR 500 mg take 1 tab by mouth twice daily  Haloperidol  Decanoate 50 mg/ml Administer 150 mg IM every 28 days Due 01/28/24  Benztropine  1 mg by mouth twice daily for side effects of Haloperidol .  Obtain Valproic  Acid     Follow up  Follow up i n 4 weeks    [name], APRN,PMHNP-BC        [1]   Patient Active Problem List  Diagnosis    Schizophrenia    GAD (generalized anxiety disorder)    Bipolar disorder with depression (CMS HCC)    Schizoaffective disorder   [2]   Past Medical History:  Diagnosis Date    Bipolar disorder, unspecified     Borderline intellectual functioning     Convulsions     Convulsions     GAD (generalized anxiety disorder)     GERD (gastroesophageal reflux disease)     Mixed hyperlipidemia     Schizoaffective disorder     Schizophrenia    [3] No past surgical history on file.  [4] No family history on file.

## 2024-01-30 NOTE — Procedures (Signed)
 BEHAVIORAL MEDICINE, [facility] [place]  [address]  [place] [address]  Operated by [facility]  Procedure Note    Name: Suvan Stcyr MRN:  [id number]   Date: 01/30/2024 DOB:  Mar 13, 1986 (38 y.o.)         Procedures    Channing FORBES [place], MA PSYCHOLOGIST

## 2024-01-31 ENCOUNTER — Ambulatory Visit: Payer: MEDICAID

## 2024-01-31 DIAGNOSIS — F209 Schizophrenia, unspecified: Secondary | ICD-10-CM | POA: Insufficient documentation

## 2024-01-31 MED ORDER — HALOPERIDOL DECANOATE 50 MG/ML INTRAMUSCULAR SOLUTION
150.0000 mg | INTRAMUSCULAR | 1 refills | Status: DC
Start: 2024-01-31 — End: 2024-02-09

## 2024-01-31 MED ORDER — HALOPERIDOL DECANOATE 50 MG/ML INTRAMUSCULAR SOLUTION
150.0000 mg | INTRAMUSCULAR | Status: DC
Start: 2024-01-31 — End: 2024-02-09
  Administered 2024-01-31: 150 mg via INTRAMUSCULAR
  Administered 2024-02-01: 0 mg via INTRAMUSCULAR

## 2024-01-31 NOTE — Progress Notes (Signed)
 Medication and dosing witnessed and administered by this LPN and Nathanel Rooks RN.  1.5ml injected into each deltoid. Patient states does not want gluteal injection.Patient tolerated without difficulty.

## 2024-02-09 ENCOUNTER — Other Ambulatory Visit (HOSPITAL_PSYCHIATRIC): Payer: Self-pay

## 2024-02-09 MED ORDER — HALOPERIDOL DECANOATE 100 MG/ML INTRAMUSCULAR SOLUTION
100.0000 mg | INTRAMUSCULAR | 0 refills | Status: DC
Start: 2024-02-09 — End: 2024-04-10

## 2024-02-09 MED ORDER — SYRINGE WITH NEEDLE 3 ML 21 GAUGE X 1 1/2"
2.0000 | INJECTION | 0 refills | Status: AC
Start: 2024-02-09 — End: ?

## 2024-02-09 MED ORDER — HALOPERIDOL DECANOATE 50 MG/ML INTRAMUSCULAR SOLUTION
50.0000 mg | INTRAMUSCULAR | 0 refills | Status: DC
Start: 2024-02-09 — End: 2024-03-20

## 2024-02-09 MED ORDER — BLUNT NEEDLE, DISPOSABLE 18 X 1 1/2"
2.0000 | 0 refills | Status: AC
Start: 2024-02-09 — End: ?

## 2024-02-09 NOTE — Telephone Encounter (Signed)
 If you don't mind to review these orders and approve or deny. Thank You

## 2024-02-13 ENCOUNTER — Other Ambulatory Visit (HOSPITAL_PSYCHIATRIC): Payer: Self-pay

## 2024-02-13 MED ORDER — BENZTROPINE 0.5 MG TABLET
1.0000 mg | ORAL_TABLET | Freq: Two times a day (BID) | ORAL | 0 refills | Status: DC
Start: 2024-02-13 — End: 2024-04-10

## 2024-02-13 NOTE — Telephone Encounter (Signed)
 Patient called requesting a refill of cogentin  be sent to his pharmacy. After reviewing notes and current medications, prescription sent to patients pharmacy

## 2024-02-15 ENCOUNTER — Ambulatory Visit (HOSPITAL_PSYCHIATRIC): Payer: Self-pay

## 2024-02-16 IMAGING — DX DG ABDOMEN ACUTE W/ 1V CHEST
4 series · 4 of 4 positions shown · non-contrast
Comparison: Abdominal x-ray 08/01/2021.

CLINICAL DATA: Possible foreign body ingestion.

EXAM:
DG ABDOMEN ACUTE WITH 1 VIEW CHEST

[chest pa]
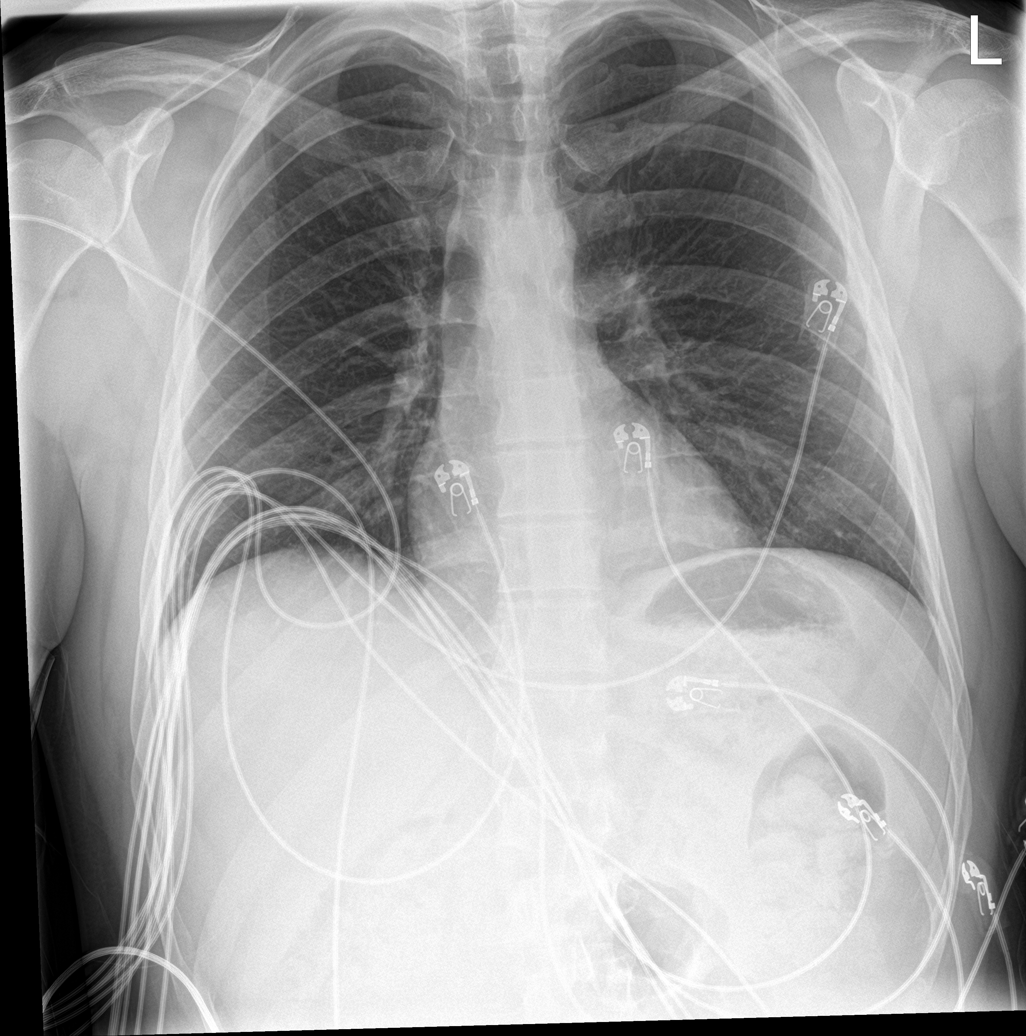

[abdomen erect]
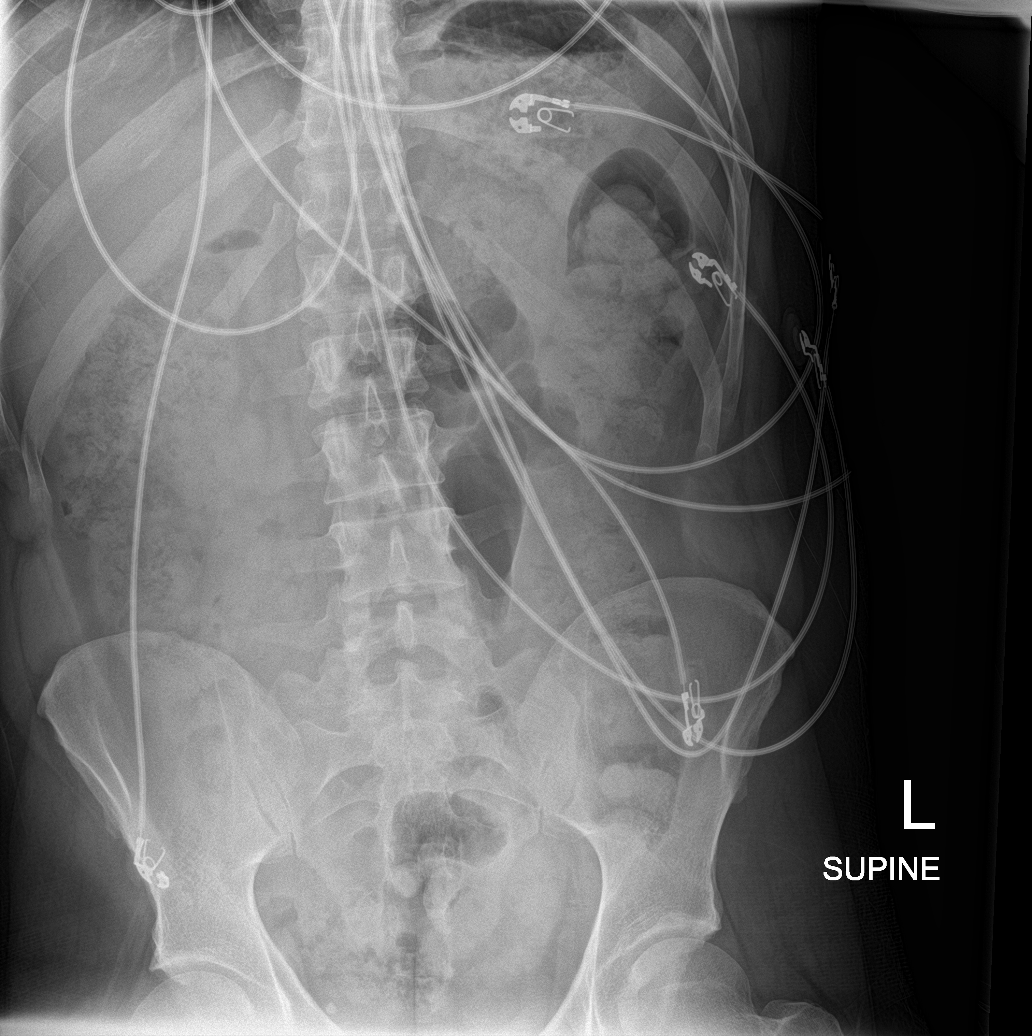

[abdomen supine (1 of 2)]
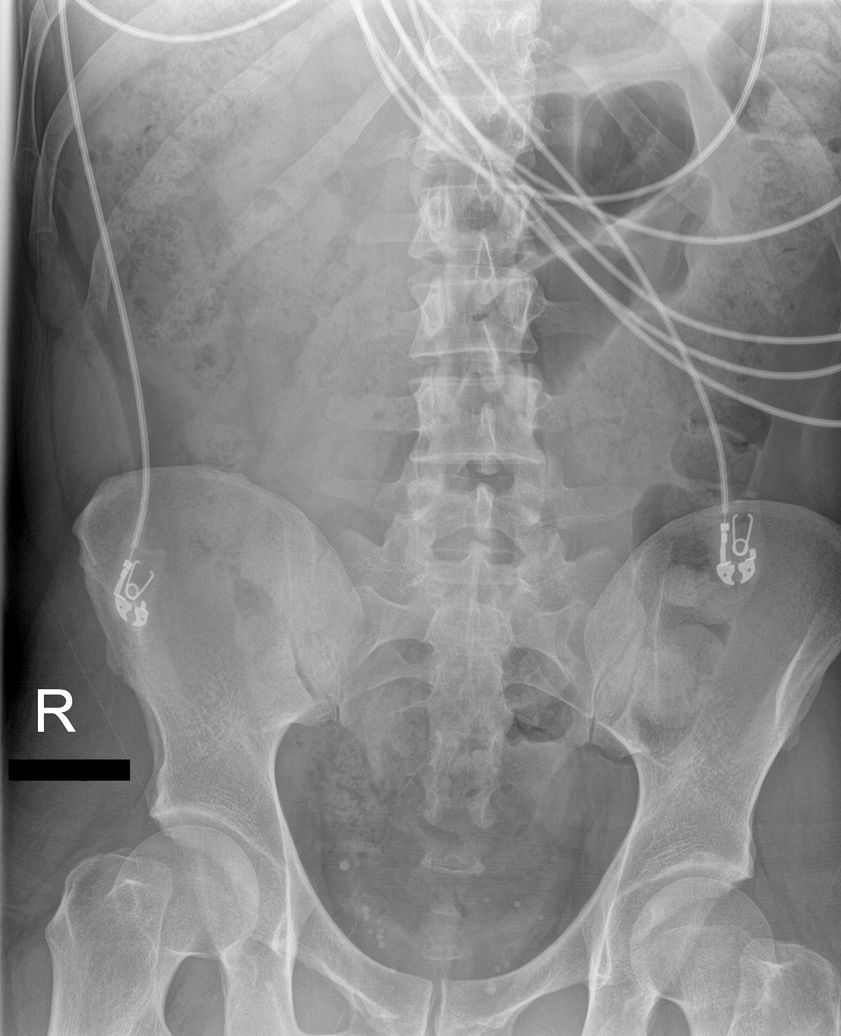

[abdomen supine (2 of 2)]
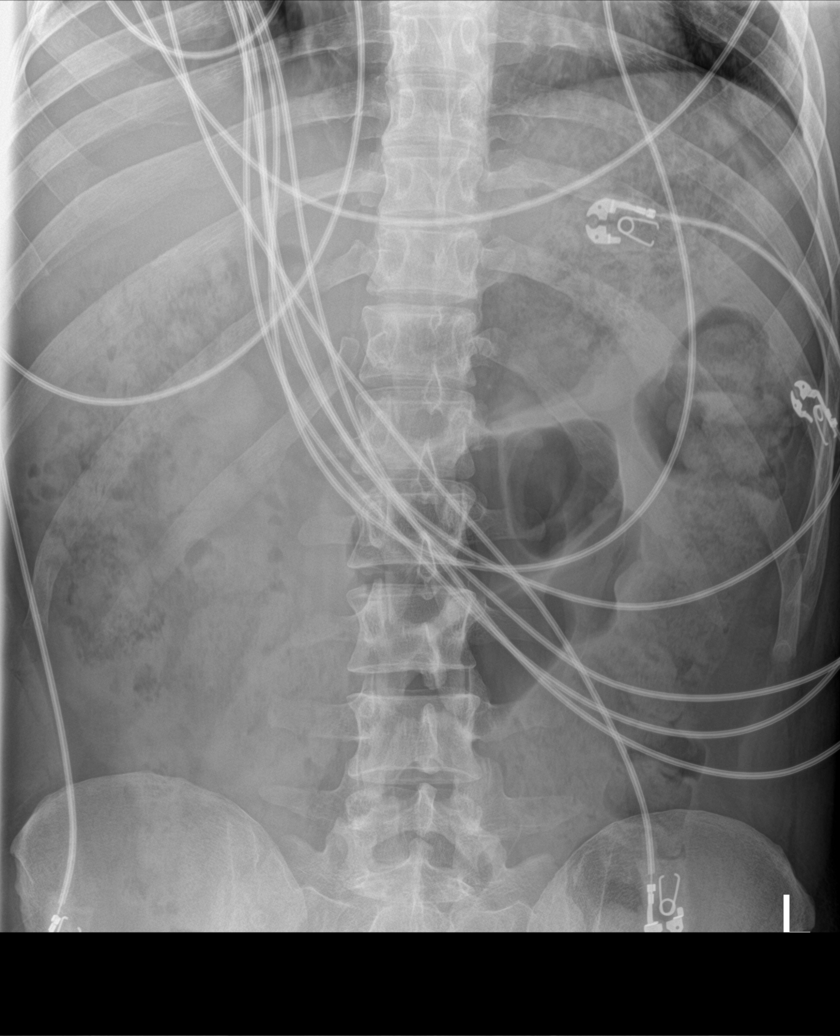

[4 of 4 positions shown; findings below may reference images not displayed]

FINDINGS: No foreign body identified. There is no evidence of dilated bowel
loops or free intraperitoneal air. There is a large amount of stool
throughout the colon. No suspicious radiopaque calculi or other
significant radiographic abnormality is seen. Phleboliths are noted
in the pelvis. Heart size and mediastinal contours are within normal
limits. Both lungs are clear.
IMPRESSION: Negative abdominal radiographs.  No acute cardiopulmonary disease.

## 2024-02-21 ENCOUNTER — Ambulatory Visit: Payer: MEDICAID

## 2024-02-21 ENCOUNTER — Other Ambulatory Visit: Payer: Self-pay

## 2024-02-21 DIAGNOSIS — F259 Schizoaffective disorder, unspecified: Secondary | ICD-10-CM | POA: Insufficient documentation

## 2024-02-21 LAB — VALPROIC ACID LEVEL: VALPROIC ACID LEVEL: <10 ug/mL — ABNORMAL LOW (ref 50.0–100.0)

## 2024-03-01 ENCOUNTER — Ambulatory Visit (HOSPITAL_PSYCHIATRIC): Payer: MEDICAID

## 2024-03-01 ENCOUNTER — Ambulatory Visit: Payer: MEDICAID

## 2024-03-01 ENCOUNTER — Encounter (HOSPITAL_PSYCHIATRIC): Payer: Self-pay

## 2024-03-01 ENCOUNTER — Ambulatory Visit (HOSPITAL_BASED_OUTPATIENT_CLINIC_OR_DEPARTMENT_OTHER): Payer: MEDICAID

## 2024-03-01 ENCOUNTER — Other Ambulatory Visit: Payer: Self-pay

## 2024-03-01 VITALS — Resp 17 | Ht 73.0 in | Wt 185.0 lb

## 2024-03-01 DIAGNOSIS — F259 Schizoaffective disorder, unspecified: Secondary | ICD-10-CM | POA: Insufficient documentation

## 2024-03-01 DIAGNOSIS — F411 Generalized anxiety disorder: Secondary | ICD-10-CM

## 2024-03-01 DIAGNOSIS — F209 Schizophrenia, unspecified: Secondary | ICD-10-CM | POA: Insufficient documentation
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
## 2024-03-01 NOTE — Progress Notes (Signed)
 BEHAVIORAL MEDICINE, [facility] [place]  [address]  [place] [address]  Operated by [facility]    Name: Donald Atkins MRN:  [id number]   Date: 03/01/2024 DOB:  [birth year] (38 y.o.)       Chief Complaint: Schizoaffective Disorder    Subjective:   Patient reports that he was in [facility] for a day or 2 for panic attacks. Has a diagnosis of SI, but patient reports that this is from past times and that he was not suicidal now or during that admission. Medications have already been called in from the hospital stay. Patient came in early today. Plans to go to to a Halloween activity today.    Mood Good  Medications Doing good. I like the meds I'm on. It's just that shot was wearing of too quick.  Appetite Good  Sleep Good  Energy A lot of energy.  Stressors Court date, I've been doing what I'm supposed to, so they'll dismiss it. October 16.    Problem List[1]  Past Medical History[2]  Past Surgical History[3]  Family History[4]  Social History     Socioeconomic History    Marital status: Unknown   Tobacco Use    Smoking status: Never     Passive exposure: Never    Smokeless tobacco: Never   Vaping Use    Vaping status: Never Used   Substance and Sexual Activity    Alcohol use: Never    Drug use: Never    Sexual activity: Not Currently     Social Determinants of Health     Financial Resource Strain: Low Risk (05/16/2022)    Financial Resource Strain     SDOH Financial: No   Transportation Needs: No Transportation Needs (02/27/2024)    Received from [facility] - Transportation     In the past 12 months, has lack of transportation kept you from medical appointments or from getting medications?: No     In the past 12 months, has lack of transportation kept you from meetings, work, or from getting things needed for daily living?: No   Social Connections: Medium Risk (12/23/2023)    Social Connections     SDOH Social Isolation: 3 to 5 times a  week   Intimate Partner Violence: Not At Risk (02/27/2024)    Received from [facility]    Humiliation, Afraid, Rape, and Kick questionnaire     Within the last year, have you been afraid of your partner or ex-partner?: No     Within the last year, have you been humiliated or emotionally abused in other ways by your partner or ex-partner?: No     Within the last year, have you been kicked, hit, slapped, or otherwise physically hurt by your partner or ex-partner?: No     Within the last year, have you been raped or forced to have any kind of sexual activity by your partner or ex-partner?: No   Housing Stability: Unknown (02/27/2024)    Received from [facility] Stability Vital Sign     In the last 12 months, was there a time when you were not able to pay the mortgage or rent on time?: No     At any time in the past 12 months, were you homeless or living in a shelter (including now)?: No      Latex, Penicillins, and Latex   Current Outpatient Medications   Medication Sig  benztropine  (COGENTIN ) 0.5 mg Oral Tablet Take 2 Tablets (1 mg total) by mouth Twice daily for 30 days    benztropine  (COGENTIN ) 1 mg Oral Tablet Take 1 Tablet (1 mg total) by mouth Three times a day    Blunt Needle, Disposable 18 x 1 1/2  Needle 2 Each Every 30 days    divalproex  (DEPAKOTE ) 500 mg Oral Tablet, Delayed Release (E.C.) Take 1 Tablet (500 mg total) by mouth Twice daily for 30 days    haloperidoL  (HALDOL ) 2 mg Oral Tablet Take 1 Tablet (2 mg total) by mouth Once per day as needed for Other    haloperidol  decanoate (HALDOL  DECANOATE) 100 mg/mL IntraMUSCULAR Solution Inject 1 mL (100 mg total) into the muscle Every 30 days To be given with 50 mg to equal 150mg  monthly (Patient taking differently: Inject 1 mL (100 mg total) into the muscle Every 30 days To be given with 50 mg to equal 150mg  every 21 days)    haloperidol  decanoate (HALDOL  DECANOATE) 50 mg/mL IntraMUSCULAR Solution Inject 1 mL (50 mg total) into the muscle  Every 30 days To be given with 100 mg to equal 150mg  monthly (Patient taking differently: Inject 1 mL (50 mg total) into the muscle Every 30 days To be given with 100 mg to equal 150mg  every 21 days)    hydrOXYzine  pamoate (VISTARIL ) 25 mg Oral Capsule Take 1 Capsule (25 mg total) by mouth Four times a day as needed for Anxiety    montelukast (SINGULAIR) 10 mg Oral Tablet Take 1 Tablet (10 mg total) by mouth Every night    pantoprazole  (PROTONIX ) 40 mg Oral Tablet, Delayed Release (E.C.) Take 1 Tablet (40 mg total) by mouth Daily    Syringe with Needle, Disp, (SYRINGE 3CC/21GX1-1/2) 3 mL 21 gauge x 1 1/2 Syringe 2 Each Every 30 days        Objective :  Resp 17   Ht 1.854 m (6' 1)   Wt 83.9 kg (185 lb)   BMI 24.41 kg/m       PHQ Total Score  PHQ 2 Total: 0  PHQ 9 Total: 0  Interpretation of Total Score: 0-4 No depression             01/15/2024     1:07 PM 01/30/2024     9:23 AM 03/01/2024     8:37 AM   Most Recent PHQ-9 Scores   PHQ 9 Total 0 0 0           Mental Status Exam  AXOX4. Casual dress, calm, well-groomed. No SI, HI, AVH, delusions, or paranoia. Thoughts are logical, coherent, and goal-directed. Good eye contact. Speech is normal in rate and tone. Mood is okay affect congruent. No psychomotor agitation or retardation, cogwheel rigidity, or abnormal movements. Gait is normal. Attention, concentration, and memory are good. No cognitive deficits noted. Judgment and insight are fair. Calculation and abstraction are within normal limits.     Data Reviewed  I have reviewed patient's previous note medical, surgical, family, and social history in detail today,     Assessment  Generalized Anxiety and Schizoaffective Disorder     Plan  Hydroxyzine  Pamoate 25 mg take 1 cap by mouth four times daily as needed for anxiety  Divalproex  DR 500 mg take 1 tab by mouth twice daily for mood stabilization  Start Haloperidol  2 mg take 1 tab by mouth daily as needed for hallucinations  Increase Haloperidol  Decanoate 50 mg/ml  Administer 150 mg IM from every  28 days to every 21 days Due 03/20/24  Increase Benztropine  1 mg by mouth from twice daily to three times daily for side effects of Haloperidol .  Med changes initiated at [facility].  Obtain Valproic  Acid       Follow up  Follow up in 3 weeks    [name], APRN,PMHNP-BC        [1]   Patient Active Problem List  Diagnosis    Schizophrenia    GAD (generalized anxiety disorder)    Bipolar disorder with depression (CMS HCC)    Schizoaffective disorder   [2]   Past Medical History:  Diagnosis Date    Bipolar disorder, unspecified     Borderline intellectual functioning     Convulsions     Convulsions     GAD (generalized anxiety disorder)     GERD (gastroesophageal reflux disease)     Mixed hyperlipidemia     Schizoaffective disorder     Schizophrenia    [3] No past surgical history on file.  [4] No family history on file.

## 2024-03-20 ENCOUNTER — Ambulatory Visit (HOSPITAL_PSYCHIATRIC): Payer: MEDICAID

## 2024-03-20 ENCOUNTER — Ambulatory Visit: Payer: MEDICAID

## 2024-03-20 ENCOUNTER — Encounter (HOSPITAL_PSYCHIATRIC): Payer: Self-pay

## 2024-03-20 ENCOUNTER — Other Ambulatory Visit: Payer: Self-pay

## 2024-03-20 VITALS — BP 145/90 | HR 63 | Resp 18 | Ht 73.0 in | Wt 185.0 lb

## 2024-03-20 DIAGNOSIS — F319 Bipolar disorder, unspecified: Secondary | ICD-10-CM | POA: Insufficient documentation

## 2024-03-20 DIAGNOSIS — F258 Other schizoaffective disorders: Secondary | ICD-10-CM

## 2024-03-20 DIAGNOSIS — F411 Generalized anxiety disorder: Secondary | ICD-10-CM

## 2024-03-20 DIAGNOSIS — F251 Schizoaffective disorder, depressive type: Secondary | ICD-10-CM | POA: Insufficient documentation

## 2024-03-20 DIAGNOSIS — F259 Schizoaffective disorder, unspecified: Secondary | ICD-10-CM

## 2024-03-20 DIAGNOSIS — Z79899 Other long term (current) drug therapy: Secondary | ICD-10-CM

## 2024-03-20 MED ORDER — BENZTROPINE 1 MG TABLET
1.0000 mg | ORAL_TABLET | Freq: Three times a day (TID) | ORAL | 0 refills | Status: DC
Start: 2024-03-20 — End: 2024-04-10

## 2024-03-20 MED ORDER — HYDROXYZINE PAMOATE 25 MG CAPSULE
25.0000 mg | ORAL_CAPSULE | Freq: Four times a day (QID) | ORAL | 0 refills | Status: DC | PRN
Start: 2024-03-20 — End: 2024-04-10

## 2024-03-20 MED ORDER — HALOPERIDOL DECANOATE 50 MG/ML INTRAMUSCULAR SOLUTION
150.0000 mg | Freq: Once | INTRAMUSCULAR | Status: AC
Start: 2024-03-20 — End: 2024-03-20
  Administered 2024-03-20: 150 mg via INTRAMUSCULAR

## 2024-03-20 MED ORDER — HALOPERIDOL DECANOATE 50 MG/ML INTRAMUSCULAR SOLUTION
50.0000 mg | INTRAMUSCULAR | 0 refills | Status: DC
Start: 2024-03-20 — End: 2024-04-10

## 2024-03-20 MED ORDER — HALOPERIDOL 2 MG TABLET
2.0000 mg | ORAL_TABLET | Freq: Every day | ORAL | 0 refills | Status: DC | PRN
Start: 2024-03-20 — End: 2024-04-10

## 2024-03-20 MED ORDER — CLONAZEPAM 0.5 MG TABLET
0.5000 mg | ORAL_TABLET | Freq: Two times a day (BID) | ORAL | 0 refills | Status: DC | PRN
Start: 2024-03-20 — End: 2024-04-10

## 2024-03-20 MED ORDER — BUSPIRONE 10 MG TABLET
10.0000 mg | ORAL_TABLET | Freq: Three times a day (TID) | ORAL | 0 refills | Status: DC
Start: 2024-03-20 — End: 2024-04-10

## 2024-03-20 MED ORDER — DIVALPROEX 500 MG TABLET,DELAYED RELEASE
500.0000 mg | DELAYED_RELEASE_TABLET | Freq: Two times a day (BID) | ORAL | 0 refills | Status: DC
Start: 2024-03-20 — End: 2024-04-10

## 2024-03-20 NOTE — Patient Instructions (Signed)
 Hydroxyzine  Pamoate 25 mg take 1 cap by mouth four times daily as needed for anxiety  Divalproex  DR 500 mg take 1 tab by mouth twice daily for mood stabilization  Haloperidol  2 mg take 1 tab by mouth daily as needed for hallucinations  Haloperidol  Decanoate 50 mg/ml Administer 150 mg IM every 21 days Due 04/10/24  Benztropine  1 mg by mouth three times daily for side effects of Haloperidol .  Start Buspirone 10 mg take 1 tab by mouth three times daily for increased anxiety.  Start Klonopin 0.5 mg take 1 tab by mouth twice daily as needed for anxiety.
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
## 2024-03-20 NOTE — Progress Notes (Signed)
 Patient injection of Haldol  Deconate 150mg  total - patient vials 100 mg and 50 mg. Injection administered by Randine Malling, LPN and Nathanel Rooks, RN. 50 mg in Left Deltoid and 100 mg in Right Deltoid. Patient tolerated well.   This is a patient administered medication. Do Not Charge.

## 2024-03-20 NOTE — Progress Notes (Signed)
 BEHAVIORAL MEDICINE, [facility]  [address]  [place] [address]  Operated by [facility]    Name: Donald Atkins MRN:  [id number]   Date: 03/20/2024 DOB:  09-01-1985 (38 y.o.)       Chief Complaint: Schizoaffective Disorder and Generalized Anxiety    Subjective:   Patient reports that his anxiety is super high. I keep pacing before going to bed. Has been biting and picking nails. Denies SI. Reports that he has taken Klonopin in the past with good results. Denies having hallucinations.    Mood Anxious I can't sit still.  Medications The shot is working well. No ill effects.  Appetite Good  Sleep Hasn't slept in 3 days, anxiety bothersome.  Energy Good.  Stressors Has court on March 28, 2024.    Problem List[1]  Past Medical History[2]  Past Surgical History[3]  Family History[4]  Social History     Socioeconomic History    Marital status: Unknown   Tobacco Use    Smoking status: Never     Passive exposure: Never    Smokeless tobacco: Never   Vaping Use    Vaping status: Never Used   Substance and Sexual Activity    Alcohol use: Never    Drug use: Never    Sexual activity: Not Currently     Social Determinants of Health     Financial Resource Strain: Low Risk (05/16/2022)    Financial Resource Strain     SDOH Financial: No   Transportation Needs: No Transportation Needs (02/27/2024)    Received from [facility] - Transportation     In the past 12 months, has lack of transportation kept you from medical appointments or from getting medications?: No     In the past 12 months, has lack of transportation kept you from meetings, work, or from getting things needed for daily living?: No   Social Connections: Medium Risk (12/23/2023)    Social Connections     SDOH Social Isolation: 3 to 5 times a week   Intimate Partner Violence: Not At Risk (02/27/2024)    Received from [facility]    Humiliation, Afraid, Rape, and Kick questionnaire     Within the last year,  have you been afraid of your partner or ex-partner?: No     Within the last year, have you been humiliated or emotionally abused in other ways by your partner or ex-partner?: No     Within the last year, have you been kicked, hit, slapped, or otherwise physically hurt by your partner or ex-partner?: No     Within the last year, have you been raped or forced to have any kind of sexual activity by your partner or ex-partner?: No   Housing Stability: Unknown (02/27/2024)    Received from [facility] Stability Vital Sign     In the last 12 months, was there a time when you were not able to pay the mortgage or rent on time?: No     At any time in the past 12 months, were you homeless or living in a shelter (including now)?: No      Latex, Penicillins, and Latex   Current Outpatient Medications   Medication Sig    benztropine  (COGENTIN ) 1 mg Oral Tablet Take 1 Tablet (1 mg total) by mouth Three times a day    Blunt Needle, Disposable 18 x 1 1/2  Needle 2 Each Every 30 days  haloperidoL  (HALDOL ) 2 mg Oral Tablet Take 1 Tablet (2 mg total) by mouth Once per day as needed for Other    haloperidol  decanoate (HALDOL  DECANOATE) 100 mg/mL IntraMUSCULAR Solution Inject 1 mL (100 mg total) into the muscle Every 30 days To be given with 50 mg to equal 150mg  monthly (Patient taking differently: Inject 1 mL (100 mg total) into the muscle Every 30 days To be given with 50 mg to equal 150mg  every 21 days)    haloperidol  decanoate (HALDOL  DECANOATE) 50 mg/mL IntraMUSCULAR Solution Inject 1 mL (50 mg total) into the muscle Every 30 days To be given with 100 mg to equal 150mg  monthly (Patient taking differently: Inject 1 mL (50 mg total) into the muscle Every 30 days To be given with 100 mg to equal 150mg  every 21 days)    hydrOXYzine  pamoate (VISTARIL ) 25 mg Oral Capsule Take 1 Capsule (25 mg total) by mouth Four times a day as needed for Anxiety    montelukast (SINGULAIR) 10 mg Oral Tablet Take 1 Tablet (10 mg total) by  mouth Every night    pantoprazole  (PROTONIX ) 40 mg Oral Tablet, Delayed Release (E.C.) Take 1 Tablet (40 mg total) by mouth Daily    Syringe with Needle, Disp, (SYRINGE 3CC/21GX1-1/2) 3 mL 21 gauge x 1 1/2 Syringe 2 Each Every 30 days        Objective :  BP (!) 145/90   Pulse 63   Resp 18   Ht 1.854 m (6' 1)   Wt 83.9 kg (185 lb)   BMI 24.41 kg/m       PHQ Total Score                  Most Recent PHQ-9 Scores PHQ 9 Total   01/15/2024   1:07 PM 0   01/30/2024   9:23 AM 0   03/01/2024   8:37 AM 0                   Mental Status Exam  AXOX4. Casual dress, calm, well-groomed. No SI, HI, AVH, delusions, or paranoia. Thoughts are logical, coherent, and goal-directed. Good eye contact. Speech is normal in rate and tone. Mood is okay affect congruent. No psychomotor agitation or retardation, cogwheel rigidity, or abnormal movements. Gait is normal. Attention, concentration, and memory are good. No cognitive deficits noted. Judgment and insight are fair. Calculation and abstraction are within normal limits.     Data Reviewed  I have reviewed patient's previous note medical, surgical, family, and social history in detail today,     Assessment  Generalized Anxiety and Schizoaffective Disorder     Plan  Hydroxyzine  Pamoate 25 mg take 1 cap by mouth four times daily as needed for anxiety  Divalproex  DR 500 mg take 1 tab by mouth twice daily for mood stabilization  Haloperidol  2 mg take 1 tab by mouth daily as needed for hallucinations  Haloperidol  Decanoate 50 mg/ml Administer 150 mg IM every 21 days Due 04/10/24  Benztropine  1 mg by mouth three times daily for side effects of Haloperidol .  Start Buspirone 10 mg take 1 tab by mouth three times daily for increased anxiety.  Start Klonopin 0.5 mg take 1 tab by mouth twice daily as needed for anxiety.      Follow up  Follow up in 4 weeks     [name], APRN, CNP        [1]   Patient Active Problem List  Diagnosis  Schizophrenia    GAD (generalized anxiety disorder)     Bipolar disorder with depression (CMS HCC)    Schizoaffective disorder   [2]   Past Medical History:  Diagnosis Date    Bipolar disorder, unspecified     Borderline intellectual functioning     Convulsions     Convulsions     GAD (generalized anxiety disorder)     GERD (gastroesophageal reflux disease)     Mixed hyperlipidemia     Schizoaffective disorder     Schizophrenia    [3] No past surgical history on file.  [4] No family history on file.

## 2024-04-03 ENCOUNTER — Encounter (HOSPITAL_PSYCHIATRIC): Payer: Self-pay

## 2024-04-03 NOTE — Nursing Note (Signed)
 Verbal order to [place] at [facility] for syringes with 18 gauge needles to draw up Haldol  and to give the injections.

## 2024-04-05 ENCOUNTER — Ambulatory Visit: Payer: MEDICAID

## 2024-04-05 ENCOUNTER — Other Ambulatory Visit: Payer: Self-pay

## 2024-04-05 DIAGNOSIS — F251 Schizoaffective disorder, depressive type: Secondary | ICD-10-CM | POA: Insufficient documentation

## 2024-04-05 DIAGNOSIS — F411 Generalized anxiety disorder: Secondary | ICD-10-CM | POA: Insufficient documentation

## 2024-04-05 DIAGNOSIS — F319 Bipolar disorder, unspecified: Secondary | ICD-10-CM | POA: Insufficient documentation

## 2024-04-05 NOTE — Psychotherapy Note (Signed)
 [facility] MEDICINE  [facility] [place]      PATIENT NAME: Donald Atkins   DATE OF BIRTH: November 21, 1985    CHART NUMBER:    [id number]  DATE OF SERVICE: 04/05/2024  DURATION: 60 minutes   CPT CODE: 09162      CHIEF COMPLAINT: Patient presents today for follow-up of Bipolar    SUBJECTIVE/OBJECTIVE: [name] said he has been sleeping good and has a new puppy.  He shared pictures of his pets. His mom has been a driver for food delivery and he has ridden with her some.  He enjoys getting out of the house.        He has another court appearance in 3 months.  The judge told him to continue to attend his sessions, his doctor and take his medicine.  He said he is taking his medicine and it is helpful. He plans to keep doing this.  Another motivator was his fine will be dismissed as long as he is compliant and has no more trouble.        Encouraged him to continue following the court orders. Talked about a couple of potential groups to think about.       Startles easy but not as significant reaction as has been observed at other sessions.  He smiles frequently, makes good eye contact, is well-groomed, and conversation flow and interactions are good.     MENTAL STATUS: Pt was cooperative throughout the session. Pt was fully oriented, alert, and attentive. Patient appeared stated age and was of average height and weight. Grooming, hygiene, and dress were WNL. There were no abnormalities with gait or movements. Level of consciousness was WNL. Mood was anxious with congruent affect. Thought processes were linear and devoid of psychotic features. Memory was intact. Speech was WNL for volume, rate, and tone. Judgement and insight were good. No VH/AH were presented. Pt denied current SI/HI.    CURRENT RISK LEVEL: Based upon patient presentation and therapist observation during session, patient appears not to be SI/HI.  In the event of increase in SI or HI, I recommended the patient use the following resources to reduce risk  of harm to self or others.  Call 9-1-1  Go to the nearest emergency room  Call the National Suicide Prevention Lifeline (988)  Suicide Prevention Lifeline Online Chat - dealerspiff.com.pt  Crisis Call: 412-412-6630 or (304) 325-HOPE      ICD-10-CM    1. Bipolar disorder with depression (CMS HCC)  F31.9       2. Schizoaffective disorder, depressive type (CMS HCC)  F25.1       3. GAD (generalized anxiety disorder)  F41.1            GOALS:  Pt will focus on developing skills to help manage and cope with current symptoms.  Goals will be established to help improve daily functioning.    RECOMMENDATIONS AND PLAN: Tavone Caesar will benefit from regular psychotherapy. Patient will return to this clinic in 4 week(s) for a follow-up appt.    Channing FORBES [place], MA  04/05/2024, 15:00

## 2024-04-09 ENCOUNTER — Encounter (HOSPITAL_BASED_OUTPATIENT_CLINIC_OR_DEPARTMENT_OTHER): Payer: Self-pay

## 2024-04-09 ENCOUNTER — Emergency Department
Admission: EM | Admit: 2024-04-09 | Discharge: 2024-04-10 | Disposition: A | Discharge status: Home / Self Care | Payer: MEDICAID

## 2024-04-09 ENCOUNTER — Other Ambulatory Visit: Payer: Self-pay

## 2024-04-09 DIAGNOSIS — F259 Schizoaffective disorder, unspecified: Secondary | ICD-10-CM | POA: Diagnosis present

## 2024-04-09 DIAGNOSIS — Z029 Encounter for administrative examinations, unspecified: Secondary | ICD-10-CM

## 2024-04-09 DIAGNOSIS — F418 Other specified anxiety disorders: Secondary | ICD-10-CM

## 2024-04-09 DIAGNOSIS — F319 Bipolar disorder, unspecified: Secondary | ICD-10-CM | POA: Diagnosis present

## 2024-04-09 LAB — CBC WITH DIFF
BASOPHIL #: 0.02 x10ˆ3/uL (ref 0.00–0.10)
BASOPHIL %: 0 % (ref 0–1)
EOSINOPHIL #: 0.05 x10ˆ3/uL (ref 0.00–0.60)
EOSINOPHIL %: 1 % (ref 1–8)
HCT: 44.7 % (ref 36.7–47.1)
HGB: 14.8 g/dL (ref 12.5–16.3)
LYMPHOCYTE #: 1.83 x10ˆ3/uL (ref 1.00–3.00)
LYMPHOCYTE %: 31 % (ref 15–43)
MCH: 29.1 pg (ref 23.8–33.4)
MCHC: 33.2 g/dL (ref 32.5–36.3)
MCV: 87.8 fL (ref 73.0–96.2)
MONOCYTE #: 0.37 x10ˆ3/uL (ref 0.30–1.00)
MONOCYTE %: 6 % (ref 6–14)
MPV: 7.9 fL (ref 7.4–11.4)
NEUTROPHIL #: 3.72 x10ˆ3/uL (ref 1.85–7.84)
NEUTROPHIL %: 62 % (ref 44–74)
PLATELETS: 268 x10ˆ3/uL (ref 140–440)
RBC: 5.09 x10ˆ6/uL (ref 4.06–5.63)
RDW: 16.9 % — ABNORMAL HIGH (ref 12.1–16.2)
WBC: 6 x10ˆ3/uL (ref 3.6–10.2)

## 2024-04-09 LAB — URINALYSIS, MACRO/MICRO
BILIRUBIN: NEGATIVE mg/dL
BLOOD: NEGATIVE mg/dL
GLUCOSE: NEGATIVE mg/dL
KETONES: NEGATIVE mg/dL
LEUKOCYTES: NEGATIVE WBCs/uL
NITRITE: NEGATIVE
PH: 6 (ref 4.6–8.0)
PROTEIN: NEGATIVE mg/dL
SPECIFIC GRAVITY: <=1.005 (ref 1.003–1.035)
UROBILINOGEN: 0.2 mg/dL (ref 0.2–1.0)

## 2024-04-09 LAB — DRUG SCREEN, NO CONFIRMATION, URINE
AMPHETAMINES URINE: NEGATIVE
BARBITURATES URINE: NEGATIVE
BENZODIAZEPINES URINE: NEGATIVE
CANNABINOIDS URINE: NEGATIVE
COCAINE METABOLITES URINE: NEGATIVE
METHADONE URINE: NEGATIVE
OPIATES URINE: NEGATIVE
PCP URINE: NEGATIVE

## 2024-04-09 LAB — COMPREHENSIVE METABOLIC PANEL, NON-FASTING
ALBUMIN/GLOBULIN RATIO: 1.3 (ref 0.8–1.4)
ALBUMIN: 4.3 g/dL (ref 3.4–5.0)
ALKALINE PHOSPHATASE: 107 U/L (ref 46–116)
ALT (SGPT): 46 U/L (ref <=78)
ANION GAP: 9 mmol/L (ref 4–13)
AST (SGOT): 23 U/L (ref 15–37)
BILIRUBIN TOTAL: 0.5 mg/dL (ref 0.2–1.0)
BUN/CREA RATIO: 8
BUN: 9 mg/dL (ref 7–18)
CALCIUM, CORRECTED: 8.6 mg/dL
CALCIUM: 8.8 mg/dL (ref 8.5–10.1)
CHLORIDE: 106 mmol/L (ref 98–107)
CO2 TOTAL: 28 mmol/L (ref 21–32)
CREATININE: 1.09 mg/dL (ref 0.70–1.30)
ESTIMATED GFR: 90 mL/min/1.73mˆ2 (ref >59)
GLOBULIN: 3.4
GLUCOSE: 101 mg/dL (ref 74–106)
OSMOLALITY, CALCULATED: 284 mosm/kg (ref 270–290)
POTASSIUM: 3.8 mmol/L (ref 3.5–5.1)
PROTEIN TOTAL: 7.7 g/dL (ref 6.4–8.2)
SODIUM: 143 mmol/L (ref 136–145)

## 2024-04-09 LAB — SALICYLATE LEVEL: SALICYLATE LEVEL: 0 mg/dL (ref 0–20)

## 2024-04-09 LAB — THYROID STIMULATING HORMONE (SENSITIVE TSH): TSH: 0.992 u[IU]/mL (ref 0.358–3.740)

## 2024-04-09 LAB — ACETAMINOPHEN LEVEL: ACETAMINOPHEN LEVEL: 0 ug/mL — ABNORMAL LOW (ref 10.0–30.0)

## 2024-04-09 LAB — ETHANOL, SERUM/PLASMA: ETHANOL: <3 mg/dL (ref <=3)

## 2024-04-09 MED ORDER — DIVALPROEX 250 MG TABLET,DELAYED RELEASE
500.0000 mg | DELAYED_RELEASE_TABLET | Freq: Two times a day (BID) | ORAL | Status: DC
Start: 2024-04-09 — End: 2024-04-19
  Administered 2024-04-09: 500 mg via ORAL

## 2024-04-09 MED ORDER — DIVALPROEX 250 MG TABLET,DELAYED RELEASE
DELAYED_RELEASE_TABLET | ORAL | Status: AC
Start: 2024-04-09 — End: 2024-04-09
  Filled 2024-04-09: qty 2

## 2024-04-09 MED ORDER — HALOPERIDOL 2 MG TABLET
2.0000 mg | ORAL_TABLET | Freq: Every day | ORAL | Status: DC | PRN
Start: 2024-04-09 — End: 2024-04-19
  Administered 2024-04-09: 2 mg via ORAL

## 2024-04-09 MED ORDER — BUSPIRONE 10 MG TABLET
ORAL_TABLET | ORAL | Status: AC
Start: 2024-04-09 — End: 2024-04-09
  Filled 2024-04-09: qty 1

## 2024-04-09 MED ORDER — BENZTROPINE 1 MG TABLET
1.0000 mg | ORAL_TABLET | Freq: Two times a day (BID) | ORAL | Status: DC
Start: 2024-04-09 — End: 2024-04-09
  Filled 2024-04-09 (×2): qty 1

## 2024-04-09 MED ORDER — HALOPERIDOL 2 MG TABLET
ORAL_TABLET | ORAL | Status: AC
Start: 2024-04-09 — End: 2024-04-09
  Filled 2024-04-09: qty 1

## 2024-04-09 MED ORDER — BENZTROPINE 1 MG/ML INJECTION SOLUTION
1.0000 mg | Freq: Every evening | INTRAMUSCULAR | Status: DC
Start: 2024-04-09 — End: 2024-04-10
  Administered 2024-04-09: 1 mg via INTRAMUSCULAR

## 2024-04-09 MED ORDER — CLONAZEPAM 0.5 MG TABLET
0.5000 mg | ORAL_TABLET | Freq: Two times a day (BID) | ORAL | Status: DC | PRN
Start: 2024-04-09 — End: 2024-04-19
  Administered 2024-04-09: 0.5 mg via ORAL

## 2024-04-09 MED ORDER — BUSPIRONE 10 MG TABLET
10.0000 mg | ORAL_TABLET | Freq: Three times a day (TID) | ORAL | Status: DC
Start: 2024-04-09 — End: 2024-04-10
  Administered 2024-04-09: 10 mg via ORAL
  Administered 2024-04-09: 0 mg via ORAL

## 2024-04-09 MED ORDER — CLONAZEPAM 0.5 MG TABLET
ORAL_TABLET | ORAL | Status: AC
Start: 2024-04-09 — End: 2024-04-09
  Filled 2024-04-09: qty 1

## 2024-04-09 MED ORDER — BENZTROPINE 1 MG/ML INJECTION SOLUTION
INTRAMUSCULAR | Status: AC
Start: 2024-04-09 — End: 2024-04-09
  Filled 2024-04-09: qty 2

## 2024-04-09 NOTE — ED Nurses Note (Signed)
 PT sleeping in bed with TV on and resp even and unlabored. No acute distress noted. PT is in direct view of nurses station, and has not caused any issues, and has been very cooperative since my arrival.

## 2024-04-09 NOTE — ED Nurses Note (Addendum)
 Offered patient for us  to try to arrange placement for a hotel at the [employer] so patient could go there instead of going back to his mothers house. Patient states he would rather just go back home. Patient states he is no longer allowed to stay at the [facility].

## 2024-04-09 NOTE — Discharge Instructions (Signed)
 Return if any concerns. If any thoughts of hurting yourself or others.

## 2024-04-09 NOTE — ED Provider Notes (Signed)
 [facility], [place] - Emergency Department  ED Primary Note  History of Present Illness   Donald Atkins is a 38 y.o. male who had concerns including Psychiatric Problem. Pt states he is under a lot of stress. States he needs to talk to someone. His mother upset him and his therapist told him to come here to get away from here denies si hi. States he doesn't think he needs in pt states he just needs someone to talk to . A therapist. .states he [facility] for his every 3rd week  haldol  shot  States he is not si not si. States he has been taking his med and doing good.   Physical Exam   ED Triage Vitals [04/09/24 1751]   BP (Non-Invasive) 139/75   Heart Rate 91   Respiratory Rate 16   Temperature 37.2 C (98.9 F)   SpO2 97 %   Weight 83.9 kg (185 lb)   Height 1.854 m (6' 1)     Constitutional:  38 y.o. male who appears in no distress. Normal color, no cyanosis.   HENT:   Head: Normocephalic and atraumatic.   Mouth/Throat: Oropharynx is clear and moist.   Eyes: EOMI, PERRL   Neck: Trachea midline. Neck supple.  Cardiovascular: RRR, No murmurs, rubs or gallops. Intact distal pulses.  Pulmonary/Chest: BS equal bilaterally. No respiratory distress. No wheezes, rales or chest tenderness.   Abdominal: Bowel sounds present and normal. Abdomen soft, no tenderness, no rebound and no guarding.  Back: No midline spinal tenderness, no paraspinal tenderness, no CVA tenderness.           Musculoskeletal: No edema, tenderness or deformity.  Skin: warm and dry. No rash, erythema, pallor or cyanosis  Psychiatric: normal mood and affect. Behavior is normal.   Neurological: Patient keenly alert and responsive, easily able to raise eyebrows, facial muscles/expressions symmetric, speaking in fluent sentences, moving all extremities equally and fully, normal gait  Patient Data   Labs Ordered/Reviewed   ACETAMINOPHEN  LEVEL - Abnormal; Notable for the following components:       Result Value     ACETAMINOPHEN  LEVEL 0.0 (*)     All other components within normal limits   CBC WITH DIFF - Abnormal; Notable for the following components:    RDW 16.9 (*)     All other components within normal limits   ETHANOL, SERUM/PLASMA - Normal   DRUG SCREEN, NO CONFIRMATION, URINE - Normal   SALICYLATE LEVEL - Normal   THYROID  STIMULATING HORMONE (SENSITIVE TSH) - Normal   URINALYSIS, MACRO/MICRO - Normal   CBC/DIFF    Narrative:     The following orders were created for panel order CBC/DIFF.  Procedure                               Abnormality         Status                     ---------                               -----------         ------                     CBC WITH IPQQ[[id number]]  Abnormal            Final result                 Please view results for these tests on the individual orders.   COMPREHENSIVE METABOLIC PANEL, NON-FASTING    Narrative:     Estimated Glomerular Filtration Rate (eGFR) is calculated using the CKD-EPI (2021) equation, intended for patients 28 years of age and older. If gender is not documented or unknown, there will be no eGFR calculation.   URINALYSIS WITH REFLEX MICROSCOPIC AND CULTURE IF POSITIVE    Narrative:     The following orders were created for panel order URINALYSIS WITH REFLEX MICROSCOPIC AND CULTURE IF POSITIVE.  Procedure                               Abnormality         Status                     ---------                               -----------         ------                     URINALYSIS, MACRO/MICRO[[id number]]      Normal              Final result                 Please view results for these tests on the individual orders.     No orders to display     Medical Decision Making   Diff dx .sitiation anxiety mood disorder.chronic depression anxiety   Risk Factors:    Anxiety (worry, apprehension, panic)  Protective Factors:    motivated for treatment  Suicide Inquiry:    Thoughts:   none   Plans/Behaviors/Past attempts:   ma  Intent:   None    Risk Level:      Based on my assessment, the patient is a low risk of suicide     Intervention:     LOW RISK: Patient has been found to be LOW RISK for suicide. Outpatient referral, symptom reduction strategies given. Patient given emergency/crisis numbers.    Documentation  Given the above risk level the following plan of care was developed:    Admission is not required due to no si ,has appt tomorrow.   1:1 observation was not required due to not si not hi.   Medication management - unchanged.   Behavioral health referral was offered and was accepted by the patient.  Contact with supportive persons in their life was encouraged. Patient identifies his father  as a person of trust and support.  Patient counseled by provider on Suicide prevention and provided with written information, crisis phone numbers, and resources.  [facility] up is [facility] .  Pt has an ipap tele consult with olga g. Np. She ordered his regular meds. She felt he can keep apt in am. Not si nor hi. Calm cooperative. Will keep pt here overnight , give him bus ticket to [facility] for appt. He agrees with plan.   2200 care left to dr. Pleas  pt essentially ready for DC but he was given a full report in the event  pt needed anything during night.   Medications Ordered/Administered in the ED   busPIRone (BUSPAR) tablet (has no administration in time range)   clonazePAM (klonoPIN) tablet (has no administration in time range)   divalproex  (DEPAKOTE ) 12 hr delayed release tablet (has no administration in time range)   haloperidol  (HALDOL ) tablet (has no administration in time range)     Clinical Impression   Situational anxiety (Primary)       Disposition: Discharged

## 2024-04-09 NOTE — ED Nurses Note (Addendum)
 Spoke with [place] on patients phone, she states she only has guardianship over patient in the state of [place] .

## 2024-04-09 NOTE — Consults (Signed)
 Psychiatric Consultation    Patient's Full Name: Donald Atkins   Patient's Date of Birth: 1986/01/05   Patient's Age: 38 y.o.   Patient's Legal Sex: male   Patient's MRN: [id number]   Patient's Date of Admission: 04/09/2024   Current Date: 04/09/2024 18:39     Patient's Room/Bed: ED11/ED11       Chief Complaint: My mom is stressing me out and I am trying to get away from her.      History of Present Illness:38 year old male with self reported  PTSD, schizophrenia, bipolar disorder, multiple psychiatric hospital (2 times this year, total of at least 6), homelessness and poor compliance with medication. This afternoon he is alert and oriented x3. Patient denies suicide ideation, homicidal ideation and audio/visual hallucinations. He states I am not going to do anything to my self or anything.. I don't need hospital.. [facility].. I am just doing what they told me to do.. my mom.. she doesn't want me there.. I am not hearing voices.. I am not suicidal.. I have been doing really good for 5 months.. my medicines are working.   .Case discussed with Elesa Holts NP  Review of Systems   Constitutional:  Denies pain.   HENT: Negative.     Eyes: Negative.    Respiratory: Negative.     Cardiovascular: Negative.    Gastrointestinal: Negative.    Genitourinary: Negative.    Musculoskeletal:  denies  myalgias.   Skin: Negative.    Neurological: Negative.    Psychiatric/Behavioral:  Positive for depression, anxiety,      Psychiatric History:    Past Diagnoses: self reported  PTSD, schizophrenia, bipolar disorder, multiple psychiatric hospital (2 times this year, total of at least 6), homelessness and poor compliance with medications     Treatment History:   History of Inpatient Psychiatric Treatment? Yes   History of Outpatient Psychiatric Treatment?yes       Substance Abuse History:    Alcohol Use:no   Any history of blackouts, DTs, withdrawal symptoms?    Drug Use: denies     Tobacco  Use: denies     Substance Abuse Comments:       Medications and Allergies:    Allergies:  Allergies[1]   (I.e. ACE Inhibitors, NSAIDs, ASA, Fish/Seafood, Latex, Nuts, Penicillin, Sulfa)    Current Psychiatric Medications: see below     Social History:    Living Situation: home with his mother     Marital Status:single     Highest Level of Education: 10th grade     State of Employment:unemployed       Vital Signs:    Filed Vitals:    04/09/24 1751   BP: 139/75   Pulse: 91   Resp: 16   Temp: 37.2 C (98.9 F)   SpO2: 97%        Labs:    Results for orders placed or performed during the hospital encounter of 04/09/24 (from the past 24 hours)   URINALYSIS WITH REFLEX MICROSCOPIC AND CULTURE IF POSITIVE    Collection Time: 04/09/24  5:54 PM    Specimen: Urine, Site not specified    Narrative    The following orders were created for panel order URINALYSIS WITH REFLEX MICROSCOPIC AND CULTURE IF POSITIVE.  Procedure                               Abnormality  Status                     ---------                               -----------         ------                     URINALYSIS, MACRO/MICRO[[id number]]                                                       Please view results for these tests on the individual orders.   CBC/DIFF    Collection Time: 04/09/24  6:11 PM    Narrative    The following orders were created for panel order CBC/DIFF.  Procedure                               Abnormality         Status                     ---------                               -----------         ------                     CBC WITH IPQQ[[id number]]                Abnormal            Final result                 Please view results for these tests on the individual orders.   CBC WITH DIFF    Collection Time: 04/09/24  6:11 PM   Result Value Ref Range    WBC 6.0 3.6 - 10.2 x10^3/uL    RBC 5.09 4.06 - 5.63 x10^6/uL    HGB 14.8 12.5 - 16.3 g/dL    HCT 55.2 63.2 - 52.8 %    MCV 87.8 73.0 - 96.2 fL    MCH 29.1 23.8 - 33.4 pg    MCHC  33.2 32.5 - 36.3 g/dL    RDW 83.0 (H) 87.8 - 16.2 %    PLATELETS 268 140 - 440 x10^3/uL    MPV 7.9 7.4 - 11.4 fL    NEUTROPHIL % 62 44 - 74 %    LYMPHOCYTE % 31 15 - 43 %    MONOCYTE % 6 6 - 14 %    EOSINOPHIL % 1 1 - 8 %    BASOPHIL % 0 0 - 1 %    NEUTROPHIL # 3.72 1.85 - 7.84 x10^3/uL    LYMPHOCYTE # 1.83 1.00 - 3.00 x10^3/uL    MONOCYTE # 0.37 0.30 - 1.00 x10^3/uL    EOSINOPHIL # 0.05 0.00 - 0.60 x10^3/uL    BASOPHIL # 0.02 0.00 - 0.10 x10^3/uL        Physical Exam:     Please see dictated hospitalist note    Current Facility-Administered Medications   Medication Dose Route Frequency  benztropine  (COGENTIN ) tablet  1 mg Oral 2x/day    busPIRone (BUSPAR) tablet  10 mg Oral 3x/day    clonazePAM (klonoPIN) tablet  0.5 mg Oral 2x/day PRN    divalproex  (DEPAKOTE ) 12 hr delayed release tablet  500 mg Oral 2x/day    haloperidol  (HALDOL ) tablet  2 mg Oral Daily PRN        Mental Status Examination:  .Sensorium/Alertness: Alert,   Orientation: Date, Person, Place, Situation  Appearance:Appears stated age,   Psychomotor Activity: Normal,   Abnormal Behaviors: None, mild child like   Attitude Towards Examiner: Attentive, Cooperative,  Eye Contact: Normal,   Speech: Normal/Spontaneous,    Mood: Anxious, Depressed,   Affect: Appropriate,   Perception: WNL  Though Process: Logical/Clear/Goal Oriented,  Thought Content: Suicidal? Denies   Thought Content: Homicidal? no  Thought Content: Delusions? no  Impulse Control:  Grossly Intact,   Concentration/Calculation/Attention Span: WNL,  How was the patient's Concentration/Calculation/Attention tested/assessed? Per observation and interview with patient   Recent Memory: WNL,  Remote Memory: WNL,   How was the patient's Remote Memory Tested/Assessed? Past Events, as it relates to history  Intelligence/Fund of Knowledge: Average  How was the patient's Intelligence/Fund of Knowledge Tested/Assessed? Based on history, Based on vocabulary, syntax, grammar, and content  Judgement:  Fair,   How was the patient's Judgement Tested/Assessed? Per patient's behavior/history of present illness  Insight: Fair,   How was the patient's Insight Tested/Assessed? Understanding of severity of illness/history of present illness       Discussed with family/guardian/significant other:no      Clinical Conclusion:    Overall Impression:38 year old male with self reported  PTSD, schizophrenia, bipolar disorder, multiple psychiatric hospital (2 times this year, total of at least 6), homelessness and poor compliance with medication. He is not suicidal and not psychotic. He can follow up with his psychiatrist tomorrow.     Diagnoses:   Active Hospital Problems    Diagnosis    Schizoaffective disorder [F25.9]         Treatment and Medication Recommendation:    [facility] records from 04/09/24 reviewed including labs.   CBC: WNL    He was at the [facility] 7/12-7/18/25; records reviewed.     Continue with home medications;   Buspar 10 mg PO TID For anxiety   PRN Klonopin 0.5 mg q 12 hrs PRN anxiety   Depakote  500 mg PO BID for mood  PRN Haldol  2 mg daily PRN agitation  He is on monthly Haldol  Decanoate 150 mg. He last picked it up from pharmacy per dispense report 03/25/24    .-if not already established with an outpatient psychiatric provider, please make outpatient appointment in their area of residence prior to discharge, if indicated.        Interaction Attestation: Clinical telemedicine services delivered using HIPAA-compliant interactive video-audio telecommunications while the patient and the rendering provider were not in the same physical location. Patient agreeable to telecommunication    TELEMEDICINE DOCUMENTATION:    Patient Location:  [facility], [address], [place], [place] [address]   Provider Location: Remote  Patient/family aware of provider location:  yes  Patient/family consent for telemedicine:  yes  Examination observed and performed by:  .Ezella Sanders, PMHNP    ATTENDING  ATTESTATION:  The NP evaluated the patient and provided care. I have reviewed the note. I agree with their findings and at this time have no amendments to the current plan.     Prentice ROCKFORD Quin,  DO  Psychiatrist  Medical Director  [facility], [facility]       [1]   Allergies  Allergen Reactions    Latex     Penicillins Anaphylaxis    Latex

## 2024-04-09 NOTE — ED Nurses Note (Signed)
 Patient speaking with [facility] at [facility] via [facility]psych

## 2024-04-09 NOTE — ED Nurses Note (Addendum)
 Patient reports he has had increased stress and needs to speak with somebody. Patient reports he lives with his mother and she has caused him a lot of stress and needed to get away from her. Patient denies being SI/HI. Patient reports he does not want inpatient treatment and just wants to speak with a therapist. Patient denies any other complaints to nurse

## 2024-04-09 NOTE — ED Nurses Note (Signed)
 Attempted to call [employer] for help with bus ticket to [place], they state there is nobody there tonight that will be able to help.

## 2024-04-09 NOTE — ED Nurses Note (Signed)
 Patient informed of plan of care, patient agreeable to plan. Patient given sandwich tray and drink per request.

## 2024-04-10 ENCOUNTER — Ambulatory Visit (HOSPITAL_PSYCHIATRIC): Payer: Self-pay

## 2024-04-10 ENCOUNTER — Ambulatory Visit: Payer: MEDICAID

## 2024-04-10 ENCOUNTER — Encounter (HOSPITAL_PSYCHIATRIC): Payer: Self-pay

## 2024-04-10 VITALS — Resp 18 | Ht 73.0 in | Wt 184.0 lb

## 2024-04-10 DIAGNOSIS — F411 Generalized anxiety disorder: Secondary | ICD-10-CM | POA: Insufficient documentation

## 2024-04-10 DIAGNOSIS — F251 Schizoaffective disorder, depressive type: Secondary | ICD-10-CM | POA: Insufficient documentation

## 2024-04-10 MED ORDER — CLONAZEPAM 0.5 MG TABLET: 0.5000 mg | ORAL_TABLET | Freq: Two times a day (BID) | ORAL | 0 refills | Status: DC | PRN

## 2024-04-10 MED ORDER — DIVALPROEX 500 MG TABLET,DELAYED RELEASE: 500.0000 mg | DELAYED_RELEASE_TABLET | Freq: Two times a day (BID) | ORAL | 0 refills | Status: DC

## 2024-04-10 MED ORDER — HALOPERIDOL DECANOATE 100 MG/ML INTRAMUSCULAR SOLUTION
100.0000 mg | INTRAMUSCULAR | 0 refills | Status: AC
Start: 2024-04-10 — End: ?

## 2024-04-10 MED ORDER — BENZTROPINE 1 MG TABLET: 1.0000 mg | ORAL_TABLET | Freq: Three times a day (TID) | ORAL | 0 refills | Status: DC

## 2024-04-10 MED ORDER — HYDROXYZINE PAMOATE 25 MG CAPSULE: 25.0000 mg | ORAL_CAPSULE | Freq: Four times a day (QID) | ORAL | 0 refills | Status: DC | PRN

## 2024-04-10 MED ORDER — HALOPERIDOL DECANOATE 50 MG/ML INTRAMUSCULAR SOLUTION
50.0000 mg | INTRAMUSCULAR | 0 refills | Status: AC
Start: 2024-04-10 — End: ?

## 2024-04-10 MED ORDER — HALOPERIDOL 2 MG TABLET: 2.0000 mg | ORAL_TABLET | Freq: Every day | ORAL | 0 refills | Status: DC | PRN

## 2024-04-10 MED ORDER — BENZTROPINE 0.5 MG TABLET: 1.0000 mg | ORAL_TABLET | Freq: Two times a day (BID) | ORAL | 0 refills | Status: DC

## 2024-04-10 MED ORDER — BUSPIRONE 10 MG TABLET: 10.0000 mg | ORAL_TABLET | Freq: Three times a day (TID) | ORAL | 0 refills | Status: DC

## 2024-04-10 NOTE — ED Attending Handoff Note (Signed)
 MDM:    ED Course as of 04/10/24 0805   Wed Apr 10, 2024   0804 No acute events overnight patient was discharged time to ensure outpatient follow up.      Discharged  Clinical Impression   Situational anxiety (Primary)     Medications Ordered/Administered in the ED   busPIRone (BUSPAR) tablet (has no administration in time range)   clonazePAM (klonoPIN) tablet (has no administration in time range)   divalproex  (DEPAKOTE ) 12 hr delayed release tablet (has no administration in time range)   haloperidol  (HALDOL ) tablet (has no administration in time range)        Current Discharge Medication List        CONTINUE these medications - NO CHANGES were made during your visit.        Details   * benztropine  1 mg Tablet  Commonly known as: COGENTIN    1 mg, Oral, 3 TIMES DAILY  Qty: 90 Tablet  Refills: 0     Blunt Needle, Disposable 18 x 1 1/2  Needle   2 Each, Does not apply, EVERY 30 DAYS  Qty: 2 Each  Refills: 0     busPIRone 10 mg Tablet  Commonly known as: BUSPAR   10 mg, Oral, 3 TIMES DAILY  Qty: 90 Tablet  Refills: 0     clonazePAM 0.5 mg Tablet  Commonly known as: klonoPIN   0.5 mg, Oral, 2 TIMES DAILY PRN  Qty: 60 Tablet  Refills: 0     divalproex  500 mg Tablet, Delayed Release (E.C.)  Commonly known as: DEPAKOTE    500 mg, Oral, 2 TIMES DAILY  Qty: 60 Tablet  Refills: 0     haloperidoL  2 mg Tablet  Commonly known as: HALDOL    2 mg, Oral, DAILY PRN  Qty: 30 Tablet  Refills: 0     * haloperidol  decanoate 100 mg/mL Solution  Commonly known as: HALDOL  DECANOATE   100 mg, IntraMUSCULAR, EVERY 30 DAYS, To be given with 50 mg to equal 150mg  monthly  Qty: 1 mL  Refills: 0     * haloperidol  decanoate 50 mg/mL Solution  Commonly known as: HaldoL  Decanoate   50 mg, IntraMUSCULAR, EVERY 30 DAYS, To be given with 100 mg to equal 150mg  monthly  Qty: 1 mL  Refills: 0     hydrOXYzine  pamoate 25 mg Capsule  Commonly known as: VISTARIL    25 mg, Oral, 4 TIMES DAILY PRN  Qty: 120 Capsule  Refills: 0     montelukast 10 mg  Tablet  Commonly known as: SINGULAIR   10 mg, NIGHTLY  Refills: 0     pantoprazole  40 mg Tablet, Delayed Release (E.C.)  Commonly known as: PROTONIX    40 mg, Daily  Refills: 0     Syringe with Needle (Disp) 3 mL 21 gauge x 1 1/2 Syringe  Commonly known as: Syringe 3cc/21Gx1-1/2   2 Each, Does not apply, EVERY 30 DAYS  Qty: 2 Each  Refills: 0           * This list has 3 medication(s) that are the same as other medications prescribed for you. Read the directions carefully, and ask your doctor or other care provider to review them with you.                ASK your doctor about these medications.        Details   * benztropine  0.5 mg Tablet  Commonly known as: COGENTIN   Ask about: Should I take this  medication?   1 mg, Oral, 2 TIMES DAILY  Qty: 120 Tablet  Refills: 0           * This list has 1 medication(s) that are the same as other medications prescribed for you. Read the directions carefully, and ask your doctor or other care provider to review them with you.

## 2024-04-10 NOTE — ED Nurses Note (Signed)
 No distress noted. Discharge instructions given to the pt. Left ambulatory to the waiting room to catch a bus.

## 2024-04-10 NOTE — ED Nurses Note (Signed)
 Patient resting in bed with eyes closed, respirations even and unlabored with equal chest rise and fall. No acute signs of distress noted at this time.

## 2024-04-10 NOTE — Progress Notes (Signed)
 BEHAVIORAL MEDICINE, BE[facility]AVIORAL [facility]ALTH C[facility]  [address]  [place] [address]  Operated by [facility]    Name: Donald Atkins MRN:  [id number]   Date: 04/10/2024 DOB:  1985/08/01 (38 y.o.)       Chief Complaint: Generalized Anxiety and Bipolar Disorder    Subjective:   Patient reports that his mother kicked him out of his house and he has not had his medications in 2 days. Supposed to receive injection today but does not have it. Nursing attempted to contact mother to get his medication. She did not answer the phone. A voicemail was left. This provider also attempted to contact mother with no answer. Patient reports that he tried [place] sleep [place] bridge last night. The police took him to the ER. Plans to try to stay at the [facility].   Court went good. Postponed for 3 months.    Mood Good  Medications Working well. No ill effects.  Appetite Good  Sleep Good. Last night he attempted to sleep on the side walk.  Energy So so.  Stressors Issue with mom.    Problem List[1]  Past Medical History[2]  Past Surgical History[3]  Family History[4]  Social History     Socioeconomic History    Marital status: Unknown   Tobacco Use    Smoking status: Never     Passive exposure: Never    Smokeless tobacco: Never   Vaping Use    Vaping status: Never Used   Substance and Sexual Activity    Alcohol use: Never    Drug use: Never    Sexual activity: Not Currently     Social Determinants of Health     Financial Resource Strain: Low Risk (05/16/2022)    Financial Resource Strain     SDOH Financial: No   Transportation Needs: No Transportation Needs (02/27/2024)    Received from [facility] - Transportation     In the past 12 months, has lack of transportation kept you from medical appointments or from getting medications?: No     In the past 12 months, has lack of transportation kept you from meetings, work, or from getting things needed for daily living?: No   Social  Connections: Medium Risk (12/23/2023)    Social Connections     SDOH Social Isolation: 3 to 5 times a week   Intimate Partner Violence: Not At Risk (02/27/2024)    Received from [facility]    Humiliation, Afraid, Rape, and Kick questionnaire     Within the last year, have you been afraid of your partner or ex-partner?: No     Within the last year, have you been humiliated or emotionally abused in other ways by your partner or ex-partner?: No     Within the last year, have you been kicked, hit, slapped, or otherwise physically hurt by your partner or ex-partner?: No     Within the last year, have you been raped or forced to have any kind of sexual activity by your partner or ex-partner?: No   Housing Stability: Unknown (02/27/2024)    Received from [facility] Stability Vital Sign     In the last 12 months, was there a time when you were not able to pay the mortgage or rent on time?: No     At any time in the past 12 months, were you homeless or living in a shelter (including now)?: No  Latex, Penicillins, and Latex   Current Outpatient Medications   Medication Sig    benztropine  (COGENTIN ) 1 mg Oral Tablet Take 1 Tablet (1 mg total) by mouth Three times a day for 30 days    Blunt Needle, Disposable 18 x 1 1/2  Needle 2 Each Every 30 days    busPIRone (BUSPAR) 10 mg Oral Tablet Take 1 Tablet (10 mg total) by mouth Three times a day    clonazePAM (KLONOPIN) 0.5 mg Oral Tablet Take 1 Tablet (0.5 mg total) by mouth Twice per day as needed for up to 30 days    divalproex  (DEPAKOTE ) 500 mg Oral Tablet, Delayed Release (E.C.) Take 1 Tablet (500 mg total) by mouth Twice daily for 30 days    haloperidoL  (HALDOL ) 2 mg Oral Tablet Take 1 Tablet (2 mg total) by mouth Once per day as needed for Other for up to 30 days    haloperidol  decanoate (HALDOL  DECANOATE) 100 mg/mL IntraMUSCULAR Solution Inject 1 mL (100 mg total) into the muscle Every 30 days To be given with 50 mg to equal 150mg  monthly     haloperidol  decanoate (HALDOL  DECANOATE) 50 mg/mL IntraMUSCULAR Solution Inject 1 mL (50 mg total) into the muscle Every 30 days To be given with 100 mg to equal 150mg  monthly    hydrOXYzine  pamoate (VISTARIL ) 25 mg Oral Capsule Take 1 Capsule (25 mg total) by mouth Four times a day as needed for Anxiety    montelukast (SINGULAIR) 10 mg Oral Tablet Take 1 Tablet (10 mg total) by mouth Every night    pantoprazole  (PROTONIX ) 40 mg Oral Tablet, Delayed Release (E.C.) Take 1 Tablet (40 mg total) by mouth Daily    Syringe with Needle, Disp, (SYRINGE 3CC/21GX1-1/2) 3 mL 21 gauge x 1 1/2 Syringe 2 Each Every 30 days        Objective :  Resp 18   Ht 1.854 m (6' 1)   Wt 83.5 kg (184 lb)   BMI 24.28 kg/m       PHQ Total Score  PHQ 2 Total: 4  PHQ 9 Total: 15  Interpretation of Total Score: 15-19 Moderate/Severe depression             03/01/2024     8:37 AM 03/20/2024     8:58 AM 04/10/2024     8:57 AM   Most Recent PHQ-9 Scores   PHQ 9 Total 0 7 15           Mental Status Exam  AXOX4. Casual dress, calm, well-groomed. No SI, HI, AVH, delusions, or paranoia. Thoughts are logical, coherent, and goal-directed. Good eye contact. Speech is normal in rate and tone. Mood is okay affect congruent. No psychomotor agitation or retardation, cogwheel rigidity, or abnormal movements. Gait is normal. Attention, concentration, and memory are good. No cognitive deficits noted. Judgment and insight are fair. Calculation and abstraction are within normal limits.     Data Reviewed  I have reviewed patient's previous note medical, surgical, family, and social history in detail today,     Assessment  Generalized Anxiety and Schizoaffective Disorder     Plan  Hydroxyzine  Pamoate 25 mg take 1 cap by mouth four times daily as needed for anxiety  Divalproex  DR 500 mg take 1 tab by mouth twice daily for mood stabilization  Haloperidol  2 mg take 1 tab by mouth daily as needed for hallucinations  Haloperidol  Decanoate 50 mg/ml Administer 150 mg  IM every 21 days Due 04/10/24  Benztropine  1  mg by mouth three times daily for side effects of Haloperidol .  Buspirone 10 mg take 1 tab by mouth three times daily for increased anxiety.  Klonopin 0.5 mg take 1 tab by mouth twice daily as needed for anxiety.      Follow up  Follow up in 3 weeks     [name], APRN, CNP        [1]   Patient Active Problem List  Diagnosis    Schizophrenia    GAD (generalized anxiety disorder)    Schizoaffective disorder   [2]   Past Medical History:  Diagnosis Date    Bipolar disorder, unspecified     Borderline intellectual functioning     Convulsions     Convulsions     GAD (generalized anxiety disorder)     GERD (gastroesophageal reflux disease)     Mixed hyperlipidemia     Schizoaffective disorder     Schizophrenia    [3] No past surgical history on file.  [4] No family history on file.

## 2024-04-10 NOTE — ED Nurses Note (Signed)
 PT still sleeping in bed with resp even and unlabored. Sit up and drank po fluids, and layed back down. No acute distress noted. Lights dimmed and TV still on for pt comfort.

## 2024-04-10 NOTE — ED Nurses Note (Signed)
 Pt given oatmeal and po fluids at this time. Denies any acute needs. Awaiting the bus this am for transport to [facility] for outpt appt.

## 2024-04-16 ENCOUNTER — Other Ambulatory Visit (HOSPITAL_PSYCHIATRIC): Payer: Self-pay

## 2024-04-16 MED ORDER — BLUNT NEEDLE, DISPOSABLE 18 X 1 1/2": 2.0000 | 0 refills | Status: DC

## 2024-04-16 MED ORDER — HALOPERIDOL DECANOATE 50 MG/ML INTRAMUSCULAR SOLUTION: 50.0000 mg | INTRAMUSCULAR | 0 refills | Status: DC

## 2024-04-16 MED ORDER — HALOPERIDOL DECANOATE 100 MG/ML INTRAMUSCULAR SOLUTION: 100.0000 mg | INTRAMUSCULAR | 0 refills | Status: DC

## 2024-04-16 MED ORDER — SYRINGE WITH NEEDLE 3 ML 21 GAUGE X 1 1/2": 2.0000 | INJECTION | 0 refills | Status: DC

## 2024-05-01 ENCOUNTER — Ambulatory Visit (HOSPITAL_PSYCHIATRIC): Payer: Self-pay

## 2024-05-02 ENCOUNTER — Ambulatory Visit: Payer: MEDICAID

## 2024-05-02 ENCOUNTER — Ambulatory Visit (HOSPITAL_PSYCHIATRIC): Payer: MEDICAID

## 2024-05-03 ENCOUNTER — Ambulatory Visit (HOSPITAL_PSYCHIATRIC): Payer: Self-pay

## 2024-05-14 ENCOUNTER — Encounter (HOSPITAL_PSYCHIATRIC): Payer: Self-pay

## 2024-05-14 ENCOUNTER — Other Ambulatory Visit: Payer: Self-pay

## 2024-05-14 ENCOUNTER — Ambulatory Visit: Payer: MEDICAID

## 2024-05-14 VITALS — BP 141/87 | HR 88 | Resp 18 | Ht 73.0 in | Wt 184.0 lb

## 2024-05-14 DIAGNOSIS — F259 Schizoaffective disorder, unspecified: Secondary | ICD-10-CM

## 2024-05-14 DIAGNOSIS — Z79899 Other long term (current) drug therapy: Secondary | ICD-10-CM

## 2024-05-14 DIAGNOSIS — F251 Schizoaffective disorder, depressive type: Secondary | ICD-10-CM

## 2024-05-14 DIAGNOSIS — F411 Generalized anxiety disorder: Secondary | ICD-10-CM

## 2024-05-14 MED ORDER — HALOPERIDOL 2 MG TABLET: 2.0000 mg | ORAL_TABLET | Freq: Every day | ORAL | 0 refills | Status: DC | PRN

## 2024-05-14 MED ORDER — CLONAZEPAM 0.5 MG TABLET: 0.5000 mg | ORAL_TABLET | Freq: Two times a day (BID) | ORAL | 0 refills | Status: DC | PRN

## 2024-05-14 MED ORDER — HALOPERIDOL DECANOATE 50 MG/ML INTRAMUSCULAR SOLUTION: 50.0000 mg | INTRAMUSCULAR | 0 refills | Status: DC

## 2024-05-14 MED ORDER — DIVALPROEX 500 MG TABLET,DELAYED RELEASE: 500.0000 mg | DELAYED_RELEASE_TABLET | Freq: Two times a day (BID) | ORAL | 0 refills | Status: DC

## 2024-05-14 MED ORDER — BUSPIRONE 10 MG TABLET: 10.0000 mg | ORAL_TABLET | Freq: Three times a day (TID) | ORAL | 0 refills | Status: DC

## 2024-05-14 MED ORDER — HALOPERIDOL DECANOATE 100 MG/ML INTRAMUSCULAR SOLUTION: 100.0000 mg | INTRAMUSCULAR | 0 refills | Status: DC

## 2024-05-14 MED ORDER — BENZTROPINE 1 MG TABLET: 1.0000 mg | ORAL_TABLET | Freq: Three times a day (TID) | ORAL | 0 refills | Status: DC

## 2024-05-14 NOTE — Progress Notes (Signed)
 BEHAVIORAL MEDICINE, BEHAVIORAL HEALTH CENTER  [address]  [place] [address]  Operated by [facility]    Name: Donald Atkins MRN:  [id number]   Date: 05/14/2024 DOB:  [birth year] (38 y.o.)       Chief Complaint: Generalized Anxiety    Subjective:   Patient reports that he had been hospitalized in [place] recently.Patient states that he went to [facility] mostly to get my shot. At this time patient had been kicked out of his mom's house and could not get his medicine from her.  Patient reports that things are going better. Getting along better with his mom. Ate Thanksgiving at home. Plans to have Christmas dinner with his family at [facility].    Mood Good  Medications Working well. No ill effects.  Appetite Good  Sleep Good  Energy A lot of energy, which is normal.  Stressors Denies    Problem List[1]  Past Medical History[2]  Past Surgical History[3]  Family History[4]  Social History     Socioeconomic History    Marital status: Unknown   Tobacco Use    Smoking status: Never     Passive exposure: Never    Smokeless tobacco: Never   Vaping Use    Vaping status: Never Used   Substance and Sexual Activity    Alcohol use: Never    Drug use: Never    Sexual activity: Not Currently     Social Determinants of Health     Financial Resource Strain: Low Risk (05/16/2022)    Financial Resource Strain     SDOH Financial: No   Transportation Needs: No Transportation Needs (04/28/2024)    Received from [facility] - Transportation     In the past 12 months, has lack of transportation kept you from medical appointments or from getting medications?: No     In the past 12 months, has lack of transportation kept you from meetings, work, or from getting things needed for daily living?: No   Social Connections: Medium Risk (12/23/2023)    Social Connections     SDOH Social Isolation: 3 to 5 times a week   Intimate Partner Violence: Not At Risk (04/28/2024)    Received from [facility]     Humiliation, Afraid, Rape, and Kick questionnaire     Within the last year, have you been afraid of your partner or ex-partner?: No     Within the last year, have you been humiliated or emotionally abused in other ways by your partner or ex-partner?: No     Within the last year, have you been kicked, hit, slapped, or otherwise physically hurt by your partner or ex-partner?: No     Within the last year, have you been raped or forced to have any kind of sexual activity by your partner or ex-partner?: No   Housing Stability: Unknown (04/28/2024)    Received from [facility] Stability Vital Sign     In the last 12 months, was there a time when you were not able to pay the mortgage or rent on time?: No     At any time in the past 12 months, were you homeless or living in a shelter (including now)?: No      Latex, Penicillins, and Latex   Current Outpatient Medications   Medication Sig    benztropine  (COGENTIN ) 0.5 mg Oral Tablet Take 2 Tablets (1 mg total) by mouth Twice daily for 30 days (Patient  not taking: Reported on 05/14/2024)    benztropine  (COGENTIN ) 1 mg Oral Tablet Take 1 Tablet (1 mg total) by mouth Three times a day for 30 days    Blunt Needle, Disposable 18 x 1 1/2  Needle 2 Each Every 30 days    busPIRone  (BUSPAR ) 10 mg Oral Tablet Take 1 Tablet (10 mg total) by mouth Three times a day    clonazePAM  (KLONOPIN ) 0.5 mg Oral Tablet Take 1 Tablet (0.5 mg total) by mouth Twice per day as needed for up to 30 days (Patient not taking: Reported on 05/14/2024)    divalproex  (DEPAKOTE ) 500 mg Oral Tablet, Delayed Release (E.C.) Take 1 Tablet (500 mg total) by mouth Twice daily for 30 days    haloperidoL  (HALDOL ) 2 mg Oral Tablet Take 1 Tablet (2 mg total) by mouth Once per day as needed for Other for up to 30 days    haloperidol  decanoate (HALDOL  DECANOATE) 100 mg/mL IntraMUSCULAR Solution Inject 1 mL (100 mg total) into the muscle Every 30 days To be given with 50 mg to equal 150mg  monthly     haloperidol  decanoate (HALDOL  DECANOATE) 50 mg/mL IntraMUSCULAR Solution Inject 1 mL (50 mg total) into the muscle Every 30 days To be given with 100 mg to equal 150mg  monthly    hydrOXYzine  pamoate (VISTARIL ) 25 mg Oral Capsule Take 1 Capsule (25 mg total) by mouth Four times a day as needed for Anxiety    montelukast (SINGULAIR) 10 mg Oral Tablet Take 1 Tablet (10 mg total) by mouth Every night    pantoprazole  (PROTONIX ) 40 mg Oral Tablet, Delayed Release (E.C.) Take 1 Tablet (40 mg total) by mouth Daily    Syringe with Needle, Disp, (SYRINGE 3CC/21GX1-1/2) 3 mL 21 gauge x 1 1/2 Syringe 2 Each Every 30 days        Objective :  BP (!) 141/87   Pulse 88   Resp 18   Ht 1.854 m (6' 1)   Wt 83.5 kg (184 lb)   BMI 24.28 kg/m       PHQ Total Score  PHQ 2 Total: 0  PHQ 9 Total: 0  Interpretation of Total Score: 0-4 No depression             03/20/2024     8:58 AM 04/10/2024     8:57 AM 05/14/2024    10:43 AM   Most Recent PHQ-9 Scores   PHQ 9 Total 7 15 0           Mental Status Exam  AXOX4. Casual dress, calm, well-groomed. No SI, HI, AVH, delusions, or paranoia. Thoughts are logical, coherent, and goal-directed. Good eye contact. Speech is normal in rate and tone. Mood is okay affect congruent. No psychomotor agitation or retardation, cogwheel rigidity, or abnormal movements. Gait is normal. Attention, concentration, and memory are good. No cognitive deficits noted. Judgment and insight are fair. Calculation and abstraction are within normal limits.     Data Reviewed  I have reviewed patient's previous note medical, surgical, family, and social history in detail today,     Assessment  Generalized Anxiety and Schizoaffective Disorder     Plan  Hydroxyzine  Pamoate 25 mg take 1 cap by mouth four times daily as needed for anxiety  Divalproex  DR 500 mg take 1 tab by mouth twice daily for mood stabilization  Haloperidol  2 mg take 1 tab by mouth daily as needed for hallucinations  Haloperidol  Decanoate 50 mg/ml  Administer 150 mg IM every 21  days Due 04/10/24  Benztropine  1 mg by mouth three times daily for side effects of Haloperidol .  Buspirone  10 mg take 1 tab by mouth three times daily for increased anxiety.  Klonopin  0.5 mg take 1 tab by mouth twice daily as needed for anxiety.      Follow up  Follow up in 3 weeks     [name], APRN, CNP        [1]   Patient Active Problem List  Diagnosis    Schizophrenia    GAD (generalized anxiety disorder)    Schizoaffective disorder   [2]   Past Medical History:  Diagnosis Date    Bipolar disorder, unspecified     Borderline intellectual functioning     Convulsions     Convulsions     GAD (generalized anxiety disorder)     GERD (gastroesophageal reflux disease)     Mixed hyperlipidemia     Schizoaffective disorder     Schizophrenia    [3] No past surgical history on file.  [4] No family history on file.

## 2024-05-23 ENCOUNTER — Ambulatory Visit (HOSPITAL_PSYCHIATRIC): Payer: Self-pay

## 2024-05-24 ENCOUNTER — Ambulatory Visit (HOSPITAL_PSYCHIATRIC): Payer: MEDICAID

## 2024-05-27 ENCOUNTER — Ambulatory Visit: Payer: Self-pay

## 2024-05-27 ENCOUNTER — Other Ambulatory Visit: Payer: Self-pay

## 2024-05-27 DIAGNOSIS — F259 Schizoaffective disorder, unspecified: Secondary | ICD-10-CM

## 2024-05-27 MED ORDER — HALOPERIDOL DECANOATE 50 MG/ML INTRAMUSCULAR SOLUTION: 50.0000 mg | INTRAMUSCULAR | 0 refills | Status: DC

## 2024-05-27 MED ORDER — HALOPERIDOL DECANOATE 50 MG/ML INTRAMUSCULAR SOLUTION
50.0000 mg | INTRAMUSCULAR | Status: DC
  Administered 2024-05-27 – 2024-07-12 (×2): 50 mg via INTRAMUSCULAR

## 2024-05-27 MED ORDER — HALOPERIDOL DECANOATE 100 MG/ML INTRAMUSCULAR SOLUTION: 100.0000 mg | INTRAMUSCULAR | 0 refills | Status: DC

## 2024-05-27 MED ORDER — HALOPERIDOL DECANOATE 100 MG/ML INTRAMUSCULAR SOLUTION
100.0000 mg | Freq: Once | INTRAMUSCULAR | Status: AC
  Administered 2024-05-27: 100 mg via INTRAMUSCULAR

## 2024-05-27 NOTE — Progress Notes (Signed)
 Per patient request 100 mg given in left arm by Nathanel Rooks RN. 50 mg given in Left arm by [facility] LPN. Patient tolerated medication well. To return in 21 days for next injections. Patient supplied medication Do not Bill.

## 2024-06-11 ENCOUNTER — Ambulatory Visit: Payer: Self-pay

## 2024-06-11 ENCOUNTER — Other Ambulatory Visit (HOSPITAL_PSYCHIATRIC): Payer: Self-pay

## 2024-06-11 MED ORDER — HALOPERIDOL DECANOATE 100 MG/ML INTRAMUSCULAR SOLUTION: 100.0000 mg | INTRAMUSCULAR | 1 refills | Status: DC

## 2024-06-11 MED ORDER — HALOPERIDOL DECANOATE 50 MG/ML INTRAMUSCULAR SOLUTION: 50.0000 mg | INTRAMUSCULAR | 1 refills | Status: DC

## 2024-06-11 MED ORDER — BLUNT NEEDLE, DISPOSABLE 18 X 1 1/2": 2.0000 | 1 refills | Status: DC

## 2024-06-11 MED ORDER — SYRINGE WITH NEEDLE 3 ML 21 GAUGE X 1 1/2": 2.0000 | INJECTION | 1 refills | Status: DC

## 2024-06-11 NOTE — Telephone Encounter (Signed)
 Pt request for refill of haldol  for injection.

## 2024-06-17 ENCOUNTER — Other Ambulatory Visit (HOSPITAL_PSYCHIATRIC): Payer: Self-pay

## 2024-06-17 ENCOUNTER — Encounter (HOSPITAL_PSYCHIATRIC): Payer: Self-pay

## 2024-06-17 ENCOUNTER — Ambulatory Visit (HOSPITAL_BASED_OUTPATIENT_CLINIC_OR_DEPARTMENT_OTHER): Payer: MEDICAID

## 2024-06-17 ENCOUNTER — Other Ambulatory Visit: Payer: Self-pay

## 2024-06-17 ENCOUNTER — Ambulatory Visit: Payer: MEDICAID

## 2024-06-17 VITALS — BP 135/88 | HR 75 | Resp 18 | Ht 73.0 in | Wt 185.0 lb

## 2024-06-17 DIAGNOSIS — F411 Generalized anxiety disorder: Secondary | ICD-10-CM | POA: Insufficient documentation

## 2024-06-17 DIAGNOSIS — F209 Schizophrenia, unspecified: Secondary | ICD-10-CM | POA: Insufficient documentation

## 2024-06-17 DIAGNOSIS — F251 Schizoaffective disorder, depressive type: Secondary | ICD-10-CM | POA: Insufficient documentation

## 2024-06-17 DIAGNOSIS — F259 Schizoaffective disorder, unspecified: Secondary | ICD-10-CM | POA: Insufficient documentation

## 2024-06-17 MED ORDER — HYDROXYZINE PAMOATE 25 MG CAPSULE: 25.0000 mg | ORAL_CAPSULE | Freq: Four times a day (QID) | ORAL | 0 refills | Status: DC | PRN

## 2024-06-17 MED ORDER — BUSPIRONE 10 MG TABLET: 10.0000 mg | ORAL_TABLET | Freq: Three times a day (TID) | ORAL | 0 refills | Status: DC

## 2024-06-17 MED ORDER — SYRINGE WITH NEEDLE 3 ML 21 GAUGE X 1 1/2": 2.0000 | INJECTION | 1 refills | Status: DC

## 2024-06-17 MED ORDER — HALOPERIDOL 2 MG TABLET: 2.0000 mg | ORAL_TABLET | Freq: Every day | ORAL | 0 refills | Status: DC | PRN

## 2024-06-17 MED ORDER — BLUNT NEEDLE, DISPOSABLE 18 X 1 1/2": 2.0000 | 1 refills | Status: DC

## 2024-06-17 MED ORDER — HALOPERIDOL DECANOATE 100 MG/ML INTRAMUSCULAR SOLUTION: 100.0000 mg | INTRAMUSCULAR | 1 refills | Status: DC

## 2024-06-17 MED ORDER — HALOPERIDOL DECANOATE 50 MG/ML INTRAMUSCULAR SOLUTION
100.0000 mg | INTRAMUSCULAR | Status: DC
  Administered 2024-06-17: 50 mg via INTRAMUSCULAR

## 2024-06-17 MED ORDER — BENZTROPINE 1 MG TABLET: 1.0000 mg | ORAL_TABLET | Freq: Three times a day (TID) | ORAL | 0 refills | Status: DC

## 2024-06-17 MED ORDER — DIVALPROEX 500 MG TABLET,DELAYED RELEASE: 500.0000 mg | DELAYED_RELEASE_TABLET | Freq: Two times a day (BID) | ORAL | 0 refills | Status: DC

## 2024-06-17 MED ORDER — HALOPERIDOL DECANOATE 100 MG/ML INTRAMUSCULAR SOLUTION: 100.0000 mg | INTRAMUSCULAR | 0 refills | Status: DC

## 2024-06-17 MED ORDER — HALOPERIDOL DECANOATE 100 MG/ML INTRAMUSCULAR SOLUTION
100.0000 mg | Freq: Once | INTRAMUSCULAR | Status: AC
  Administered 2024-06-17: 100 mg via INTRAMUSCULAR

## 2024-06-17 MED ORDER — HALOPERIDOL DECANOATE 50 MG/ML INTRAMUSCULAR SOLUTION: 50.0000 mg | INTRAMUSCULAR | 0 refills | Status: DC

## 2024-06-17 MED ORDER — CLONAZEPAM 0.5 MG TABLET: 0.5000 mg | ORAL_TABLET | Freq: Two times a day (BID) | ORAL | 0 refills | Status: DC | PRN

## 2024-06-17 NOTE — Telephone Encounter (Signed)
 Haldol  50 mg Injection  Haldol  100 mg injection  Dose 150 mg IM every 21 days.

## 2024-06-17 NOTE — Progress Notes (Signed)
 Pt in for office visit and haldol  150 mg IM injection. Pt supplied his own medication. 50 MG ADMINISTERED IN LEFT ARM BY CANDICE HUFFMAN, LPN AND 899 MG ADMINISTERED IN RIGHT ARM BY DEBORRA ROOKS ,RN. Pt tolerated injection without difficulty.

## 2024-06-17 NOTE — Progress Notes (Signed)
 BEHAVIORAL MEDICINE, BEHAVIORAL HEALTH CENTER  [address]  [place] [address]  Operated by [facility]    Name: Donald Atkins MRN:  [id number]   Date: 06/17/2024 DOB:  12-29-1985 (39 y.o.)       Chief Complaint: No chief complaint on file.    Subjective:   Patient reports that he is doing really well lately. Patient reports getting a bunny for Christmas, named Dexter.     Mood Good  Medications Working well. No ill effects.  Appetite Good. Reports gaining some weight.  Sleep Good  Energy A lot  Stressors Court on 06/27/24    Problem List[1]  Past Medical History[2]  Past Surgical History[3]  Family History[4]  Social History     Socioeconomic History    Marital status: Unknown   Tobacco Use    Smoking status: Never     Passive exposure: Never    Smokeless tobacco: Never   Vaping Use    Vaping status: Never Used   Substance and Sexual Activity    Alcohol use: Never    Drug use: Never    Sexual activity: Not Currently     Social Determinants of Health     Financial Resource Strain: Low Risk (05/16/2022)    Financial Resource Strain     SDOH Financial: No   Transportation Needs: No Transportation Needs (04/28/2024)    Received from [facility] - Transportation     In the past 12 months, has lack of transportation kept you from medical appointments or from getting medications?: No     In the past 12 months, has lack of transportation kept you from meetings, work, or from getting things needed for daily living?: No   Social Connections: Medium Risk (12/23/2023)    Social Connections     SDOH Social Isolation: 3 to 5 times a week   Intimate Partner Violence: Not At Risk (04/28/2024)    Received from [facility]    Humiliation, Afraid, Rape, and Kick questionnaire     Within the last year, have you been afraid of your partner or ex-partner?: No     Within the last year, have you been humiliated or emotionally abused in other ways by your partner or ex-partner?: No     Within  the last year, have you been kicked, hit, slapped, or otherwise physically hurt by your partner or ex-partner?: No     Within the last year, have you been raped or forced to have any kind of sexual activity by your partner or ex-partner?: No   Housing Stability: Unknown (04/28/2024)    Received from [facility] Stability Vital Sign     In the last 12 months, was there a time when you were not able to pay the mortgage or rent on time?: No     At any time in the past 12 months, were you homeless or living in a shelter (including now)?: No      Latex, Penicillins, and Latex   Current Outpatient Medications   Medication Sig    benztropine  (COGENTIN ) 1 mg Oral Tablet Take 1 Tablet (1 mg total) by mouth Three times a day for 30 days    Blunt Needle, Disposable 18 x 1 1/2  Needle 2 Each Every 30 days    busPIRone  (BUSPAR ) 10 mg Oral Tablet Take 1 Tablet (10 mg total) by mouth Three times a day    clonazePAM  (KLONOPIN ) 0.5 mg Oral  Tablet Take 1 Tablet (0.5 mg total) by mouth Twice per day as needed for up to 30 days    divalproex  (DEPAKOTE ) 500 mg Oral Tablet, Delayed Release (E.C.) Take 1 Tablet (500 mg total) by mouth Twice daily for 30 days    haloperidoL  (HALDOL ) 2 mg Oral Tablet Take 1 Tablet (2 mg total) by mouth Once per day as needed for Other for up to 30 days    haloperidol  decanoate (HALDOL  DECANOATE) 100 mg/mL IntraMUSCULAR Solution Inject 1 mL (100 mg total) into the muscle Every 21 days    haloperidol  decanoate (HALDOL  DECANOATE) 100 mg/mL IntraMUSCULAR Solution Inject 1 mL (100 mg total) into the muscle Every 21 days To be given with 50 mg to equal 150mg  monthly    haloperidol  decanoate (HALDOL  DECANOATE) 50 mg/mL IntraMUSCULAR Solution Inject 1 mL (50 mg total) into the muscle Every 28 days    haloperidol  decanoate (HALDOL  DECANOATE) 50 mg/mL IntraMUSCULAR Solution Inject 1 mL (50 mg total) into the muscle Every 21 days To be given with 100 mg to equal 150mg  monthly    hydrOXYzine  pamoate  (VISTARIL ) 25 mg Oral Capsule Take 1 Capsule (25 mg total) by mouth Four times a day as needed for Anxiety    montelukast (SINGULAIR) 10 mg Oral Tablet Take 1 Tablet (10 mg total) by mouth Every night    pantoprazole  (PROTONIX ) 40 mg Oral Tablet, Delayed Release (E.C.) Take 1 Tablet (40 mg total) by mouth Daily    Syringe with Needle, Disp, (SYRINGE 3CC/21GX1-1/2) 3 mL 21 gauge x 1 1/2 Syringe 2 Each Every 30 days        Objective :  There were no vitals taken for this visit.      PHQ Total Score                      04/10/2024     8:57 AM 05/14/2024    10:43 AM 06/17/2024     9:03 AM   Most Recent PHQ-9 Scores   PHQ 9 Total 15 0 0           Mental Status Exam  AXOX4. Casual dress, calm, well-groomed. No SI, HI, AVH, delusions, or paranoia. Thoughts are logical, coherent, and goal-directed. Good eye contact. Speech is normal in rate and tone. Mood is okay affect congruent. No psychomotor agitation or retardation, cogwheel rigidity, or abnormal movements. Gait is normal. Attention, concentration, and memory are good. No cognitive deficits noted. Judgment and insight are fair. Calculation and abstraction are within normal limits.     Data Reviewed  I have reviewed patient's previous note medical, surgical, family, and social history in detail today,     Assessment  Generalized Anxiety and Schizoaffective Disorder     Plan  Hydroxyzine  Pamoate 25 mg take 1 cap by mouth four times daily as needed for anxiety  Divalproex  DR 500 mg take 1 tab by mouth twice daily for mood stabilization  Haloperidol  2 mg take 1 tab by mouth daily as needed for hallucinations  Haloperidol  Decanoate 50 mg/ml Administer 150 mg IM every 21 days Due 04/10/24  Benztropine  1 mg by mouth three times daily for side effects of Haloperidol .  Buspirone  10 mg take 1 tab by mouth three times daily for increased anxiety.  Klonopin  0.5 mg take 1 tab by mouth twice daily as needed for anxiety.  Obtain Valproic  Acid level, CBC, CMP, LFT, lipid panel, and  HgB A1C      Follow up  Follow up in 4 weeks     [name], APRN, CNP        [1]   Patient Active Problem List  Diagnosis    Schizophrenia    GAD (generalized anxiety disorder)    Schizoaffective disorder   [2]   Past Medical History:  Diagnosis Date    Bipolar disorder, unspecified     Borderline intellectual functioning     Convulsions     Convulsions     GAD (generalized anxiety disorder)     GERD (gastroesophageal reflux disease)     Mixed hyperlipidemia     Schizoaffective disorder     Schizophrenia    [3] No past surgical history on file.  [4] No family history on file.

## 2024-07-08 ENCOUNTER — Ambulatory Visit (HOSPITAL_PSYCHIATRIC): Payer: MEDICAID

## 2024-07-08 ENCOUNTER — Ambulatory Visit: Payer: MEDICAID

## 2024-07-12 ENCOUNTER — Ambulatory Visit (HOSPITAL_PSYCHIATRIC): Payer: MEDICAID

## 2024-07-12 ENCOUNTER — Ambulatory Visit (HOSPITAL_PSYCHIATRIC): Payer: Self-pay

## 2024-07-12 ENCOUNTER — Other Ambulatory Visit: Payer: Self-pay

## 2024-07-12 ENCOUNTER — Encounter (HOSPITAL_PSYCHIATRIC): Payer: Self-pay

## 2024-07-12 ENCOUNTER — Ambulatory Visit: Payer: Self-pay

## 2024-07-12 VITALS — BP 123/77 | HR 75 | Resp 18 | Ht 73.0 in | Wt 185.0 lb

## 2024-07-12 DIAGNOSIS — F251 Schizoaffective disorder, depressive type: Secondary | ICD-10-CM

## 2024-07-12 DIAGNOSIS — F411 Generalized anxiety disorder: Secondary | ICD-10-CM

## 2024-07-12 DIAGNOSIS — F259 Schizoaffective disorder, unspecified: Secondary | ICD-10-CM | POA: Insufficient documentation

## 2024-07-12 MED ORDER — HALOPERIDOL DECANOATE 50 MG/ML INTRAMUSCULAR SOLUTION: 50.0000 mg | INTRAMUSCULAR | 0 refills | Status: DC

## 2024-07-12 MED ORDER — BUSPIRONE 10 MG TABLET: 10.0000 mg | ORAL_TABLET | Freq: Three times a day (TID) | ORAL | 0 refills | Status: AC

## 2024-07-12 MED ORDER — CLONAZEPAM 0.5 MG TABLET: 0.5000 mg | ORAL_TABLET | Freq: Two times a day (BID) | ORAL | 0 refills | Status: AC | PRN

## 2024-07-12 MED ORDER — HALOPERIDOL DECANOATE 100 MG/ML INTRAMUSCULAR SOLUTION
100.0000 mg | INTRAMUSCULAR | Status: AC
  Administered 2024-07-12: 0 mg via INTRAMUSCULAR

## 2024-07-12 MED ORDER — HALOPERIDOL DECANOATE 100 MG/ML INTRAMUSCULAR SOLUTION: 100.0000 mg | INTRAMUSCULAR | 1 refills | Status: DC

## 2024-07-12 MED ORDER — BLUNT NEEDLE, DISPOSABLE 18 X 1 1/2": 2.0000 | 1 refills | Status: AC

## 2024-07-12 MED ORDER — HALOPERIDOL 2 MG TABLET: 2.0000 mg | ORAL_TABLET | Freq: Every day | ORAL | 0 refills | Status: AC | PRN

## 2024-07-12 MED ORDER — BENZTROPINE 1 MG TABLET: 1.0000 mg | ORAL_TABLET | Freq: Three times a day (TID) | ORAL | 0 refills | Status: AC

## 2024-07-12 MED ORDER — HYDROXYZINE PAMOATE 25 MG CAPSULE: 25.0000 mg | ORAL_CAPSULE | Freq: Four times a day (QID) | ORAL | 0 refills | Status: AC | PRN

## 2024-07-12 MED ORDER — DIVALPROEX 500 MG TABLET,DELAYED RELEASE: 500.0000 mg | DELAYED_RELEASE_TABLET | Freq: Two times a day (BID) | ORAL | 0 refills | Status: AC

## 2024-07-12 MED ORDER — SYRINGE WITH NEEDLE 3 ML 21 GAUGE X 1 1/2": 2.0000 | INJECTION | 1 refills | Status: AC

## 2024-07-12 MED ORDER — HALOPERIDOL DECANOATE 100 MG/ML INTRAMUSCULAR SOLUTION: 100.0000 mg | INTRAMUSCULAR | 0 refills | Status: DC

## 2024-07-12 MED ORDER — HALOPERIDOL DECANOATE 50 MG/ML INTRAMUSCULAR SOLUTION
100.0000 mg | INTRAMUSCULAR | Status: DC
  Administered 2024-07-12: 100 mg via INTRAMUSCULAR

## 2024-07-12 NOTE — Progress Notes (Signed)
 Haldol  150mg  injection. 50 mg given in right arm by Randine Ned LPN. 100 mg given in Left arm by [facility] LPN. Patient tolerated well. Patient supplied own medications. Do Not Bill.

## 2024-07-12 NOTE — Progress Notes (Signed)
 [facility]  [address]  [place] [address]  Operated by [facility]    Name: Donald Atkins MRN:  [id number]   Date: 07/12/2024 DOB:  05-Jan-1986 (39 y.o.)       Chief Complaint: Bipolar Disorder and Generalized Anxiety    Subjective:   Patient reports things has been going good. Reports that his charges has been dismissed.     Mood Good. I'm happy today.  Medications Working well. No ill effects.  Appetite Good  Sleep Good  Energy Lots of energy because I haven't been able to get out of the house.  Stressors Denies    Problem List[1]  Past Medical History[2]  Past Surgical History[3]  Family History[4]  Social History     Socioeconomic History    Marital status: Unknown   Tobacco Use    Smoking status: Never     Passive exposure: Never    Smokeless tobacco: Never   Vaping Use    Vaping status: Never Used   Substance and Sexual Activity    Alcohol use: Never    Drug use: Never    Sexual activity: Not Currently     Social Determinants of Health     Financial Resource Strain: Low Risk (05/16/2022)    Financial Resource Strain     SDOH Financial: No   Transportation Needs: No Transportation Needs (04/28/2024)    Received from [facility] - Transportation     In the past 12 months, has lack of transportation kept you from medical appointments or from getting medications?: No     In the past 12 months, has lack of transportation kept you from meetings, work, or from getting things needed for daily living?: No   Social Connections: Medium Risk (12/23/2023)    Social Connections     SDOH Social Isolation: 3 to 5 times a week   Intimate Partner Violence: Not At Risk (04/28/2024)    Received from [facility]    Humiliation, Afraid, Rape, and Kick questionnaire     Within the last year, have you been afraid of your partner or ex-partner?: No     Within the last year, have you been humiliated or emotionally abused in other ways by your partner  or ex-partner?: No     Within the last year, have you been kicked, hit, slapped, or otherwise physically hurt by your partner or ex-partner?: No     Within the last year, have you been raped or forced to have any kind of sexual activity by your partner or ex-partner?: No   Housing Stability: Unknown (04/28/2024)    Received from [facility] Stability Vital Sign     In the last 12 months, was there a time when you were not able to pay the mortgage or rent on time?: No     At any time in the past 12 months, were you homeless or living in a shelter (including now)?: No      Latex, Penicillins, and Latex   Current Outpatient Medications   Medication Sig    benztropine  (COGENTIN ) 1 mg Oral Tablet Take 1 Tablet (1 mg total) by mouth Three times a day for 30 days    Blunt Needle, Disposable 18 x 1 1/2  Needle 2 Each Every 30 days    busPIRone  (BUSPAR ) 10 mg Oral Tablet Take 1 Tablet (10 mg total) by mouth Three times a day  clonazePAM  (KLONOPIN ) 0.5 mg Oral Tablet Take 1 Tablet (0.5 mg total) by mouth Twice per day as needed for up to 30 days    divalproex  (DEPAKOTE ) 500 mg Oral Tablet, Delayed Release (E.C.) Take 1 Tablet (500 mg total) by mouth Twice daily for 30 days    haloperidoL  (HALDOL ) 2 mg Oral Tablet Take 1 Tablet (2 mg total) by mouth Once per day as needed for Other for up to 30 days    haloperidol  decanoate (HALDOL  DECANOATE) 100 mg/mL IntraMUSCULAR Solution Inject 1 mL (100 mg total) into the muscle Every 21 days To be given with 50 mg to equal 150mg  monthly    haloperidol  decanoate (HALDOL  DECANOATE) 100 mg/mL IntraMUSCULAR Solution Inject 1 mL (100 mg total) into the muscle Every 21 days To be given with 50 mg to equal total dose of 150 mg IM    haloperidol  decanoate (HALDOL  DECANOATE) 50 mg/mL IntraMUSCULAR Solution Inject 1 mL (50 mg total) into the muscle Every 28 days    haloperidol  decanoate (HALDOL  DECANOATE) 50 mg/mL IntraMUSCULAR Solution Inject 1 mL (50 mg total) into the muscle  Every 21 days To be given with 100 mg to equal 150mg  every 21 days    hydrOXYzine  pamoate (VISTARIL ) 25 mg Oral Capsule Take 1 Capsule (25 mg total) by mouth Four times a day as needed for Anxiety    montelukast (SINGULAIR) 10 mg Oral Tablet Take 1 Tablet (10 mg total) by mouth Every night    pantoprazole  (PROTONIX ) 40 mg Oral Tablet, Delayed Release (E.C.) Take 1 Tablet (40 mg total) by mouth Daily    Syringe with Needle, Disp, (SYRINGE 3CC/21GX1-1/2) 3 mL 21 gauge x 1 1/2 Syringe 2 Each Every 30 days        Objective :  BP 123/77 (Site: Left Arm, Patient Position: Sitting)   Pulse 75   Resp 18   Ht 1.854 m (6' 1)   Wt 83.9 kg (185 lb)   BMI 24.41 kg/m       PHQ Total Score  PHQ 2 Total: 0  PHQ 9 Total: 0  Interpretation of Total Score: 0-4 No depression             05/14/2024    10:43 AM 06/17/2024     9:03 AM 07/12/2024     9:36 AM   Most Recent PHQ-9 Scores   PHQ 9 Total 0 0 0           Mental Status Exam  AXOX4. Casual dress, calm, well-groomed. No SI, HI, AVH, delusions, or paranoia. Thoughts are logical, coherent, and goal-directed. Good eye contact. Speech is normal in rate and tone. Mood is okay affect congruent. No psychomotor agitation or retardation, cogwheel rigidity, or abnormal movements. Gait is normal. Attention, concentration, and memory are good. No cognitive deficits noted. Judgment and insight are fair. Calculation and abstraction are within normal limits.     Data Reviewed  I have reviewed patient's previous note medical, surgical, family, and social history in detail today,     Assessment  Generalized Anxiety and Schizoaffective Disorder     Plan  Hydroxyzine  Pamoate 25 mg take 1 cap by mouth four times daily as needed for anxiety  Divalproex  DR 500 mg take 1 tab by mouth twice daily for mood stabilization  Haloperidol  2 mg take 1 tab by mouth daily as needed for hallucinations  Haloperidol  Decanoate 50 mg/ml Administer 150 mg IM every 21 days Due 04/10/24  Benztropine  1 mg by mouth  three times daily for side effects of Haloperidol .  Buspirone  10 mg take 1 tab by mouth three times daily for increased anxiety.  Klonopin  0.5 mg take 1 tab by mouth twice daily as needed for anxiety.        Follow up  Follow up in 4 weeks        [name], APRN, CNP        [1]   Patient Active Problem List  Diagnosis    Schizophrenia    GAD (generalized anxiety disorder)    Schizoaffective disorder   [2]   Past Medical History:  Diagnosis Date    Bipolar disorder, unspecified     Borderline intellectual functioning     Convulsions     Convulsions     GAD (generalized anxiety disorder)     GERD (gastroesophageal reflux disease)     Mixed hyperlipidemia     Schizoaffective disorder     Schizophrenia    [3] No past surgical history on file.  [4] No family history on file.

## 2024-08-02 ENCOUNTER — Ambulatory Visit (HOSPITAL_PSYCHIATRIC): Payer: Self-pay

## 2024-08-09 ENCOUNTER — Ambulatory Visit (HOSPITAL_PSYCHIATRIC): Payer: Self-pay
